# Patient Record
Sex: Female | Born: 1975 | Race: White | Hispanic: No | Marital: Married | Smoking: Never smoker
Health system: Southern US, Community
[De-identification: ages and names within clinical notes are randomized; demographics above are authoritative.]

## PROBLEM LIST (undated history)

## (undated) DIAGNOSIS — R55 Syncope and collapse: Secondary | ICD-10-CM

## (undated) DIAGNOSIS — M549 Dorsalgia, unspecified: Secondary | ICD-10-CM

## (undated) DIAGNOSIS — N189 Chronic kidney disease, unspecified: Secondary | ICD-10-CM

## (undated) DIAGNOSIS — Z9889 Other specified postprocedural states: Secondary | ICD-10-CM

## (undated) DIAGNOSIS — N135 Crossing vessel and stricture of ureter without hydronephrosis: Secondary | ICD-10-CM

## (undated) DIAGNOSIS — Z8042 Family history of malignant neoplasm of prostate: Secondary | ICD-10-CM

## (undated) DIAGNOSIS — Z803 Family history of malignant neoplasm of breast: Secondary | ICD-10-CM

## (undated) DIAGNOSIS — F410 Panic disorder [episodic paroxysmal anxiety] without agoraphobia: Secondary | ICD-10-CM

## (undated) DIAGNOSIS — Z923 Personal history of irradiation: Secondary | ICD-10-CM

## (undated) DIAGNOSIS — Z801 Family history of malignant neoplasm of trachea, bronchus and lung: Secondary | ICD-10-CM

## (undated) DIAGNOSIS — T8859XA Other complications of anesthesia, initial encounter: Secondary | ICD-10-CM

## (undated) DIAGNOSIS — R002 Palpitations: Secondary | ICD-10-CM

## (undated) DIAGNOSIS — R000 Tachycardia, unspecified: Secondary | ICD-10-CM

## (undated) DIAGNOSIS — Z800 Family history of malignant neoplasm of digestive organs: Secondary | ICD-10-CM

## (undated) DIAGNOSIS — O1495 Unspecified pre-eclampsia, complicating the puerperium: Secondary | ICD-10-CM

## (undated) DIAGNOSIS — Z8051 Family history of malignant neoplasm of kidney: Secondary | ICD-10-CM

## (undated) HISTORY — DX: Personal history of irradiation: Z92.3

## (undated) HISTORY — DX: Crossing vessel and stricture of ureter without hydronephrosis: N13.5

## (undated) HISTORY — DX: Family history of malignant neoplasm of trachea, bronchus and lung: Z80.1

## (undated) HISTORY — PX: WISDOM TOOTH EXTRACTION: SHX21

## (undated) HISTORY — DX: Family history of malignant neoplasm of prostate: Z80.42

## (undated) HISTORY — DX: Dorsalgia, unspecified: M54.9

## (undated) HISTORY — DX: Family history of malignant neoplasm of breast: Z80.3

## (undated) HISTORY — DX: Tachycardia, unspecified: R00.0

## (undated) HISTORY — DX: Family history of malignant neoplasm of digestive organs: Z80.0

## (undated) HISTORY — DX: Panic disorder (episodic paroxysmal anxiety): F41.0

## (undated) HISTORY — DX: Palpitations: R00.2

## (undated) HISTORY — DX: Family history of malignant neoplasm of kidney: Z80.51

## (undated) HISTORY — DX: Syncope and collapse: R55

## (undated) HISTORY — DX: Unspecified pre-eclampsia, complicating the puerperium: O14.95

---

## 1998-11-11 ENCOUNTER — Ambulatory Visit (HOSPITAL_COMMUNITY): Admission: RE | Admit: 1998-11-11 | Discharge: 1998-11-11 | Payer: Self-pay | Admitting: Internal Medicine

## 1998-11-11 ENCOUNTER — Encounter: Payer: Self-pay | Admitting: Internal Medicine

## 1999-03-16 ENCOUNTER — Other Ambulatory Visit: Admission: RE | Admit: 1999-03-16 | Discharge: 1999-03-16 | Payer: Self-pay | Admitting: *Deleted

## 1999-08-14 ENCOUNTER — Ambulatory Visit (HOSPITAL_COMMUNITY): Admission: RE | Admit: 1999-08-14 | Discharge: 1999-08-14 | Payer: Self-pay | Admitting: Neurology

## 1999-08-14 ENCOUNTER — Encounter: Payer: Self-pay | Admitting: Neurology

## 2000-05-13 ENCOUNTER — Other Ambulatory Visit: Admission: RE | Admit: 2000-05-13 | Discharge: 2000-05-13 | Payer: Self-pay | Admitting: Obstetrics & Gynecology

## 2000-12-13 ENCOUNTER — Ambulatory Visit (HOSPITAL_COMMUNITY): Admission: RE | Admit: 2000-12-13 | Discharge: 2000-12-13 | Payer: Self-pay | Admitting: Internal Medicine

## 2000-12-13 ENCOUNTER — Encounter: Payer: Self-pay | Admitting: Internal Medicine

## 2001-08-01 ENCOUNTER — Other Ambulatory Visit: Admission: RE | Admit: 2001-08-01 | Discharge: 2001-08-01 | Payer: Self-pay | Admitting: Obstetrics and Gynecology

## 2002-10-26 ENCOUNTER — Other Ambulatory Visit: Admission: RE | Admit: 2002-10-26 | Discharge: 2002-10-26 | Payer: Self-pay | Admitting: Obstetrics and Gynecology

## 2003-10-05 ENCOUNTER — Encounter: Admission: RE | Admit: 2003-10-05 | Discharge: 2003-10-05 | Payer: Self-pay | Admitting: Internal Medicine

## 2003-10-27 ENCOUNTER — Other Ambulatory Visit: Admission: RE | Admit: 2003-10-27 | Discharge: 2003-10-27 | Payer: Self-pay | Admitting: Obstetrics and Gynecology

## 2004-10-30 ENCOUNTER — Other Ambulatory Visit: Admission: RE | Admit: 2004-10-30 | Discharge: 2004-10-30 | Payer: Self-pay | Admitting: Obstetrics and Gynecology

## 2004-12-22 ENCOUNTER — Ambulatory Visit: Payer: Self-pay | Admitting: Internal Medicine

## 2005-01-01 HISTORY — PX: US ECHOCARDIOGRAPHY: HXRAD669

## 2005-02-13 ENCOUNTER — Encounter: Admission: RE | Admit: 2005-02-13 | Discharge: 2005-02-13 | Payer: Self-pay | Admitting: Obstetrics and Gynecology

## 2005-12-21 ENCOUNTER — Other Ambulatory Visit: Admission: RE | Admit: 2005-12-21 | Discharge: 2005-12-21 | Payer: Self-pay | Admitting: Obstetrics and Gynecology

## 2007-03-21 ENCOUNTER — Encounter: Admission: RE | Admit: 2007-03-21 | Discharge: 2007-03-21 | Payer: Self-pay | Admitting: Internal Medicine

## 2008-02-22 ENCOUNTER — Inpatient Hospital Stay (HOSPITAL_COMMUNITY): Admission: AD | Admit: 2008-02-22 | Discharge: 2008-02-22 | Payer: Self-pay | Admitting: Obstetrics and Gynecology

## 2008-03-25 ENCOUNTER — Inpatient Hospital Stay (HOSPITAL_COMMUNITY): Admission: AD | Admit: 2008-03-25 | Discharge: 2008-03-27 | Payer: Self-pay | Admitting: Obstetrics and Gynecology

## 2008-04-15 ENCOUNTER — Inpatient Hospital Stay (HOSPITAL_COMMUNITY): Admission: AD | Admit: 2008-04-15 | Discharge: 2008-04-18 | Payer: Self-pay | Admitting: Obstetrics and Gynecology

## 2008-04-19 ENCOUNTER — Inpatient Hospital Stay (HOSPITAL_COMMUNITY): Admission: AD | Admit: 2008-04-19 | Discharge: 2008-04-22 | Payer: Self-pay | Admitting: Obstetrics and Gynecology

## 2008-05-05 ENCOUNTER — Ambulatory Visit (HOSPITAL_BASED_OUTPATIENT_CLINIC_OR_DEPARTMENT_OTHER): Admission: RE | Admit: 2008-05-05 | Discharge: 2008-05-05 | Payer: Self-pay | Admitting: Urology

## 2008-05-07 ENCOUNTER — Ambulatory Visit: Admission: RE | Admit: 2008-05-07 | Discharge: 2008-05-07 | Payer: Self-pay | Admitting: Obstetrics and Gynecology

## 2008-05-12 ENCOUNTER — Ambulatory Visit (HOSPITAL_COMMUNITY): Admission: RE | Admit: 2008-05-12 | Discharge: 2008-05-12 | Payer: Self-pay | Admitting: Urology

## 2008-05-26 HISTORY — PX: KIDNEY SURGERY: SHX687

## 2008-06-24 ENCOUNTER — Inpatient Hospital Stay (HOSPITAL_COMMUNITY): Admission: RE | Admit: 2008-06-24 | Discharge: 2008-06-26 | Payer: Self-pay | Admitting: Urology

## 2008-06-24 ENCOUNTER — Encounter (INDEPENDENT_AMBULATORY_CARE_PROVIDER_SITE_OTHER): Payer: Self-pay | Admitting: Urology

## 2008-09-27 ENCOUNTER — Ambulatory Visit (HOSPITAL_COMMUNITY): Admission: RE | Admit: 2008-09-27 | Discharge: 2008-09-27 | Payer: Self-pay | Admitting: Urology

## 2009-06-20 ENCOUNTER — Ambulatory Visit (HOSPITAL_COMMUNITY): Admission: RE | Admit: 2009-06-20 | Discharge: 2009-06-20 | Payer: Self-pay | Admitting: Urology

## 2009-08-27 ENCOUNTER — Ambulatory Visit: Payer: Self-pay | Admitting: Family Medicine

## 2009-08-27 DIAGNOSIS — J0100 Acute maxillary sinusitis, unspecified: Secondary | ICD-10-CM | POA: Insufficient documentation

## 2010-06-28 ENCOUNTER — Ambulatory Visit (HOSPITAL_COMMUNITY): Admission: RE | Admit: 2010-06-28 | Discharge: 2010-06-28 | Payer: Self-pay | Admitting: Urology

## 2010-12-18 ENCOUNTER — Encounter: Payer: Self-pay | Admitting: Urology

## 2011-04-10 NOTE — Discharge Summary (Signed)
Destiny Mitchell, Destiny Mitchell              ACCOUNT NO.:  [id number]   MEDICAL RECORD NO.:  [id number]          PATIENT TYPE:  INP   LOCATION:  9317                          FACILITY:  [facility]   PHYSICIAN:  Zenaida Niece, M.D.DATE OF BIRTH:  [birth year]   DATE OF ADMISSION:  04/15/2008  DATE OF DISCHARGE:  04/18/2008                               DISCHARGE SUMMARY   ADMISSION DIAGNOSES:  1. Intrauterine pregnancy at 34+ weeks'.  2. Gestational hypertension.   DISCHARGE DIAGNOSES:  1. Intrauterine pregnancy at 34+ weeks'.  2. Gestational hypertension.   PROCEDURES:  On Apr 16, 2008, she had a spontaneous vaginal delivery.   HISTORY AND PHYSICAL:  This is a 35 year old white female gravida 1,  para 0 with an EGA of 34+ weeks, who was seen in the office on the day  of admission and had slightly elevated blood pressures without  significant symptoms and no proteinuria.  She was evaluated in triage  and blood pressures remained mildly elevated 150s/110s.  Labs were  normal.  Dr. Ellyn Hack evaluated the patient and recommended induction.  Prenatal care has been complicated by preterm contractions for which she  has been treated with p.r.n. terbutaline and received betamethasone at  the end of April and early May.  Prenatal labs blood type is A+ with a  negative antibody screen, gonorrhea and chlamydia negative, RPR  nonreactive, rubella immune, hepatitis B surface antigen negative, HIV  negative, 1-hour Glucola 99, quad screen is normal, cystic fibrosis  negative, group B strep is negative.   PAST MEDICAL HISTORY:  Significant for panic attacks and syncope.   PAST SURGICAL HISTORY:  Wisdom tooth removal.  The remainder of the  history is noncontributory.   PHYSICAL EXAMINATION:  Blood pressure is 150/110.  The remainder of the  vitals were stable.  Fetal heart tracing is 160s and reactive.  Abdomen,  gravid, nontender.  Cervix is 4-5, 80, 0 to -1, and Dr. Ellyn Hack was able  to rupture membranes  which revealed clear fluid.   HOSPITAL COURSE:  The patient was admitted by Dr. Ellyn Hack and had  membranes ruptured for induction.  She progressed into labor, progressed  to complete, pushed well, and early on the morning of Apr 16, 2008, had  a vaginal delivery of a viable female infant with Apgars of  8 and 9 that  weighed 5 pounds 9 ounces.  The NICU team was in attendance due to  prematurity.  Placenta delivered spontaneously and was intact.  She had  a first-degree perineal laceration repaired with 3-0 Vicryl and  estimated blood loss was less than 500 mL.  Postpartum, she had no  significant complications.  Blood pressures remained mildly elevated but  did gradually improve.  Predelivery hemoglobin 13.1, postdelivery 12.4.  Predelivery platelets 124,000, postdelivery 197,000.  On postpartum #2,  she was felt to be stable enough for discharge home.  Her baby was in  the NICU and doing well.   DISCHARGE INSTRUCTIONS:  Regular diet, pelvic rest, follow-up is in 6  days to check her blood pressure.  Medications are over-the-counter  ibuprofen as needed and  she is given our discharge pamphlet.      Zenaida Niece, M.D.  Electronically Signed     TDM/MEDQ  D:  04/18/2008  T:  04/18/2008  Job:  [id number]

## 2011-04-10 NOTE — Discharge Summary (Signed)
NAMELAIBA, FUERTE NO.:  [id number]   MEDICAL RECORD NO.:  [id number]          PATIENT TYPE:  INP   LOCATION:  9317                          FACILITY:  [facility]   PHYSICIAN:  Malachi Pro. Ambrose Mantle, M.D. DATE OF BIRTH:  1976-01-26   DATE OF ADMISSION:  04/15/2008  DATE OF DISCHARGE:  04/18/2008                               DISCHARGE SUMMARY   This is a 35 year old white married female, para 0-1-0-1, admitted with  preeclampsia.  This lady was admitted Apr 15, 2008, with increased blood  pressure, mild headache, and had labor induced.  She delivered a 5-pound  9-ounce female infant.  Labor did not include magnesium sulfate.  Labs  were normal.  SGOT and PT were 20 and 19 respectively.  Postpartum, she  did well and was discharged on postpartum day #2 with blood pressures  130-150/80-100.  On the afternoon of admission, she awoke from a nap and  had some frontal headache and some pain in her neck.  Her blood pressure  was 152/109, so she came to maternity admission for evaluation.  In the  MAU, her blood pressures were 152/102, 172/111, 160/95, 157/107, and  155/97.  Cath urine was negative for protein, platelet was 270,000, uric  acid was 5.7, SGOT and PT was 61 and 67 respectively.   PAST MEDICAL HISTORY:  No known allergies.  No operations, illnesses,  panic attacks, or syncope.   FAMILY HISTORY:  Father with an MI and high blood pressure.  Mother with  kidney and lung cancer, anxiety and depression.  Alcohol, tobacco, and  drugs none.   PHYSICAL EXAMINATION:  Her vital signs are temperature 98.1, blood  pressures as recorded, pulse 92, respirations 16.  HEART:  Normal sinus rhythm, no murmurs.  LUNGS: Clear to auscultation.  ABDOMEN:  Soft.  There was a mass below the left costal margin.  Fundus  was about 8 cm above the pubis.  LEGS:  Negative.  Deep deep tendon reflexes at the knees were 3+ with  possibly 1-2 beats of clonus.  PELVIC:  Deferred.   Impressions  on admission were postpartum preeclampsia, left upper  quadrant mass.  The patient was placed on intravenous magnesium sulfate.  An abdominal ultrasound was ordered and we would treat with labetalol if  the blood pressure did not come down.  The nurse noted extremely brisk  reflexes at approximately 1 a.m. on Apr 20, 2008.  An additional 2 g  bolus of magnesium sulfate was given and the magnesium sulfate was  continued 2 g an hour.  On the morning of Apr 20, 2008, the magnesium  level was 7.6, SGOT and PT were 58 and 71 respectively.  Platelets were  261, uric acid 6.3.  Labetalol 100 mg twice a day was started.  Abdominal ultrasound showed a liver and spleen normal.  The left kidney  was hydronephrotic.  Largest cyst was 8 cm compatible with the  ureteropelvic junction obstruction.  I asked Dr. Isabel Caprice to see the  patient.  He asked me to get a CT scan of the abdomen and pelvis.  The  patient tolerated the magnesium sulfate relatively poorly seeing double  and some nausea and vomiting.  Her blood pressures did markedly improve  with the magnesium sulfate and labetalol.  Her magnesium sulfate was  decreased to 1 g an hour.  Dr. Isabel Caprice saw the patient, said she had a  left hydronephrosis with a ureteropelvic junction obstruction.  He  recommended that after discharge she undergo a retrograde pyelogram with  insertion of a double-J stent followed by renal scan to check the  function.  He stated that he would arrange for it to be done next week.  On Apr 21, 2008, the patient's SGOT was 35, SGPT was 55.  All blood  pressures were normal and the mag level was 7.0.  Magnesium sulfate was  discontinued and she was transferred to the floor.  Thereafter, her  blood pressures remained slightly elevated.  The highest diastolic was  in the mid 90s.  Some of them were completely normal.  The patient  requests a final PIH panel prior to discharge.  The CT scan of the  abdomen with contrast showed massive  hydronephrosis on the left of  uncertain etiology.  It had developed since March 21, 2007, and she had  a CT scan.  No obstructing calculus or abdominal masses were identified.  The obstruction did not extend into the left ureter.  Initial hemoglobin  12.6, hematocrit 36.1, white count 10,500, platelet count 270,000.  Follow-up CBCs were in the same range.  Creatinines were 0.89, 0.94, and  0.9.  Estimated glomerular filtration rate was greater than 60 on all  determinations.  SGOT was 61, 58, and 35; SGPT 67, 71, and 55.  Magnesium levels were 6.9, 7.6, 7.2, and 7.0.  Urinalysis was negative  for protein, pH was 6.5, and specific gravity of 1.010.   FINAL DIAGNOSES:  1. Postpartum preeclampsia.  2. Left renal hydronephrosis, probably secondary to ureteropelvic      junction obstruction.   DISCHARGE MEDICATIONS:  Labetalol 100 mg twice a day.  The patient is  advised to take her blood pressure twice daily in the afternoon and in  the evening, report any significant elevation for more than 150/100, at  which time if it does go higher, we would repeat her labs and increase  her labetalol.  She is to follow up with Dr. Isabel Caprice regarding the  ureteropelvic junction obstruction and come back to our office in two  weeks for followup examination.      Malachi Pro. Ambrose Mantle, M.D.  Electronically Signed     TFH/MEDQ  D:  04/22/2008  T:  04/22/2008  Job:  [id number]   cc:   Valetta Fuller, M.D.  Fax: [phone]

## 2011-04-10 NOTE — Op Note (Signed)
Destiny Mitchell, CERINO NO.:  [id number]   MEDICAL RECORD NO.:  [id number]           PATIENT TYPE:   LOCATION:                                 FACILITY:   PHYSICIAN:  Valetta Fuller, M.D.  DATE OF BIRTH:  04-30-1976   DATE OF PROCEDURE:  DATE OF DISCHARGE:                               OPERATIVE REPORT   PREOPERATIVE DIAGNOSES:  1. Left hydronephrosis.  2. Left UPJ (ureteropelvic junction) obstruction.   POSTOPERATIVE DIAGNOSES:  1. Left hydronephrosis.  2. Left UPJ (ureteropelvic junction) obstruction.   PROCEDURE PERFORMED:  Cystoscopy, left retrograde pyelogram, left  ureteroscopy and left double J stent placement.   SURGEON:  Valetta Fuller, M.D.   ANESTHESIA:  General.   INDICATIONS FOR PROCEDURE:  Ms. Destiny Mitchell is a 35 year old female.  She was  admitted recently to [facility] with some preeclampsia.  She had  had a headache after she had been induced for delivery.  As part of her  assessment she underwent physical exam which revealed a fullness/mass in  the left upper quadrant of her abdomen.  An ultrasound showed a markedly  swollen left kidney with severe hydronephrosis.  Renal function was  normal.  The patient had no abdominal or flank pain.  The patient  subsequently had a CT of the abdomen and pelvis.  This showed a poorly  functioning left renal unit with a markedly dilated pelvis and calyceal  system and what appeared to be a normal ureter suggesting ureteropelvic  junction obstruction.  Our plan was to allow her situation to stabilize  and then as an outpatient bring her in for further assessment of this  and she presents now for that.  She appeared to understand the  advantages and disadvantages of that.  Her blood pressure has been under  good control on labetalol.   TECHNIQUE AND FINDINGS:  The patient was brought to the operating room  where she had successful induction of general anesthesia.  She was  placed in lithotomy position  and prepped and draped in the usual manner.  Cystoscopically the bladder was unremarkable.  She had a retrograde  pyelogram performed of the left renal unit.  This showed a normal  caliber ureter up to the level of ureteropelvic junction where there was  marked high-grade obstruction.  One could see a jet effect of contrast  entering a markedly dilated renal pelvis with what appeared to be a high  insertion UPJ obstruction.  A guidewire was able to be placed past this  into the dilated renal pelvis.  We then performed ureteroscopy to rule  out any intrinsic process.  We got all the way up to the ureteropelvic  junction where there appeared to be marked narrowing and significant  stenosis.  We were unable to advance the scope beyond this  point.  I saw no evidence of stone, tumor or other pathology.  Over the  guidewire we then placed a 7 French 24 cm stent which was confirmed to  be in good position.  There were no obvious complications or problems.  The patient appeared to  tolerate the procedure well and was brought to  the recovery room in stable condition.           ______________________________  Valetta Fuller, M.D.  Electronically Signed     DSG/MEDQ  D:  05/05/2008  T:  05/05/2008  Job:  [id number]   cc:   Malachi Pro. Ambrose Mantle, M.D.  Fax: [phone]

## 2011-04-10 NOTE — Op Note (Signed)
NAMEBERNEDETTE, Destiny Mitchell NO.:  [id number]   MEDICAL RECORD NO.:  [id number]          PATIENT TYPE:  INP   LOCATION:  1405                         FACILITY:  [facility]   PHYSICIAN:  Heloise Purpura, MD      DATE OF BIRTH:  [birth year]   DATE OF PROCEDURE:  06/24/2008  DATE OF DISCHARGE:                               OPERATIVE REPORT   PREOPERATIVE DIAGNOSIS:  Left ureteropelvic junction obstruction.   POSTOPERATIVE DIAGNOSIS:  Left ureteropelvic junction obstruction.   PROCEDURE:  1. Cystoscopy.  2. Left retrograde pyelography.  3. Left ureteral stent placement (8 x 26).  4. Left robotic-assisted laparoscopic dismembered pyeloplasty.   SURGEON:  Dr. Heloise Purpura.   ASSISTANT:  Dr. Delman Kitten.   ANESTHESIA:  General.   COMPLICATIONS:  None.   ESTIMATED BLOOD LOSS:  50 mL.   SPECIMEN:  Left ureteropelvic junction.   DISPOSITION OF SPECIMENS:  To pathology.   DRAINS:  1. A #15 Blake perinephric drain.  2. A 16-French Foley catheter.   INDICATIONS:  Ms. Davidow the 35 year old female who was incidentally  found to have left-sided hydronephrosis on an imaging study.  She was  subsequently evaluated by Dr. Isabel Caprice and found to have a left  ureteropelvic junction obstruction with significantly decreased left  relative renal function.  She did appear to have enough function that  would indicate renal salvage, and after discussion regarding management  options for treatment, she elected to proceed with the above procedures.  Potential risks, complications, and alternative options associated with  the above procedures were discussed with the patient in detail, and  informed consent was obtained.   DESCRIPTION OF PROCEDURE:  The patient was taken to the operating room,  and a general anesthetic was administered.  She was given preoperative  antibiotics, placed in the dorsal lithotomy position, prepped and draped  in the usual sterile fashion.  Next, a preoperative  time-out was  performed.  Cystourethroscopy was performed which demonstrated no  abnormalities within the urethra or bladder.  The ureteral orifices were  in their normal anatomic position.  The left ureteral orifice was then  identified and intubated with a 6-French ureteral catheter.  Contrast  was injected which demonstrated a normal caliber left ureter without  evidence for filling defects.  No contrast was seen to enter the renal  pelvis, however.  A 0.038 sensor guidewire was then advanced up the  ureter under fluoroscopic guidance and was easily advanced into the  renal pelvis.  The ureteral catheter was then advanced over the wire up  to the proximal ureter, and contrast was again injected.  This  demonstrated significant obstruction with very little contrast entering  the renal pelvis.  The wire was then again reinserted up into the renal  pelvis, and the ureteral catheter was advanced over the wire into the  renal pelvis, and contrast was injected to fill out the renal pelvis.  This demonstrated a significantly dilated left renal pelvis with an  abrupt and clear obstruction at the level of the UPJ consistent with her  diagnosis of a ureteropelvic junction obstruction.  It was felt that  based on the images a crossing vessel could be an extrinsic cause of  obstruction.  At this point, the 0.038 sensor guidewire was re-advanced  into the renal pelvis, and the ureteral catheter was removed.  An 8 x 26  double-J ureteral stent was then advanced over the wire using Seldinger  technique and appropriately positioned under fluoroscopic and  cystoscopic guidance.  The wire was removed with a good curl noted in  the renal pelvis as well as in the bladder.  A 16-French Foley catheter  was then placed.  The patient was then repositioned in the left modified  flank position with care to pad all potential pressure points.  Another  preoperative time-out was performed, and the patient's abdomen  was  prepped and draped in the usual sterile fashion.  A site was selected  just superior to the umbilicus in the midline for placement of the  camera port.  This was placed using a standard open Hassan technique  which allowed entry into the peritoneal cavity under direct vision  without difficulty.  A 12-mm port was then placed, and a  pneumoperitoneum was established.  The abdomen was inspected with a 0  degrees lens, and there was no evidence for any intra-abdominal injuries  or other abnormalities.  The 8-mm robotic ports were then placed in the  left upper quadrant, left lower quadrant, and far lateral left lower  quadrant.  An additional 12-mm port was placed in the lower midline for  laparoscopic assistance.  The surgical cart was then docked.  With the  aid of the cautery scissors, the white line of Toldt was incised along  the length of the descending colon allowing the colon to be mobilized  medially and exposing the space between the mesocolon and anterior layer  of Gerota's fascia.  The patient was noted to have very little  mesenteric as well as retroperitoneal fat.  During this dissection, a  small inadvertent mesenteric window was created, and this was repaired  with a running 4-0 Vicryl suture.  Once the retroperitoneum was exposed,  the gonadal vein and ureter were identified.  The ureter was lifted off  the psoas muscle, and there was noted to be a dense desmoplastic  reaction surrounding the ureter and subsequently the renal pelvis.  The  ureter was carefully dissected free with care to preserve the  periureteral blood supply and dissection proceeded superiorly.  A clear  and obvious crossing renal artery and vein were identified extending  toward the lower pole of the kidney.  The kidney itself was noted to be  markedly abnormal with a significantly dilated and floppy renal pelvis.  With a combination of sharp, blunt, and cautery dissection, the renal  pelvis was  carefully separated from the surrounding tissues until it was  isolated up to the level of the main renal hilum.  It was completely  freed posteriorly as well as anteriorly and medially, allowing clear  identification of the patient's anatomical variant.  The crossing renal  vessels were separated from the collecting system and ureter, and at  this point, the ureter was divided just below the level of the UPJ.  The  ureter was then spatulated on its lateral side, and the ureteral stent  was removed from the renal pelvis.  After the ureter was completely  disarticulated from the renal pelvis, the renal pelvis was examined.  A  segment of the ureteropelvic junction that did appear to be  significantly narrowed was removed and sent for pathologic analysis.  The renal pelvis was then inspected, and approximately the medial half  of the renal pelvis was excised with incisions made anteriorly and  posteriorly.  During this part of the procedure, there was noted to be a  widely dilated renal pelvis.  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  It was noted that with excision of the renal pelvis  that primary closure would result in obstruction of his upper caliceal  system.  Therefore, after the segment of the renal pelvis was removed,  an incision was made to connect this upper pole caliceal system into the  main renal pelvis with the areas that were incised, then reapproximated  with running 4-0 Vicryl sutures on the internal surface of the renal  pelvis.  This resulted in a widely patent communication between the two  systems.  The main renal pelvis was then closed with a running 4-0  Vicryl suture down into the medial aspect.  In the dependent portion of  the renal pelvis, the ureter was then secured in the correct anatomical   position with a single 4-0 Vicryl reapproximation sutures medially and  laterally.  Four-0 Vicryl running sutures were then used to close the  anterior and posterior portions of the ureteropelvic anastomosis.  Prior  to completion of the anastomosis, the ureteral stent was repositioned  back in the renal pelvis.  Indigo carmine was administered and was  identified within the urine without evidence for leakage from the  anastomotic site or renal pelvic closure suture line.  A #15 Blake drain  was brought in through the fourth arm robotic port site and positioned  in the perinephric space.  It was secured to the skin with a nylon  suture.  The colon was placed back into its normal anatomic position  overlying the left kidney.  The 12-mm port sites were closed with 0  Vicryl fascial sutures.  The remaining ports were removed under direct  vision.  The pneumoperitoneum was expelled.  Quarter percent Marcaine  was then used to anesthetize the port sites, and the skin was  reapproximated with 4-0 Vicryl subcuticular closures.  Dermabond was  applied to the skin.  The patient tolerated the procedure well and  without complications.  She was able to be extubated and transferred to  the recovery unit in satisfactory condition.      Heloise Purpura, MD  Electronically Signed     LB/MEDQ  D:  06/24/2008  T:  06/24/2008  Job:  [id number]

## 2011-04-13 NOTE — Consult Note (Signed)
Destiny Mitchell, Destiny Mitchell NO.:  [id number]   MEDICAL RECORD NO.:  [id number]          PATIENT TYPE:  INP   LOCATION:  9319                          FACILITY:  [facility]   PHYSICIAN:  Valetta Fuller, M.D.  DATE OF BIRTH:  05/06/1976   DATE OF CONSULTATION:  04/21/2007  DATE OF DISCHARGE:  04/22/2008                                 CONSULTATION   REASON FOR CONSULTATION:  Severe incidentally noted left hydronephrosis.   HISTORY OF PRESENT ILLNESS:  Ms. Linan is a 35 year old female.  She  had been admitted acutely to the hospital on Apr 15, 2008, at  approximately 34 weeks of pregnancy.  She had had elevated blood  pressure without significant proteinuria.  The patient therefore was  induced.  She was felt to have gestational hypertension.  She was  eventually discharged from [facility], but then readmitted because  of ongoing hypertension issues and has been admitted to the Intensive  Care Unit.  She is receiving magnesium and has been carefully monitored,  but otherwise is clinically doing well.  During her repeat admission,  abdominal palpation had revealed concerns over an abdominal mass in her  left upper quadrant.  The patient has an ultrasound, which showed  substantial left hydronephrosis.  The patient subsequently had a CT of  her abdomen with and without contrast.  This showed massive left-sided  hydronephrosis.  The right kidney was unremarkable.  The ureter was not  dilated and therefore was felt that the patient probably had some degree  of a ureteropelvic junction obstruction.  Interestingly, she had had a  CT scan in April of 2008, which had shown no hydronephrosis and  therefore this was a new finding.  By the same token, there was a quite  a bit of parenchymal atrophy suggesting that this certainly was not a  acute process, but it had been at least relatively long-standing.  She  has had no flank or abdominal discomfort.  No voiding complaints.  No  gross hematuria.  Urinalysis has been clear and overall systemic renal  function has been normal with a creatinine of 0.9.  She really has no  other urologic complaints at this time.   PAST MEDICAL HISTORY:  Really is otherwise unremarkable.   Her current medications are reviewed of the medical record.  Most  significant at this time for her magnesium drip.  She has had no other  systemic illnesses, but has had some episodes of syncope in the past.  Her surgical history is otherwise unremarkable.   PHYSICAL EXAMINATION:  GENERAL:  She is well-developed and well-  nourished female.  She is currently afebrile.  VITAL SIGNS:  Her blood pressures have been in the 140/90 range.  NECK:  No obvious masses or JVD.  LUNGS:  Respiratory effort is normal.  HEART:  Regular rate and rhythm.  ABDOMEN:  Protuberant but soft.  She has no CVA tenderness.  There is  fullness with a questionable palpable mass within the left upper  quadrant.  Really, no tenderness.  EXTREMITIES:  Without edema.   DATA:  As listed  above.   ASSESSMENT:  She has massive left hydronephrosis.  The etiology of this  remains unclear at this point.  There is no evidence of obvious stone.  Presumptively, given the massive dilation of the renal pelvis with no  dilation of the ureter were dealing with an underlying ureteropelvic  junction obstruction, which in that sense would be potentially  congenital.  Again, it is somewhat puzzling that she had a completely  normal CT of the kidney approximately a year ago.  Given the parenchymal  atrophy, there is no question that this has been a process that has had  been going on at least 6 months or more.  She definitely is going to  require additional assessment.  This would include cystoscopy with  retrograde pyelogram to confirm that the obstruction is indeed at the  level of the ureteropelvic junction.  We will plan on placement of a  double-J stent to decompress that kidney and  also preserve what function  may be remaining.  Once decompression has occurred, she will need a  Lasix renogram to determine the function of that kidney.  If the kidney  shows some minimal salvageable function, then either observation or  nephrectomy could be considered depending on how she does clinically and  whether we feel there is any contribution of this kidney to her  hypertension, which I think is unlikely.  If there is salvageable  function, and we can confirm an anatomic ureteropelvic junction  obstruction, then consideration for treatment options will be given to  her.  This could include potentially endoscopic approaches versus an  open or laparoscopic dismembered pyeloplasty.  She may need additional  imaging studies to rule out crossing vessel.  There is no urgent need to  do this, but was certainly do not want to delay for a long period of  time.  I suspect it is going to be most important that she get her  hypertension under control and have a chance to recover from the recent  delivery.  I think it would be appropriate to plan an outpatient surgery  in the next 1-2 weeks and certainly sooner if her clinical situation  changes.           ______________________________  Valetta Fuller, M.D.  Electronically Signed     DSG/MEDQ  D:  04/22/2008  T:  04/23/2008  Job:  [id number]   cc:   Malachi Pro. Ambrose Mantle, M.D.  Fax: [phone]

## 2011-04-13 NOTE — Discharge Summary (Signed)
Destiny Mitchell, Destiny Mitchell NO.:  [id number]   MEDICAL RECORD NO.:  [id number]          PATIENT TYPE:  INP   LOCATION:  9319                          [facility]ACILITY:  [facility]   PHYSICIAN:  Malachi Pro. Ambrose Mantle, M.D. DATE OF BIRTH:  [birth year]   DATE OF ADMISSION:  04/19/2008  DATE OF DISCHARGE:  04/22/2008                               DISCHARGE SUMMARY   HISTORY OF PRESENT ILLNESS:  This is a 35 year old white married female  para 0-1-0-1 admitted with preeclampsia.  This lady was admitted Apr 15, 2008, with increased blood pressure, mild headache, and had labor-  induced.  She delivered a 5 pounds 9 ounces female infant.  Labor did not  include magnesium sulfate.  Labs were normal.  SGOT and PT were 20 and  19 respectfully.  Postpartum, she did well and was discharged on  postpartum day #2 with blood pressures in the 130-150/80-100 range.  On  the afternoon of admission, she awoke from a nap and had some frontal  headache and some pain in her neck, her blood pressure 152/109, so she  came to the Maternity Admission Unit for evaluation.  Her blood  pressures there were 152/102, 172/111, 160/95, 157/107, and 155/97.  Cath urine was negative for protein, platelets 270,000, uric acid 5.7,  SGOT and PT was 61 and 67 respectively.   PAST MEDICAL HISTORY:   ALLERGIES:  No known allergies.   OPERATIONS:  No operations.   ILLNESSES:  Panic attacks and syncope.   FAMILY HISTORY:  Father with an MI and high blood pressure.  Mother with  kidney and lung cancer, anxiety, and depression.   SOCIAL HISTORY:  Alcohol, tobacco, and drugs none.   PHYSICAL EXAMINATION:  VITAL SIGNS:  On admission temperature 98.1,  blood pressure 152/100, pulse 92, and respiration 16.  HEART:  Heart normal size and sounds.  No murmurs.  LUNGS: Clear to auscultation.  ABDOMEN:  Soft.  There was a mass below the left costal margin.  The  fundal height was 8 cm above the pubis.  Extremities:  Legs were  negative.  DTRs were 3+ possible 1-2 beats of  clonus.  PELVIC:  Deferred.   ADMITTING IMPRESSION:  Postpartum preeclampsia, left upper quadrant  mass.  The patient was placed on IV magnesium sulfate.  Abdominal  ultrasound was ordered.  The patient's reflexes became even more brisk,  so additional 2 grams of magnesium sulfate bolus was given and magnesium  was continued at 2 grams an hour.  On the morning after admission, her  magnesium level was 7.6, SGOT and PT were 58 and 71 respectively,  platelets of 261,000, and uric acid 6.3.  labetalol 100 mg twice a day  was started.  Abdominal ultrasound showed the liver and spleen to be  normal.  Left kidney was hydronephrotic, largest cyst was 8 cm  compatible with the ureteropelvic junction obstruction.  I asked Dr.  Isabel Caprice to see the patient.  The patient was seen by Dr. Isabel Caprice who felt  she should need a CT scan.  On the second day her SGOT was 35, PT  was  55, blood pressures were normal, and magnesium level was 7.  On Apr 22, 2008, the patient was ready for discharge.   LABORATORY DATA:  Initial hemoglobin of 12.6, hematocrit 36.1, white  count 10,500, and platelet count 270,000.  Followups were essentially  unchanged.  Comprehensive metabolic profiles all showed estimated  glomerular filtration rate of greater than 60, SGOT and PT was 61 and  67, 58 and 71, 35 and 55, and 24 and 42.  Urinalysis showed no protein.  CT scan of the abdomen showed massive hydronephrosis on the left of  uncertain etiology.  It had developed since March 21, 2007, when a CT  scan showed no obstructing calculus or abdominal mass.  The obstruction  did not extend into the left ureter.  Abdominal ultrasound showed normal  gallbladder, no biliary duct dilatation, and marked left hydronephrosis  compatible with the ureteropelvic junction obstruction.   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   OPERATIONS:  None.   FINAL CONDITION:  Improved.   DISCHARGE INSTRUCTIONS:  Include regular diet.  Continue postpartum  instructions.  Labetalol 100 mg by mouth twice a day.  The patient was  advised to follow up with Dr. Isabel Caprice and return to our office in 2 weeks  for followup examination.      Malachi Pro. Ambrose Mantle, M.D.  Electronically Signed     TFH/MEDQ  D:  06/01/2008  T:  06/01/2008  Job:  [id number]   cc:   Valetta Fuller, M.D.  Fax: [phone]

## 2011-04-13 NOTE — Discharge Summary (Signed)
NAMEDOYLE, TEGETHOFF NO.:  [id number]   MEDICAL RECORD NO.:  [id number]          PATIENT TYPE:  INP   LOCATION:  9173                          [facility]ACILITY:  [facility]   PHYSICIAN:  Huel Cote, M.D. DATE OF BIRTH:  04-27-1976   DATE OF ADMISSION:  03/25/2008  DATE OF DISCHARGE:  03/27/2008                               DISCHARGE SUMMARY   DISCHARGE DIAGNOSES:  1. Preterm pregnancy at 31-32 weeks' gestation.  2. Preterm labor with cervical change to 3 cm.  3. Status post magnesium tocolysis.   DISCHARGE MEDICATIONS:  Terbutaline 1-2 tablets p.o. every 4 hours  p.r.n. 2.5 mg.   HOSPITAL COURSE:  The patient is a 35 year old G1, P0 who was admitted  with contractions and cervical change at 31+ weeks' gestation.  Her  prenatal care at this point had been significant for some mild cervical  change to 1 cm with no significant contractions.  She did at that point  began Procardia and was changed to terbutaline when she felt that she  was not improving.  On admission, the patient was noted to have  contractions and cervix was felt by the nurses to be 3 cm and 80%  effaced.  She had a positive fetal fibronectin but there was some blood  present.  Given her cervical change, she was admitted and placed on  magnesium and IV penicillin for tocolysis.  A group B strep culture was  done.  She continued on the magnesium, although it did take a while for  her contractions to slow down.  Her cervix remained stable at 3 and 70  and -2 station.  We gave her 2 shots of betamethasone for fetal lung  maturity and continued on her penicillin until her group B strep culture  came back negative.  After the betamethasone was on board, the patient  was weaned off her magnesium and did well with p.o. terbutaline p.r.n.  and was felt stable for discharge home on that regimen and bedrest.  She  was discharged home with followup in the office planned in 1 week.      Huel Cote, M.D.  Electronically Signed     KR/MEDQ  D:  05/20/2008  T:  05/20/2008  Job:  [id number]

## 2011-04-13 NOTE — Discharge Summary (Signed)
NAMEKEONTA, Destiny Mitchell NO.:  [id number]   MEDICAL RECORD NO.:  [id number]          PATIENT TYPE:  INP   LOCATION:  1405                         FACILITY:  [facility]   PHYSICIAN:  Heloise Purpura, MD      DATE OF BIRTH:  Apr 22, 1976   DATE OF ADMISSION:  06/24/2008  DATE OF DISCHARGE:  06/26/2008                               DISCHARGE SUMMARY   ADMISSION DIAGNOSIS:  Left ureteropelvic junction obstruction.   DISCHARGE DIAGNOSIS:  Left ureteropelvic junction obstruction.   HISTORY AND PHYSICAL:  For full details, please see admission history  and physical.  Briefly, Ms. Destiny Mitchell is a 35 year old female who was  incidentally found to have severe left-sided hydronephrosis on an  imaging study performed for further reasons.  She was asymptomatic at  that time.  She underwent further evaluation including retrograde  pyelography and nuclear medicine renography which demonstrated decreased  relative renal function and findings consistent anatomically with a  ureteropelvic junction obstruction.  She was counseled regarding  management options for treatment and based on the fact that she did  appear to have enough relative renal function to warrant renal salvage,  she did wish to proceed with repair.  After discussing options for  repair, she elected to proceed with a robotic-assisted laparoscopic  dismembered left pyeloplasty.   HOSPITAL COURSE:  On June 24, 2008, she was taken to the operating room  and underwent cystoscopy, retrograde pyelography, and left ureteral  stent placement.  She was repositioned and underwent a left robotic-  assisted laparoscopic dismembered pyeloplasty.  She tolerated this  procedure well and without complications.  Due to the massive size of  her renal pelvis which was related to her chronic hydronephrosis, she  did require extensive reduction pyeloplasty.  She also was found to have  an intraoperative crossing vessel which necessitated  transposition of  her ureteropelvic junction anterior to these vessels.  She tolerated the  procedure quite well, however.  Postoperatively, she was able to be  transferred to a regular hospital room following recovery from  anesthesia.  She did have some significant postoperative nausea which  was controlled with antiemetics.  On postoperative day 1, she remained  hemodynamically stable.  Her serum creatinine was 36.8; however, she was  noted to be somewhat hypertensive and tachycardiac.  It was felt that  this was related to pain as well as anxiety which she admitted to and  has been treated for before in the past.  She maintained excellent urine  output with minimal output from her pelvic drain.  Her Foley catheter  was therefore removed and she was monitored throughout the day on  postoperative day 1.  She was able to begin ambulating and her diet was  able to be gradually advanced and until she was taking a regular diet  later that day.  Her anxiety improved as did her blood pressure and  tachycardia.  By the morning of postoperative day 2, she remained stable  and was tolerating her diet.  Her pain was well-controlled on oral pain  medication and her drain creatinine was found to  be consistent with  serum.  Therefore her perinephric drain was removed and she was able to  be discharged home in excellent condition.   DISPOSITION:  Home.   DISCHARGE MEDICATIONS:  She was instructed to take Vicodin as needed for  pain and to use Colace as a stool softener.  She was otherwise  instructed that she may resume any regular home medications excepting  any aspirin, nonsteroidal anti-inflammatory drugs, or herbal  supplements.   DISCHARGE INSTRUCTIONS:  She was instructed to be ambulatory but  specifically told to refrain from any heavy lifting, strenuous activity,  or driving.  She was instructed to continue to gradually advance her  diet as tolerated.   FOLLOWUP:  She will followup in  approximately 10 days for further  postoperative evaluation.      Heloise Purpura, MD  Electronically Signed     LB/MEDQ  D:  06/28/2008  T:  06/28/2008  Job:  [id number]

## 2011-05-23 ENCOUNTER — Other Ambulatory Visit (HOSPITAL_COMMUNITY): Payer: Self-pay | Admitting: Urology

## 2011-05-23 DIAGNOSIS — N135 Crossing vessel and stricture of ureter without hydronephrosis: Secondary | ICD-10-CM

## 2011-07-02 ENCOUNTER — Encounter (HOSPITAL_COMMUNITY)
Admission: RE | Admit: 2011-07-02 | Discharge: 2011-07-02 | Disposition: A | Discharge status: Home / Self Care | Payer: 59 | Source: Ambulatory Visit | Attending: Urology | Admitting: Urology

## 2011-07-02 ENCOUNTER — Other Ambulatory Visit (HOSPITAL_COMMUNITY): Payer: Self-pay

## 2011-07-02 DIAGNOSIS — N135 Crossing vessel and stricture of ureter without hydronephrosis: Secondary | ICD-10-CM | POA: Insufficient documentation

## 2011-07-02 MED ORDER — FUROSEMIDE 10 MG/ML IJ SOLN: 31.0000 mg | Freq: Once | INTRAMUSCULAR | Status: DC

## 2011-07-02 MED ORDER — TECHNETIUM TC 99M MERTIATIDE
15.0000 | Freq: Once | INTRAVENOUS | Status: AC | PRN
  Administered 2011-07-02: 15 via INTRAVENOUS

## 2011-08-20 LAB — CBC
HCT: 36.9
Hemoglobin: 12.9
Platelets: 225
RDW: 14.2
WBC: 10.5

## 2011-08-20 LAB — COMPREHENSIVE METABOLIC PANEL
ALT: 20
Albumin: 2.7 — ABNORMAL LOW
Alkaline Phosphatase: 85
BUN: 7
CO2: 22
Chloride: 108
Creatinine, Ser: 0.63
GFR calc non Af Amer: >60
Sodium: 137
Total Protein: 6.4

## 2011-08-20 LAB — LACTATE DEHYDROGENASE: LDH: 139

## 2011-08-21 LAB — CBC
Hemoglobin: 12.8
MCHC: 34.8
RBC: 4.23
RDW: 13.6
WBC: 11.7 — ABNORMAL HIGH

## 2011-08-21 LAB — COMPREHENSIVE METABOLIC PANEL
AST: 25
Albumin: 2.6 — ABNORMAL LOW
Alkaline Phosphatase: 94
BUN: 7
Chloride: 104
GFR calc Af Amer: >60
Potassium: 4.2
Sodium: 135
Total Bilirubin: 0.6
Total Protein: 6.4

## 2011-08-21 LAB — URINALYSIS, ROUTINE W REFLEX MICROSCOPIC
Bilirubin Urine: NEGATIVE
Glucose, UA: NEGATIVE
Ketones, ur: NEGATIVE
Nitrite: NEGATIVE
Specific Gravity, Urine: <1.005 — ABNORMAL LOW
pH: 5.5

## 2011-08-21 LAB — URIC ACID: Uric Acid, Serum: 4.3

## 2011-08-21 LAB — FETAL FIBRONECTIN: Fetal Fibronectin: POSITIVE

## 2011-08-22 LAB — COMPREHENSIVE METABOLIC PANEL
ALT: 19
ALT: 71 — ABNORMAL HIGH
AST: 20
AST: 24
Albumin: 2.5 — ABNORMAL LOW
Albumin: 2.6 — ABNORMAL LOW
Alkaline Phosphatase: 98
BUN: 12
BUN: 6
CO2: 24
CO2: 26
Calcium: 7.5 — ABNORMAL LOW
Calcium: 8.9
Calcium: 9
Chloride: 102
Creatinine, Ser: 0.88
Creatinine, Ser: 0.9
GFR calc Af Amer: >60
GFR calc Af Amer: >60
GFR calc non Af Amer: >60
GFR calc non Af Amer: >60
GFR calc non Af Amer: >60
Glucose, Bld: 109 — ABNORMAL HIGH
Glucose, Bld: 109 — ABNORMAL HIGH
Glucose, Bld: 81
Potassium: 3.6
Potassium: 3.7
Sodium: 133 — ABNORMAL LOW
Sodium: 136
Total Bilirubin: 0.5
Total Protein: 6.2
Total Protein: 6.2
Total Protein: 6.3
Total Protein: 6.7

## 2011-08-22 LAB — MAGNESIUM
Magnesium: 6.9
Magnesium: 7

## 2011-08-22 LAB — URINALYSIS, ROUTINE W REFLEX MICROSCOPIC
Bilirubin Urine: NEGATIVE
Ketones, ur: 15 — AB
Nitrite: NEGATIVE
Nitrite: NEGATIVE
Specific Gravity, Urine: <1.005 — ABNORMAL LOW
Urobilinogen, UA: 0.2
pH: 5
pH: 6.5

## 2011-08-22 LAB — URINE MICROSCOPIC-ADD ON

## 2011-08-22 LAB — CBC
HCT: 35.5 — ABNORMAL LOW
HCT: 36.1
HCT: 36.1
HCT: 38.2
Hemoglobin: 12.5
Hemoglobin: 12.6
Hemoglobin: 12.7
Hemoglobin: 13.2
MCHC: 34.4
MCHC: 34.5
MCHC: 34.5
MCHC: 34.8
MCHC: 35
MCV: 87
MCV: 87.1
MCV: 87.1
MCV: 87.2
Platelets: 197
Platelets: 224
Platelets: 300
Platelets: 315
RBC: 4.2
RBC: 4.35
RBC: 4.39
RDW: 13.1
RDW: 13.2
RDW: 13.4
WBC: 11.5 — ABNORMAL HIGH
WBC: 8.7

## 2011-08-22 LAB — LACTATE DEHYDROGENASE
LDH: 125
LDH: 173
LDH: 186
LDH: 189

## 2011-08-22 LAB — URIC ACID: Uric Acid, Serum: 5.7

## 2011-08-23 LAB — POCT HEMOGLOBIN-HEMACUE
Hemoglobin: 13.6
Operator id: 268271

## 2011-08-24 LAB — HEMOGLOBIN AND HEMATOCRIT, BLOOD
HCT: 36.8
HCT: 39.1
Hemoglobin: 12.6
Hemoglobin: 13.1

## 2011-08-24 LAB — CBC
MCHC: 33.7
MCV: 85.8
Platelets: 238

## 2011-08-24 LAB — BASIC METABOLIC PANEL
BUN: 12
CO2: 26
CO2: 29
Chloride: 104
Chloride: 106
Chloride: 106
Creatinine, Ser: 1.01
GFR calc Af Amer: >60
GFR calc non Af Amer: >60
Potassium: 3.6
Potassium: 4.9
Sodium: 140
Sodium: 141

## 2011-08-24 LAB — CREATININE, FLUID (PLEURAL, PERITONEAL, JP DRAINAGE)

## 2011-08-24 LAB — TYPE AND SCREEN: ABO/RH(D): A POS

## 2011-08-24 LAB — PREGNANCY, URINE: Preg Test, Ur: NEGATIVE

## 2011-09-06 ENCOUNTER — Encounter: Payer: Self-pay | Admitting: Nurse Practitioner

## 2011-09-10 ENCOUNTER — Encounter: Payer: Self-pay | Admitting: Nurse Practitioner

## 2011-09-10 ENCOUNTER — Ambulatory Visit (INDEPENDENT_AMBULATORY_CARE_PROVIDER_SITE_OTHER): Payer: 59 | Admitting: Nurse Practitioner

## 2011-09-10 VITALS — BP 112/78 | HR 88 | Ht 66.5 in | Wt 143.0 lb

## 2011-09-10 DIAGNOSIS — R002 Palpitations: Secondary | ICD-10-CM

## 2011-09-10 NOTE — Progress Notes (Signed)
    [name] Date of Birth: 06/24/1976   History of Present Illness: Destiny Mitchell is seen today for a work in visit. She is seen for Dr. Swazil[place]. She has had a month's history of recurrent palpitations. She has had about 4 to 5 spells total. First one started 4 weeks ago while in the car. She felt her heart racing and got very clammy. She was not able to drive home. Her husband drove her home. When she got there, she checked her blood pressure and it was elevated. Heart rate was in the 90's. She felt very drained and washed out afterwards. No syncope. Has had a recurrent spell each week since. Each lasts a few minutes. She is not able to note a pattern. No aggravating or relieving factors. She does not use caffeine or use alcohol.   No current outpatient prescriptions on file prior to visit.    No Known Allergies  Past Medical History  Diagnosis Date  . Palpitations   . Anxiety attack   . Sinus tachycardia   . Back pain   . Syncope and collapse   . Pre-eclampsia, postpartum   . Ureter obstruction     s/p surgery in 2009    Past Surgical History  Procedure Date  . Wisdom tooth extraction   . US echocardiography 01/01/2005    EF 55-60%. NORMAL  . 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 Never Smoker   Smokeless tobacco  . Not on file    History  Alcohol Use No    Family History  Problem Relation Age of Onset  . Mitral valve prolapse Mother   . Heart attack Father   . Arrhythmia Sister     Review of Systems: The review of systems is positive for recurrent palpitations.  All other systems were reviewed and are negative.  Physical Exam: BP 112/78  Pulse 88  Ht 5' 6.5" (1.689 m)  Wt 143 lb (64.864 kg)  BMI 22.73 kg/m2 Patient is very pleasant and in no acute distress. Skin is warm and dry. Color is normal.  HEENT is unremarkable. Normocephalic/atraumatic. PERRL. Sclera are nonicteric. Neck is supple. No masses. No  JVD. Lungs are clear. Cardiac exam shows a regular rate and rhythm. Abdomen is soft. Extremities are without edema. Gait and ROM are intact. No gross neurologic deficits noted.   LABORATORY DATA: EKG shows sinus rhythm.    Assessment / Plan:

## 2011-09-10 NOTE — Patient Instructions (Signed)
We are going to put a monitor on to look at your heart rhythm for the next month.   I will see you back in a month.  Call for any problems.

## 2011-09-10 NOTE — Assessment & Plan Note (Signed)
Sounds like she is having SVT. Will place an event monitor for the next month. We will see her back in a month. Her last echo was in 2006. I have not repeated. She has had recent labs with Dr. Felipa Eth. Patient is agreeable to this plan and will call if any problems develop in the interim.

## 2011-10-08 ENCOUNTER — Ambulatory Visit (INDEPENDENT_AMBULATORY_CARE_PROVIDER_SITE_OTHER): Payer: 59 | Admitting: Nurse Practitioner

## 2011-10-08 ENCOUNTER — Encounter: Payer: Self-pay | Admitting: Nurse Practitioner

## 2011-10-08 VITALS — BP 111/76 | HR 79 | Ht 66.0 in | Wt 142.0 lb

## 2011-10-08 DIAGNOSIS — R002 Palpitations: Secondary | ICD-10-CM

## 2011-10-08 MED ORDER — PROPRANOLOL HCL 10 MG PO TABS: 10.0000 mg | ORAL_TABLET | Freq: Three times a day (TID) | ORAL | Status: DC | PRN

## 2011-10-08 NOTE — Patient Instructions (Signed)
You may use Inderal 10 mg as needed for any palpitations.  We will see you back as needed.   Call the [facility] office at [phone] if you have any questions, problems or concerns.

## 2011-10-08 NOTE — Assessment & Plan Note (Signed)
She is doing ok. I have given her some Inderal 10 mg to use just as needed. She will let us know if she has any other problems. Patient is agreeable to this plan and will call if any problems develop in the interim.

## 2011-10-08 NOTE — Progress Notes (Signed)
    Destiny Mitchell Date of Birth: [birth year] Medical Record #[id number]  History of Present Illness: Destiny Mitchell is seen back today for a one month check. She is seen for Dr. Swaziland. She is doing well. Having very rare palpitations. Her event monitor was unremarkable. She had one short run of some tachycardia at a rate of 115. No SVT. No arrhythmia noted. She does not use alcohol or caffeine. No other issues.   Current Outpatient Prescriptions on File Prior to Visit  Medication Sig Dispense Refill  . desogestrel-ethinyl estradiol (KARIVA,AZURETTE,MIRCETTE) 0.15-0.02/0.01 MG (21/5) tablet Take 1 tablet by mouth daily.          No Known Allergies  Past Medical History  Diagnosis Date  . Palpitations   . Anxiety attack   . Sinus tachycardia   . Back pain   . Syncope and collapse   . Pre-eclampsia, postpartum   . Ureter obstruction     s/p surgery in 2009    Past Surgical History  Procedure Date  . Wisdom tooth extraction   . US echocardiography 01/01/2005    EF 55-60%. NORMAL  . 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 Never Smoker   Smokeless tobacco  . Not on file    History  Alcohol Use No    Family History  Problem Relation Age of Onset  . Mitral valve prolapse Mother   . Heart attack Father   . Arrhythmia Sister     Review of Systems: The review of systems is positive for very rare "flutters".  All other systems were reviewed and are negative.  Physical Exam: BP 111/76  Pulse 79  Ht 5\' 6"  (1.676 m)  Wt 142 lb (64.411 kg)  BMI 22.92 kg/m2 Patient is very pleasant and in no acute distress. Skin is warm and dry. Color is normal.  HEENT is unremarkable. Normocephalic/atraumatic. PERRL. Sclera are nonicteric. Neck is supple. No masses. No JVD. Lungs are clear. Cardiac exam shows a regular rate and rhythm. Abdomen is soft. Extremities are without edema. Gait and ROM are intact. No gross neurologic deficits  noted.  LABORATORY DATA:    Assessment / Plan:

## 2011-11-22 ENCOUNTER — Other Ambulatory Visit: Payer: Self-pay | Admitting: Dermatology

## 2011-11-30 ENCOUNTER — Telehealth: Payer: Self-pay

## 2011-11-30 MED ORDER — NEBIVOLOL HCL 5 MG PO TABS: 5.0000 mg | ORAL_TABLET | Freq: Every day | ORAL | Status: DC

## 2011-11-30 NOTE — Telephone Encounter (Signed)
Patient called,states blood pressure has been elevated. States noticed this past Monday 11/26/11,face flushed,dull headache,138/92,141/90,136/98.Spoke with Norma Fredrickson NP,advised to stop inderal,start bystolic 5 mg daily.Record readings and let us know.

## 2011-12-05 ENCOUNTER — Encounter: Payer: Self-pay | Admitting: Cardiology

## 2011-12-20 ENCOUNTER — Other Ambulatory Visit: Payer: Self-pay | Admitting: Dermatology

## 2012-04-03 ENCOUNTER — Other Ambulatory Visit: Payer: Self-pay | Admitting: Dermatology

## 2012-06-12 DIAGNOSIS — F419 Anxiety disorder, unspecified: Secondary | ICD-10-CM | POA: Insufficient documentation

## 2012-08-25 ENCOUNTER — Telehealth: Payer: Self-pay | Admitting: *Deleted

## 2012-08-25 NOTE — Telephone Encounter (Signed)
Samples at front desk 

## 2012-10-06 ENCOUNTER — Other Ambulatory Visit: Payer: Self-pay | Admitting: Dermatology

## 2012-11-21 ENCOUNTER — Other Ambulatory Visit: Payer: Self-pay | Admitting: Obstetrics and Gynecology

## 2012-11-21 DIAGNOSIS — N6019 Diffuse cystic mastopathy of unspecified breast: Secondary | ICD-10-CM

## 2012-11-25 ENCOUNTER — Ambulatory Visit
Admission: RE | Admit: 2012-11-25 | Discharge: 2012-11-25 | Disposition: A | Discharge status: Home / Self Care | Payer: 59 | Source: Ambulatory Visit | Attending: Obstetrics and Gynecology | Admitting: Obstetrics and Gynecology

## 2012-11-25 DIAGNOSIS — N6019 Diffuse cystic mastopathy of unspecified breast: Secondary | ICD-10-CM

## 2012-11-27 ENCOUNTER — Other Ambulatory Visit: Payer: 59

## 2013-01-02 ENCOUNTER — Telehealth: Payer: Self-pay | Admitting: Nurse Practitioner

## 2013-01-02 NOTE — Telephone Encounter (Signed)
Discussion with Dr. Swaziland regarding Bystolic and pregnancy.   It is pregnancy category C- some possible effects on fetus but could be used if benefits outweighed risk. The safest thing to do would be to come off Bystolic and monitor during pregnancy. Some data would not treat BP unless over 150/100. May have to put up with palpitations in the meantime. Theron Arista ----- Message ----- From: Rosalio Macadamia, NP Sent: 12/31/2012 4:27 PM To: Peter M Swaziland, MD Theron Arista, I think we talked about this a couple of weeks ago. Addis is going to be trying to get pregnant. She is on Bystolic for HTN and palpitations with great results. What are your recommendations for her? Thanks Katleen Carraway    Will forward this information onto [place] for her review.

## 2013-01-23 ENCOUNTER — Other Ambulatory Visit: Payer: Self-pay | Admitting: Nurse Practitioner

## 2013-01-23 ENCOUNTER — Other Ambulatory Visit: Payer: Self-pay

## 2013-01-23 MED ORDER — NEBIVOLOL HCL 5 MG PO TABS: 5.0000 mg | ORAL_TABLET | Freq: Every day | ORAL | Status: DC

## 2013-02-20 ENCOUNTER — Telehealth: Payer: Self-pay | Admitting: *Deleted

## 2013-02-20 NOTE — Telephone Encounter (Signed)
Samples of Bystolic 5 mg lot #[id number] exp 4/16 put at front for patient pick up

## 2013-03-18 ENCOUNTER — Other Ambulatory Visit: Payer: Self-pay | Admitting: Obstetrics and Gynecology

## 2013-03-18 DIAGNOSIS — N6001 Solitary cyst of right breast: Secondary | ICD-10-CM

## 2013-04-10 ENCOUNTER — Other Ambulatory Visit: Payer: Self-pay | Admitting: Dermatology

## 2013-05-18 ENCOUNTER — Ambulatory Visit
Admission: RE | Admit: 2013-05-18 | Discharge: 2013-05-18 | Disposition: A | Discharge status: Home / Self Care | Payer: 59 | Source: Ambulatory Visit | Attending: Obstetrics and Gynecology | Admitting: Obstetrics and Gynecology

## 2013-05-18 DIAGNOSIS — N6001 Solitary cyst of right breast: Secondary | ICD-10-CM

## 2013-06-04 ENCOUNTER — Other Ambulatory Visit (HOSPITAL_COMMUNITY): Payer: Self-pay | Admitting: Urology

## 2013-06-04 DIAGNOSIS — N135 Crossing vessel and stricture of ureter without hydronephrosis: Secondary | ICD-10-CM

## 2013-07-15 ENCOUNTER — Other Ambulatory Visit: Payer: Self-pay | Admitting: Nurse Practitioner

## 2013-07-15 MED ORDER — NEBIVOLOL HCL 5 MG PO TABS: 5.0000 mg | ORAL_TABLET | Freq: Every day | ORAL | Status: DC

## 2013-07-22 ENCOUNTER — Encounter (HOSPITAL_COMMUNITY): Payer: 59

## 2013-07-28 ENCOUNTER — Telehealth: Payer: Self-pay | Admitting: Nurse Practitioner

## 2013-07-28 NOTE — Telephone Encounter (Signed)
Destiny Mitchell called me this morning. Reports that she had stopped checking her BP for the most part - had still been taking her Bystolic.  This past weekend - BP was down in the 80 to 90's systolic. She held one dose. May have gotten overheated/dehydrated. Menses very heavy last week. Not pregnant. Will monitor her BP and tentatively cut back to 2.5 mg or may stop altogether if BP is less than 120. She is otherwise doing well.

## 2013-09-23 ENCOUNTER — Encounter (HOSPITAL_COMMUNITY)
Admission: RE | Admit: 2013-09-23 | Discharge: 2013-09-23 | Disposition: A | Discharge status: Home / Self Care | Payer: 59 | Source: Ambulatory Visit | Attending: Urology | Admitting: Urology

## 2013-09-23 DIAGNOSIS — N135 Crossing vessel and stricture of ureter without hydronephrosis: Secondary | ICD-10-CM | POA: Insufficient documentation

## 2013-09-23 LAB — PREGNANCY, URINE: Preg Test, Ur: NEGATIVE

## 2013-09-23 MED ORDER — TECHNETIUM TC 99M MERTIATIDE
14.0000 | Freq: Once | INTRAVENOUS | Status: AC | PRN
  Administered 2013-09-23: 14 via INTRAVENOUS

## 2013-09-23 MED ORDER — FUROSEMIDE 10 MG/ML IJ SOLN
31.0000 mg | Freq: Once | INTRAMUSCULAR | Status: AC
  Administered 2013-09-23: 31 mg via INTRAVENOUS
  Filled 2013-09-23: qty 4

## 2013-10-12 ENCOUNTER — Other Ambulatory Visit: Payer: Self-pay | Admitting: Dermatology

## 2013-10-26 ENCOUNTER — Other Ambulatory Visit: Payer: Self-pay | Admitting: Obstetrics and Gynecology

## 2013-10-26 DIAGNOSIS — N6310 Unspecified lump in the right breast, unspecified quadrant: Secondary | ICD-10-CM

## 2013-11-26 NOTE — L&D Delivery Note (Signed)
Delivery Note At 1:21 PM a viable and healthy female was delivered via Vaginal, Spontaneous Delivery (Presentation: Left Occiput Anterior).  APGAR: 9, 9; weight P .   Placenta status: Intact, Spontaneous.  Cord: 3 vessels with the following complications: None.   Anesthesia: Epidural  Episiotomy: None Lacerations: 2nd degree Suture Repair: 3.0 vicryl rapide Est. Blood Loss (mL): 400cc  Mom to postpartum.  Baby to Couplet care / Skin to Skin.  Bovard-Stuckert, Aldan Camey 06/22/2014, 1:47 PM  Br/A+/contra - vasec/RI/got Tdap

## 2013-11-30 ENCOUNTER — Ambulatory Visit
Admission: RE | Admit: 2013-11-30 | Discharge: 2013-11-30 | Disposition: A | Discharge status: Home / Self Care | Payer: 59 | Source: Ambulatory Visit | Attending: Obstetrics and Gynecology | Admitting: Obstetrics and Gynecology

## 2013-11-30 DIAGNOSIS — N6310 Unspecified lump in the right breast, unspecified quadrant: Secondary | ICD-10-CM

## 2014-01-12 ENCOUNTER — Ambulatory Visit: Payer: Self-pay | Admitting: Podiatry

## 2014-01-26 ENCOUNTER — Telehealth: Payer: Self-pay | Admitting: *Deleted

## 2014-01-26 ENCOUNTER — Ambulatory Visit (INDEPENDENT_AMBULATORY_CARE_PROVIDER_SITE_OTHER): Payer: 59 | Admitting: Podiatry

## 2014-01-26 ENCOUNTER — Encounter: Payer: Self-pay | Admitting: Podiatry

## 2014-01-26 VITALS — BP 103/61 | HR 72 | Resp 16 | Ht 66.5 in | Wt 142.0 lb

## 2014-01-26 DIAGNOSIS — B353 Tinea pedis: Secondary | ICD-10-CM

## 2014-01-26 DIAGNOSIS — L608 Other nail disorders: Secondary | ICD-10-CM

## 2014-01-26 NOTE — Telephone Encounter (Signed)
Left 5th toenail fragment sent to [facility] for definitive diagnosis of fungal element. 

## 2014-01-26 NOTE — Progress Notes (Signed)
   Subjective:    Patient ID: Destiny Mitchell, female    DOB: Sep 20, 1976, 38 y.o.   MRN: [id number]  HPI Comments: i think i have infection in my toenails , mainly the little toe , left 5th toenail discoloration  i am [redacted] weeks pregnant.      Review of Systems  Genitourinary:       Pregnant   All other systems reviewed and are negative.       Objective:   Physical Exam I have reviewed her past medical history medications allergies surgeries and social history. Pulses are palpable bilateral. Neurologic sensorium is intact. Muscle strength is normal bilateral. Orthopedic evaluation does demonstrate some congenital hammertoe deformities bilateral. Cutaneous evaluation demonstrates supple well hydrated cutis she does have interdigital tinea pedis she also has what appears to be dystrophic for fungal toenails. Particularly the fifth digit of the left foot        Assessment & Plan:  Assessment dystrophic nail pattern possible onychomycosis. Tinea pedis and interdigital tinea pedis bilateral.  Plan: Discussed etiology pathology conservative versus surgical therapies. We took samples of the nails today particularly the fifth digit of the left foot. I also provided her with samples of LUZU to applied twice daily to the interdigital space. I will followup with her once our pathology comes back she will not be able to be treated until she has delivered her child and is no longer breast-feeding.

## 2014-02-16 ENCOUNTER — Encounter: Payer: Self-pay | Admitting: Podiatry

## 2014-02-24 ENCOUNTER — Telehealth: Payer: Self-pay | Admitting: *Deleted

## 2014-02-24 NOTE — Telephone Encounter (Signed)
Per Dr. Milinda Pointer, I left the patient a message to call and schedule an appointment for her fungal culture results.

## 2014-02-24 NOTE — Telephone Encounter (Signed)
I had a message to call Destiny Mitchell back.  I want to clarify what he said at my appointment.  I returned her call.  She stated she's pregnant, baby is due in August.  He said he wouldn't be a to prescribe me anything until then.  So I'm wondering why he wants to see me.  I informed her it may have been an oversight on his part.  We'll see her in August.  I'll let him know.  I told her to continue using the topical that he gave her.

## 2014-03-10 ENCOUNTER — Encounter: Payer: Self-pay | Admitting: Podiatry

## 2014-06-22 ENCOUNTER — Encounter (HOSPITAL_COMMUNITY): Payer: Self-pay

## 2014-06-22 ENCOUNTER — Inpatient Hospital Stay (HOSPITAL_COMMUNITY): Payer: 59 | Admitting: Anesthesiology

## 2014-06-22 ENCOUNTER — Inpatient Hospital Stay (HOSPITAL_COMMUNITY)
Admission: AD | Admit: 2014-06-22 | Discharge: 2014-06-24 | DRG: 775 | Disposition: A | Discharge status: Home / Self Care | Payer: 59 | Source: Ambulatory Visit | Attending: Obstetrics and Gynecology | Admitting: Obstetrics and Gynecology

## 2014-06-22 ENCOUNTER — Encounter (HOSPITAL_COMMUNITY): Payer: 59 | Admitting: Anesthesiology

## 2014-06-22 DIAGNOSIS — O26839 Pregnancy related renal disease, unspecified trimester: Secondary | ICD-10-CM | POA: Diagnosis present

## 2014-06-22 DIAGNOSIS — Z801 Family history of malignant neoplasm of trachea, bronchus and lung: Secondary | ICD-10-CM | POA: Diagnosis not present

## 2014-06-22 DIAGNOSIS — N189 Chronic kidney disease, unspecified: Secondary | ICD-10-CM | POA: Diagnosis present

## 2014-06-22 DIAGNOSIS — Z8249 Family history of ischemic heart disease and other diseases of the circulatory system: Secondary | ICD-10-CM | POA: Diagnosis not present

## 2014-06-22 DIAGNOSIS — Z833 Family history of diabetes mellitus: Secondary | ICD-10-CM

## 2014-06-22 DIAGNOSIS — Z8051 Family history of malignant neoplasm of kidney: Secondary | ICD-10-CM | POA: Diagnosis not present

## 2014-06-22 DIAGNOSIS — O99344 Other mental disorders complicating childbirth: Secondary | ICD-10-CM | POA: Diagnosis present

## 2014-06-22 DIAGNOSIS — F410 Panic disorder [episodic paroxysmal anxiety] without agoraphobia: Secondary | ICD-10-CM | POA: Diagnosis present

## 2014-06-22 DIAGNOSIS — O4700 False labor before 37 completed weeks of gestation, unspecified trimester: Secondary | ICD-10-CM | POA: Diagnosis present

## 2014-06-22 DIAGNOSIS — O09529 Supervision of elderly multigravida, unspecified trimester: Secondary | ICD-10-CM | POA: Diagnosis present

## 2014-06-22 HISTORY — DX: Chronic kidney disease, unspecified: N18.9

## 2014-06-22 LAB — URINALYSIS, ROUTINE W REFLEX MICROSCOPIC
Bilirubin Urine: NEGATIVE
GLUCOSE, UA: NEGATIVE mg/dL
HGB URINE DIPSTICK: NEGATIVE
Ketones, ur: NEGATIVE mg/dL
Nitrite: NEGATIVE
PROTEIN: NEGATIVE mg/dL
Urobilinogen, UA: 0.2 mg/dL (ref 0.0–1.0)
pH: 7 (ref 5.0–8.0)

## 2014-06-22 LAB — CBC
HCT: 37.2 % (ref 36.0–46.0)
HEMOGLOBIN: 12.9 g/dL (ref 12.0–15.0)
MCH: 30.4 pg (ref 26.0–34.0)
MCHC: 34.7 g/dL (ref 30.0–36.0)
MCV: 87.5 fL (ref 78.0–100.0)
Platelets: 180 10*3/uL (ref 150–400)
RBC: 4.25 MIL/uL (ref 3.87–5.11)
RDW: 13.1 % (ref 11.5–15.5)
WBC: 11.7 10*3/uL — ABNORMAL HIGH (ref 4.0–10.5)

## 2014-06-22 LAB — URINE MICROSCOPIC-ADD ON

## 2014-06-22 LAB — OB RESULTS CONSOLE HEPATITIS B SURFACE ANTIGEN: Hepatitis B Surface Ag: NEGATIVE

## 2014-06-22 LAB — OB RESULTS CONSOLE RUBELLA ANTIBODY, IGM: Rubella: IMMUNE

## 2014-06-22 LAB — OB RESULTS CONSOLE HIV ANTIBODY (ROUTINE TESTING): HIV: NONREACTIVE

## 2014-06-22 LAB — AMNISURE RUPTURE OF MEMBRANE (ROM) NOT AT ARMC: AMNISURE: NEGATIVE

## 2014-06-22 LAB — GROUP B STREP BY PCR: GROUP B STREP BY PCR: NEGATIVE

## 2014-06-22 LAB — OB RESULTS CONSOLE RPR: RPR: NONREACTIVE

## 2014-06-22 LAB — RPR

## 2014-06-22 LAB — OB RESULTS CONSOLE GC/CHLAMYDIA
CHLAMYDIA, DNA PROBE: NEGATIVE
Gonorrhea: NEGATIVE

## 2014-06-22 LAB — OB RESULTS CONSOLE GBS: STREP GROUP B AG: NEGATIVE

## 2014-06-22 MED ORDER — DIBUCAINE 1 % RE OINT
1.0000 | TOPICAL_OINTMENT | RECTAL | Status: DC | PRN
Start: 2014-06-22 — End: 2014-06-24

## 2014-06-22 MED ORDER — ZOLPIDEM TARTRATE 5 MG PO TABS: 5.0000 mg | ORAL_TABLET | Freq: Every evening | ORAL | Status: DC | PRN

## 2014-06-22 MED ORDER — LANOLIN HYDROUS EX OINT: TOPICAL_OINTMENT | CUTANEOUS | Status: DC | PRN

## 2014-06-22 MED ORDER — PHENYLEPHRINE 40 MCG/ML (10ML) SYRINGE FOR IV PUSH (FOR BLOOD PRESSURE SUPPORT)
80.0000 ug | PREFILLED_SYRINGE | INTRAVENOUS | Status: DC | PRN
  Filled 2014-06-22: qty 10
  Filled 2014-06-22: qty 2

## 2014-06-22 MED ORDER — LACTATED RINGERS IV SOLN: 500.0000 mL | INTRAVENOUS | Status: DC | PRN

## 2014-06-22 MED ORDER — ONDANSETRON HCL 4 MG PO TABS: 4.0000 mg | ORAL_TABLET | ORAL | Status: DC | PRN

## 2014-06-22 MED ORDER — BUTORPHANOL TARTRATE 1 MG/ML IJ SOLN: 1.0000 mg | INTRAMUSCULAR | Status: DC | PRN

## 2014-06-22 MED ORDER — OXYTOCIN 40 UNITS IN LACTATED RINGERS INFUSION - SIMPLE MED
62.5000 mL/h | INTRAVENOUS | Status: DC
Start: 2014-06-22 — End: 2014-06-22
  Administered 2014-06-22: 62.5 mL/h via INTRAVENOUS
  Filled 2014-06-22: qty 1000

## 2014-06-22 MED ORDER — LIDOCAINE HCL (PF) 1 % IJ SOLN
INTRAMUSCULAR | Status: DC | PRN
  Administered 2014-06-22 (×2): 5 mL

## 2014-06-22 MED ORDER — FLEET ENEMA 7-19 GM/118ML RE ENEM: 1.0000 | ENEMA | RECTAL | Status: DC | PRN

## 2014-06-22 MED ORDER — LACTATED RINGERS IV SOLN: INTRAVENOUS | Status: DC

## 2014-06-22 MED ORDER — LACTATED RINGERS IV SOLN
500.0000 mL | Freq: Once | INTRAVENOUS | Status: DC
Start: 2014-06-22 — End: 2014-06-24

## 2014-06-22 MED ORDER — WITCH HAZEL-GLYCERIN EX PADS: 1.0000 "application " | MEDICATED_PAD | CUTANEOUS | Status: DC | PRN

## 2014-06-22 MED ORDER — ONDANSETRON HCL 4 MG/2ML IJ SOLN: 4.0000 mg | Freq: Four times a day (QID) | INTRAMUSCULAR | Status: DC | PRN

## 2014-06-22 MED ORDER — ONDANSETRON HCL 4 MG/2ML IJ SOLN: 4.0000 mg | INTRAMUSCULAR | Status: DC | PRN

## 2014-06-22 MED ORDER — LIDOCAINE HCL (PF) 1 % IJ SOLN
30.0000 mL | INTRAMUSCULAR | Status: DC | PRN
  Filled 2014-06-22: qty 30

## 2014-06-22 MED ORDER — DIPHENHYDRAMINE HCL 50 MG/ML IJ SOLN: 12.5000 mg | INTRAMUSCULAR | Status: DC | PRN

## 2014-06-22 MED ORDER — IBUPROFEN 600 MG PO TABS
600.0000 mg | ORAL_TABLET | Freq: Four times a day (QID) | ORAL | Status: DC
  Administered 2014-06-22 – 2014-06-24 (×7): 600 mg via ORAL
  Filled 2014-06-22 (×7): qty 1

## 2014-06-22 MED ORDER — OXYTOCIN BOLUS FROM INFUSION: 500.0000 mL | INTRAVENOUS | Status: DC

## 2014-06-22 MED ORDER — OXYCODONE-ACETAMINOPHEN 5-325 MG PO TABS: 1.0000 | ORAL_TABLET | ORAL | Status: DC | PRN

## 2014-06-22 MED ORDER — PHENYLEPHRINE 40 MCG/ML (10ML) SYRINGE FOR IV PUSH (FOR BLOOD PRESSURE SUPPORT)
80.0000 ug | PREFILLED_SYRINGE | INTRAVENOUS | Status: DC | PRN
Start: 2014-06-22 — End: 2014-06-24
  Filled 2014-06-22: qty 2

## 2014-06-22 MED ORDER — ACETAMINOPHEN 325 MG PO TABS: 650.0000 mg | ORAL_TABLET | ORAL | Status: DC | PRN

## 2014-06-22 MED ORDER — CITRIC ACID-SODIUM CITRATE 334-500 MG/5ML PO SOLN: 30.0000 mL | ORAL | Status: DC | PRN

## 2014-06-22 MED ORDER — BENZOCAINE-MENTHOL 20-0.5 % EX AERO
1.0000 "application " | INHALATION_SPRAY | CUTANEOUS | Status: DC | PRN
  Administered 2014-06-22: 1 via TOPICAL
  Filled 2014-06-22: qty 56

## 2014-06-22 MED ORDER — EPHEDRINE 5 MG/ML INJ
10.0000 mg | INTRAVENOUS | Status: DC | PRN
  Filled 2014-06-22: qty 2

## 2014-06-22 MED ORDER — DIPHENHYDRAMINE HCL 25 MG PO CAPS: 25.0000 mg | ORAL_CAPSULE | Freq: Four times a day (QID) | ORAL | Status: DC | PRN

## 2014-06-22 MED ORDER — CALCIUM CARBONATE ANTACID 500 MG PO CHEW: 2.0000 | CHEWABLE_TABLET | Freq: Every day | ORAL | Status: DC | PRN

## 2014-06-22 MED ORDER — LACTATED RINGERS IV SOLN
INTRAVENOUS | Status: DC
  Administered 2014-06-22: 07:00:00 via INTRAVENOUS

## 2014-06-22 MED ORDER — FENTANYL 2.5 MCG/ML BUPIVACAINE 1/10 % EPIDURAL INFUSION (WH - ANES)
14.0000 mL/h | INTRAMUSCULAR | Status: DC | PRN
  Administered 2014-06-22: 14 mL/h via EPIDURAL
  Filled 2014-06-22: qty 125

## 2014-06-22 MED ORDER — PRENATAL MULTIVITAMIN CH
1.0000 | ORAL_TABLET | Freq: Every day | ORAL | Status: DC
  Administered 2014-06-23: 1 via ORAL
  Filled 2014-06-22: qty 1

## 2014-06-22 MED ORDER — SENNOSIDES-DOCUSATE SODIUM 8.6-50 MG PO TABS
2.0000 | ORAL_TABLET | ORAL | Status: DC
  Administered 2014-06-22: 2 via ORAL
  Filled 2014-06-22 (×2): qty 2

## 2014-06-22 MED ORDER — IBUPROFEN 600 MG PO TABS: 600.0000 mg | ORAL_TABLET | Freq: Four times a day (QID) | ORAL | Status: DC | PRN

## 2014-06-22 MED ORDER — SIMETHICONE 80 MG PO CHEW: 80.0000 mg | CHEWABLE_TABLET | ORAL | Status: DC | PRN

## 2014-06-22 NOTE — H&P (Signed)
Destiny Mitchell is a 38 y.o. female G2P0101 at 71+ presenting with ctx, cervical change in MAU.  Relatively uncomplicated prenatal care, h/o severe PreE.  Nl panorama, female infant.  [place] for S>D.  GBBS unknown, rapid in MAU negative.   D/w pt POC  Maternal Medical History:  Reason for admission: Contractions.   Contractions: Onset was 13-24 hours ago.   Frequency: regular.   Perceived severity is moderate.    Fetal activity: Perceived fetal activity is normal.    Prenatal Complications - Diabetes: none.    OB History   Grav Para Term Preterm Abortions TAB SAB Ect Mult Living   2 1  1      1     G1 34+ IOL severe PreE 5#9, female G2 present  No abn pap No STDs  Past Medical History  Diagnosis Date  . Palpitations   . Anxiety attack   . Sinus tachycardia   . Back pain   . Syncope and collapse   . Pre-eclampsia, postpartum   . Ureter obstruction     s/p surgery in 2009  . Chronic kidney disease    Past Surgical History  Procedure Laterality Date  . Wisdom tooth extraction    . US echocardiography  01/01/2005    EF 55-60%. NORMAL  . 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 depression, anxiety, HTN, kidney CA, Lung CA, DM, MI, CAD Social History:  reports that she has never smoked. She does not have any smokeless tobacco history on file. She reports that she does not drink alcohol or use illicit drugs.married, Glass blower/designer  Meds PNV All NKDA   Prenatal Transfer Tool  Maternal Diabetes: No Genetic Screening: Normal Maternal Ultrasounds/Referrals: Normal Fetal Ultrasounds or other Referrals:  None Maternal Substance Abuse:  No Significant Maternal Medications:  None Significant Maternal Lab Results:  Lab values include: Group B Strep negative Other Comments:  Panorama - Low Risk, glucola 103  Review of Systems   Constitutional: Negative.   HENT: Negative.   Eyes: Negative.   Respiratory: Negative.   Cardiovascular: Negative.   Gastrointestinal: Negative.   Genitourinary: Negative.   Musculoskeletal: Negative.   Skin: Negative.   Neurological: Negative.   Psychiatric/Behavioral: Negative.     Dilation: 5.5 Effacement (%): 80 Station: -1 Exam by:: Evonnie Dawes, rn Blood pressure 129/72, pulse 73, temperature 98.2 F (36.8 C), temperature source Oral, resp. rate 18, height 5\' 6"  (1.676 m), weight 82.725 kg (182 lb 6 oz), last menstrual period 08/31/2013. Maternal Exam:  Uterine Assessment: Contraction strength is moderate.  Contraction frequency is regular.   Abdomen: Fundal height is ppropriate for gestation .   Estimated fetal weight is 6-7#.   Fetal presentation: vertex  Introitus: Normal vulva. Normal vagina.  Pelvis: adequate for delivery.   Cervix: Cervix evaluated by digital exam.     Physical Exam  Constitutional: She is oriented to person, place, and time. She appears well-developed and well-nourished.  HENT:  Head: Normocephalic and atraumatic.  Cardiovascular: Normal rate and regular rhythm.   Respiratory: Effort normal and breath sounds normal. No respiratory distress. She has no wheezes.  GI: Soft. Bowel sounds are normal. She exhibits no distension. There is no tenderness.  Musculoskeletal: Normal range of motion.  Neurological: She is alert and oriented to person, place, and time.  Skin: Skin is warm and  dry.  Psychiatric: She has a normal mood and affect. Her behavior is normal.    Prenatal labs: ABO, Rh:  A+ Antibody:  neg Rubella: Immune (07/28 0000) RPR: Nonreactive (07/28 0000)  HBsAg: Negative (07/28 0000)  HIV: Non-reactive (07/28 0000)  GBS: Negative (07/28 0000)   Hgb 13.0/ Ur Cx neg/ Chl neg/ GC neg/Glucola 103/Plt 200K/ CF neg/ Panorama Low Risk, female/nl NT/ nl AFP//CF neg  [place] EDC cw LMP - EDC 07/22/24 Nl NT Nl anat, post, L fundal plac,  female  Tdap 05/10/14    Assessment/Plan: 38yo G2P0101 at 35+ in early labor If progresses will AROM Expect SVD Epidural prn   Bovard-Stuckert, Tinaya Ceballos 06/22/2014, 9:03 AM

## 2014-06-22 NOTE — Lactation Note (Signed)
This note was copied from the chart of Destiny Carmilla Granville. Lactation Consultation Note  Initial visit at 7 hours of age.  Baby is [redacted]w[redacted]d and 6#15 oz.  Dr. Burt Knack ordered 22 cal formula for a low blood sugar and mom will continue to use as a supplement when baby is not doing well at the breast.  Baby is STS on moms chest now asleep.  Baby was spitty and has not latched yet.  Mom has visitors in the room so breast assessment was not done at this time.  Mom reports she knows how to do hand expression.  Offered to assist with pumping, mom declines at this time.  DEBP supplies in room for when mom is ready.  [facility] LC resources given and discussed.  Encouraged to feed with early cues on demand and to wake baby for feedings every 2 1/2-3 hours if needed.  Early newborn and late preterm behavior discussed.  Mom to call for assist as needed. Report given to [facility] RN who plans to set up DEBP when mom is ready.     Patient Name: Destiny Mitchell [id number] Date: 06/22/2014     Maternal Data Has patient been taught Hand Expression?: Yes (per mom not assessed) Does the patient have breastfeeding experience prior to this delivery?: Yes  Feeding Feeding Type: Formula  LATCH Score/Interventions                      Lactation Tools Discussed/Used     Consult Status Consult Status: Follow-up Date: 06/23/14 Follow-up type: In-patient    Destiny Mitchell, Justine Null 06/22/2014, 9:19 PM

## 2014-06-22 NOTE — Anesthesia Preprocedure Evaluation (Addendum)
Anesthesia Evaluation  Patient identified by MRN, date of birth, ID band Patient awake    Reviewed: Allergy & Precautions, H&P , Patient's Chart, lab work & pertinent test results  Airway Mallampati: II TM Distance: >3 FB Neck ROM: full    Dental   Pulmonary  breath sounds clear to auscultation        Cardiovascular hypertension, Rhythm:regular Rate:Normal     Neuro/Psych    GI/Hepatic   Endo/Other    Renal/GU      Musculoskeletal   Abdominal   Peds  Hematology   Anesthesia Other Findings   Reproductive/Obstetrics (+) Pregnancy                          Anesthesia Physical Anesthesia Plan  ASA: II  Anesthesia Plan: Epidural   Post-op Pain Management:    Induction:   Airway Management Planned:   Additional Equipment:   Intra-op Plan:   Post-operative Plan:   Informed Consent: I have reviewed the patients History and Physical, chart, labs and discussed the procedure including the risks, benefits and alternatives for the proposed anesthesia with the patient or authorized representative who has indicated his/her understanding and acceptance.     Plan Discussed with:   Anesthesia Plan Comments:         Anesthesia Quick Evaluation

## 2014-06-22 NOTE — MAU Provider Note (Signed)
History     CSN: [id number]  Arrival date and time: 06/22/14 0348   First Provider Initiated Contact with Patient 06/22/14 [phone]      Chief Complaint  Patient presents with  . Contractions   HPI  Destiny Mitchell is a 38 y.o. G2P0101 at [redacted]w[redacted]d who presents today with UCs.She states that she woke up around 200 with contractions. She denies nay bleeding. She does report some leaking of fluid. She states that the baby is moving normally. She denies any complications with this pregnancy, and was induced at 34 weeks with her last pregnancy 2/2 hypertension.   Past Medical History  Diagnosis Date  . Palpitations   . Anxiety attack   . Sinus tachycardia   . Back pain   . Syncope and collapse   . Pre-eclampsia, postpartum   . Ureter obstruction     s/p surgery in 2009  . Chronic kidney disease     Past Surgical History  Procedure Laterality Date  . Wisdom tooth extraction    . US echocardiography  01/01/2005    EF 55-60%. NORMAL  . 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 Mitral valve prolapse Mother   . Heart attack Father   . Arrhythmia Sister     History  Substance Use Topics  . Smoking status: Never Smoker   . Smokeless tobacco: Not on file  . Alcohol Use: No    Allergies: No Known Allergies  Prescriptions prior to admission  Medication Sig Dispense Refill  . Prenatal Multivit-Min-Fe-FA (PRENATAL VITAMINS PO) Take by mouth.        ROS Physical Exam   Blood pressure 124/82, pulse 74, temperature 98.2 F (36.8 C), temperature source Oral, resp. rate 18, height 5\' 6"  (1.676 m), weight 82.725 kg (182 lb 6 oz), last menstrual period 08/31/2013.  Physical Exam  Nursing note and vitals reviewed. Constitutional: She is oriented to person, place, and time. She appears well-developed and well-nourished. No distress.  Cardiovascular: Normal rate.   Respiratory: Effort normal.  GI: Soft. There  is no tenderness.  Genitourinary:   Small amount of watery discharge on external genitalia and thighs, fern negative.  Cervix: 3-4/70/-1   Neurological: She is alert and oriented to person, place, and time.  Skin: Skin is warm and dry.  Psychiatric: She has a normal mood and affect.   FHT 145, moderate with accels, no decels Toco: ctx q 2-3 mins MAU Course  Procedures Results for orders placed during the hospital encounter of 06/22/14 (from the past 24 hour(s))  URINALYSIS, ROUTINE W REFLEX MICROSCOPIC     Status: Abnormal   Collection Time    06/22/14  3:52 AM      Result Value Ref Range   Color, Urine YELLOW  YELLOW   APPearance CLEAR  CLEAR   Specific Gravity, Urine <1.005 (*) 1.005 - 1.030   pH 7.0  5.0 - 8.0   Glucose, UA NEGATIVE  NEGATIVE mg/dL   Hgb urine dipstick NEGATIVE  NEGATIVE   Bilirubin Urine NEGATIVE  NEGATIVE   Ketones, ur NEGATIVE  NEGATIVE mg/dL   Protein, ur NEGATIVE  NEGATIVE mg/dL   Urobilinogen, UA 0.2  0.0 - 1.0 mg/dL   Nitrite NEGATIVE  NEGATIVE   Leukocytes, UA MODERATE (*) NEGATIVE  URINE MICROSCOPIC-ADD ON     Status: Abnormal   Collection Time    06/22/14  3:52 AM  Result Value Ref Range   Squamous Epithelial / LPF FEW (*) RARE   WBC, UA 3-6  <3 WBC/hpf   Bacteria, UA FEW (*) RARE  AMNISURE RUPTURE OF MEMBRANE (ROM)     Status: None   Collection Time    06/22/14  4:30 AM      Result Value Ref Range   Amnisure ROM NEGATIVE    GROUP B STREP BY PCR     Status: None   Collection Time    06/22/14  5:34 AM      Result Value Ref Range   Group B strep by PCR NEGATIVE  NEGATIVE   0530: D/W Dr. Marvel Plan will collect rapid GBS and recheck cervix around 0700.  0710: Patient is now 5cm, will admit   Assessment and Plan  Preterm labor Admit to labor and delivery Routine orders  Mathis Bud 06/22/2014, 7:14 AM

## 2014-06-22 NOTE — MAU Note (Signed)
Woke up at 2 am, cramping and low back pain. Didn't go away with rest and water.  Contractions 5 min apart. Denies bleeding.  Urinating more often and more than usual coming out for the last week.  Baby moving well.

## 2014-06-22 NOTE — Progress Notes (Addendum)
Patient ID: Destiny Mitchell, female   DOB: [birth year], 38 y.o.   MRN: [id number]  Comfortable with epidural  AFVSS gen NAD FHTs 130's, category 1 toco Q 74min  SVE 7.5/90/0-+1 AROM for clear fluid, w/o diff/comp  Expect SVD soon NICU aware

## 2014-06-22 NOTE — Anesthesia Procedure Notes (Signed)
Epidural Patient location during procedure: OB Start time: 06/22/2014 10:26 AM  Staffing Anesthesiologist: Rudean Curt Performed by: anesthesiologist   Preanesthetic Checklist Completed: patient identified, site marked, surgical consent, pre-op evaluation, timeout performed, IV checked, risks and benefits discussed and monitors and equipment checked  Epidural Patient position: sitting Prep: site prepped and draped and DuraPrep Patient monitoring: continuous pulse ox and blood pressure Approach: midline Location: L3-L4 Injection technique: LOR air  Needle:  Needle type: Tuohy  Needle gauge: 17 G Needle length: 9 cm and 9 Needle insertion depth: 5 cm cm Catheter type: closed end flexible Catheter size: 19 Gauge Catheter at skin depth: 10 cm Test dose: negative  Assessment Events: blood not aspirated, injection not painful, no injection resistance, negative IV test and no paresthesia  Additional Notes Patient identified.  Risk benefits discussed including failed block, incomplete pain control, headache, nerve damage, paralysis, blood pressure changes, nausea, vomiting, reactions to medication both toxic or allergic, and postpartum back pain.  Patient expressed understanding and wished to proceed.  All questions were answered.  Sterile technique used throughout procedure and epidural site dressed with sterile barrier dressing. No paresthesia or other complications noted.The patient did not experience any signs of intravascular injection such as tinnitus or metallic taste in mouth nor signs of intrathecal spread such as rapid motor block. Please see nursing notes for vital signs.

## 2014-06-23 LAB — CBC
HEMATOCRIT: 35.6 % — AB (ref 36.0–46.0)
Hemoglobin: 12.1 g/dL (ref 12.0–15.0)
MCH: 29.8 pg (ref 26.0–34.0)
MCHC: 34 g/dL (ref 30.0–36.0)
MCV: 87.7 fL (ref 78.0–100.0)
Platelets: 176 10*3/uL (ref 150–400)
RBC: 4.06 MIL/uL (ref 3.87–5.11)
RDW: 13.1 % (ref 11.5–15.5)
WBC: 12.2 10*3/uL — AB (ref 4.0–10.5)

## 2014-06-23 MED ORDER — ACETAMINOPHEN 325 MG PO TABS
650.0000 mg | ORAL_TABLET | Freq: Four times a day (QID) | ORAL | Status: DC | PRN
  Administered 2014-06-23: 650 mg via ORAL
  Filled 2014-06-23: qty 2

## 2014-06-23 NOTE — Progress Notes (Signed)
Post Partum Day 1 Subjective: no complaints, up ad lib, voiding, tolerating PO and nl lochia, pain controlled  Objective: Blood pressure 127/82, pulse 68, temperature 97.7 F (36.5 C), temperature source Oral, resp. rate 18, height 5\' 6"  (1.676 m), weight 82.725 kg (182 lb 6 oz), SpO2 98.00%, unknown if currently breastfeeding.  Physical Exam:  General: alert and no distress Lochia: appropriate Uterine Fundus: firm   Recent Labs  06/22/14 0750 06/23/14 0633  HGB 12.9 12.1  HCT 37.2 35.6*    Assessment/Plan: Plan for discharge tomorrow, Breastfeeding and Lactation consult.  Routine care.   LOS: 1 day   Bovard-Stuckert, Francie Keeling 06/23/2014, 8:19 AM

## 2014-06-23 NOTE — Progress Notes (Signed)
Clinical Social Work Department PSYCHOSOCIAL ASSESSMENT - MATERNAL/CHILD 06/23/2014  Patient:  Destiny Mitchell, Destiny Mitchell  Account Number:  [id number]  Admit Date:  06/22/2014  Clinical Social Worker:  Lucita Ferrara, CLINICAL SOCIAL WORKER   Date/Time:  06/23/2014 10:00 AM  Date Referred:  06/22/2014   Referral source  Central Nursery     Referred reason  Depression/Anxiety   Other referral source:    I:  FAMILY / [place] legal guardian:  PARENT  Guardian - Name Guardian - Age Guardian - Address  Sigourney Port[address] [place], [place] [address]  Dawna Part  Same as above   Other household support members/support persons Name Relationship DOB  Hunter SON 1 years old   Other support:   MOB and FOB confirmed supportive family and friends.    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  FOB works full-time as well between two different jobs.   Financial resources:  Multimedia programmer If [place]:    School / Grade:   Maternity Care Coordinator / Child Services Coordination / Early Interventions:  Cultural issues impacting care:   None reported    III  STRENGTHS Strengths  Adequate Resources  Compliance with medical plan  Home prepared for Child (including basic supplies)  Supportive family/friends    IV  RISK FACTORS AND CURRENT PROBLEMS Current Problem:  YES   Risk Factor & Current Problem Patient Issue Family Issue Risk Factor / Current Problem Comment  Mental Illness N N MOB reported history of panic attacks 8 years ago, was prescribed Paxil by her MD at the time.  She denied recent symptoms, stated last panic attack was more than 6 years ago.  MOB endorsed history of depression following birth of first son, but first son was admitted to the NICU and she was re-admitted due to kidney related issues.     V  SOCIAL WORK ASSESSMENT CSW met with MOB in her first floor room, room 111, in order to the complete assessment.  Consult ordered due to history of panic attacks.  FOB present in room throughout assessment with MOB's consent.  MOB and FOB receptive to the assessment as evidenced by maintaining consistent eye contact and being forthcoming with information.  MOB displayed a full range in affect, smiled frequently when she was talking about or looking at her newborn baby.  FOB actively participated as well, and receptive to exploring his thoughts related to role adjustment/change.   MOB and FOB expressed gratitude and happiness as this pregnancy and newborn had no complication.  MOB and FOB reflected upon past experienced when their 36 year old was born as he was admitted to the NICU for 10 days.  Family also reflected upon MOB re-admission related to kidney issues which resulted in surgery and limited exposure to the baby.  MOB and FOB denied any anxiety or memories being re-triggered, but discussed how they both were prescribed psychotropic medications as it was a stressful experience for them all.    MOB acknowledged history of panic attack, but stated that she has not had a panic attack since prior to her 36 year-old being born.  She shared some sources of anxiety, but responses normative for parents and parents of a newborn.  MOB and FOB receptive to education on PPD and anxiety, and verbalized understanding of increased risk due to maternal  history.  MOB and FOB willing to reach out to MD, and denied any concerns about reaching out for help if needed.  CSW discussed importance of daily self-care with MOB and FOB, both receptive to feedback and acknowledged importance of caring for themselves in order to better care for their children.    MOB and FOB aware of ongoing support CSW able to offer during hospitalization.  No barriers to discharge.     VI SOCIAL WORK PLAN Social Work Banker  No Further Intervention Required / No Barriers to Discharge   Type of pt/family education:   PPD and anxiety   If child protective services report - county:   If child protective services report - date:   Information/referral to community resources comment:  Contact MD if symptoms of PPD are experienced.

## 2014-06-23 NOTE — Anesthesia Postprocedure Evaluation (Signed)
Anesthesia Post Note  Patient: Destiny Mitchell  Procedure(s) Performed: * No procedures listed *  Anesthesia type: Epidural  Patient location: Mother/Baby  Post pain: Pain level controlled  Post assessment: Post-op Vital signs reviewed  Last Vitals:  Filed Vitals:   06/23/14 0550  BP: 127/82  Pulse: 68  Temp: 36.5 C  Resp: 18    Post vital signs: Reviewed  Level of consciousness: awake  Complications: No apparent anesthesia complications

## 2014-06-24 MED ORDER — OXYCODONE-ACETAMINOPHEN 5-325 MG PO TABS: 1.0000 | ORAL_TABLET | Freq: Four times a day (QID) | ORAL | Status: DC | PRN

## 2014-06-24 MED ORDER — PRENATAL MULTIVITAMIN CH: 1.0000 | ORAL_TABLET | Freq: Every day | ORAL | Status: DC

## 2014-06-24 MED ORDER — IBUPROFEN 800 MG PO TABS: 800.0000 mg | ORAL_TABLET | Freq: Three times a day (TID) | ORAL | Status: DC | PRN

## 2014-06-24 NOTE — Progress Notes (Signed)
Post Partum Day 2 Subjective: no complaints, up ad lib, voiding, tolerating PO and nl lochia, pain controlled  Objective: Blood pressure 160/60, pulse 66, temperature 98 F (36.7 C), temperature source Oral, resp. rate 18, height 5\' 6"  (1.676 m), weight 82.725 kg (182 lb 6 oz), SpO2 99.00%, unknown if currently breastfeeding.  Physical Exam:  General: alert and no distress Lochia: appropriate Uterine Fundus: firm   Recent Labs  06/22/14 0750 06/23/14 0633  HGB 12.9 12.1  HCT 37.2 35.6*    Assessment/Plan: Discharge home, Breastfeeding and Lactation consult.  D/c with Motrin, percocet and PNV - f/u 6 weeks   LOS: 2 days   Bovard-Stuckert, Layken Doenges 06/24/2014, 8:14 AM

## 2014-06-24 NOTE — Discharge Summary (Signed)
Obstetric Discharge Summary Reason for Admission: onset of labor Prenatal Procedures: none Intrapartum Procedures: spontaneous vaginal delivery Postpartum Procedures: none Complications-Operative and Postpartum: 2nd  degree perineal laceration Hemoglobin  Date Value Ref Range Status  06/23/2014 12.1  12.0 - 15.0 g/dL Final     HCT  Date Value Ref Range Status  06/23/2014 35.6* 36.0 - 46.0 % Final    Physical Exam:  General: alert and no distress Lochia: appropriate Uterine Fundus: firm  Discharge Diagnoses: Term Pregnancy-delivered  Discharge Information: Date: 06/24/2014 Activity: pelvic rest Diet: routine Medications: PNV, Ibuprofen and Percocet Condition: stable Instructions: refer to practice specific booklet Discharge to: home Follow-up Information   Follow up with Bovard-Stuckert, Brandelyn Henne, MD. Schedule an appointment as soon as possible for a visit in 6 weeks. (for postpartum check)    Specialty:  Obstetrics and Gynecology   Contact information:   [address] [address] [phone]       Newborn Data: Live born female  Birth Weight: 6 lb 15 oz (3147 g) APGAR: 9, 9  Home with mother.  Bovard-Stuckert, Nakiesha Rumsey 06/24/2014, 8:57 AM

## 2014-06-24 NOTE — Lactation Note (Signed)
This note was copied from the chart of Destiny Mitchell. Lactation Consultation Note  Patient Name: Destiny Noga Fogg [id number] Date: 06/24/2014 Reason for consult: Follow-up assessment;Late preterm infant Mom has decided to formula and bottle feed. Considering pump and bottle. Has DEBP for home use if needed but has not done any pumping while here in hospital. Engorgement care reviewed if needed. Advised Mom to call for questions or concerns.   Maternal Data    Feeding    LATCH Score/Interventions                      Lactation Tools Discussed/Used     Consult Status Consult Status: Complete Date: 06/24/14 Follow-up type: In-patient    Katrine Coho 06/24/2014, 10:33 AM

## 2014-07-28 ENCOUNTER — Ambulatory Visit (HOSPITAL_COMMUNITY)
Admission: RE | Admit: 2014-07-28 | Discharge: 2014-07-28 | Disposition: A | Discharge status: Home / Self Care | Payer: 59 | Source: Ambulatory Visit | Attending: Obstetrics and Gynecology | Admitting: Obstetrics and Gynecology

## 2014-07-28 NOTE — Lactation Note (Signed)
Lactation Consult  Mother's reason for visit:  Visit Type:  Feeding assessment and assessment for sore nipples ,  Appointment Notes: R/O yeast , sore nipples about a week , Baby one month old, feeding 3-5 times at the breast , otherwise pumping. And bottle feeding . Started with #27 flange , increased to #30 , and has been painful with #30. Plan to come to the store today 8/31 to increase size #36.  LC recommended calling for all purpose nipple cream. DR. Melba Coon . Confirmed apt. Consult:  Initial Lactation Consultant:  Myer Haff  ________________________________________________________________________ Sharene Skeans Name: Nadene Rubins  Date of Birth: 06/22/2012  Pediatrician: Dr. Wilfred Lacy   Gender: female  Gestational Age: [redacted]w[redacted]d (At Birth)  Birth Weight: 6 lb 15 oz (3147 g)  Weight at Discharge: Weight: 6 lb 10.2 oz (3010 g) Date of Discharge: 06/24/2014  [facility] Weights   06/22/14 1321 06/23/14 0001 06/23/14 2357  Weight: 6 lb 15 oz (3147 g) 6 lb 14.2 oz (3125 g) 6 lb 10.2 oz (3010 g)  Last weight taken from location outside of [facility]: 8/26 8-1 oz  Location:Pediatrician's office  Weight today: 8-11.6 oz 3956g     ________________________________________________________________________  Mother's Name: [name] Type of delivery:  Vaginal  Breastfeeding Experience:  1st baby was in NICU , pumped for  8 weeks , 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 This all occurred with 1st baby. Maternal Medical Conditions:  Non risk  Maternal Medications:  PNV   ________________________________________________________________________  Breastfeeding History (Post Discharge) - per mom baby has been breast feeding well and taking a bottle well. Soreness started a week ago.   Frequency of breastfeeding:  2-3 tines day ,  Duration of feeding:  10 mins one breast   Pumping - with DEBP Medela every 3-4 hours both breast with EBM yield -  75-95 ml off each breast consistently   Infant Intake and Output Assessment  Voids:  8-10  in 24 hrs.  Color:  Clear yellow Stools:  6-8 yellow seedy  in 24 hrs.  Color:  Yellow  ________________________________________________________________________  Maternal Breast Assessment- per mom no nipple pain  between pumping or feeding.  Mom denies plugged ducts, engorgement or mastitis. Per mom the discomfort at the base of the nipples and nipple started about a week ago.  LC assessed breast tissue and nipples, breast are full , ( per mom last pumped at 0700 ), It is now 50 30. Normal fullness noted 3 1/2 hours, bruising noticed at the base of each nipple at the junction  Of the nipple and areola also small area on each nipple. Nipples light pink in color . No raised bumpy rash noted. Mom comfort able with areola compression and latching  and depth at the breast. Per mom called Dr. Roe Rutherford office and was called in a prescription for "All- Purpose nipple cream "  And has been using it some. LC suspected Yeast , ( 5 weeks ago mom was on antibiotics for a sinus infection) . Mom denies hx of yeast infections. Or suspects if the breast  is to full when Rolla Plate goes to latch is lying off the areola to the nipple/ areola junction. The bruised elevated area on the nipples maybe from to smaller of a flange and also the bruising  At the base of the nipple areola junction. See below for Lactation Plan of care .   Breast:  Full Nipple:  Erect Pain level:  0 Pain interventions:  All purpose nipple cream  _______________________________________________________________________ Feeding Assessment/Evaluation  Initial feeding assessment: Baby awake and hungry, last fed at 630 am per mom and color pink. Latched with  depth , consistent pattern with multiply swallows , increased with breast compressions.   Infant's oral assessment:  Variance, that doesn't cause challenge with latch , but noticed a short labial  frenulum  Rolla Plate is able to stretch is tongue over gum line. And transfer milk well from the breast.  Positioning:  Cross cradle Left breast  LATCH documentation:  Latch:  2 = Grasps breast easily, tongue down, lips flanged, rhythmical sucking.  Audible swallowing:  2 = Spontaneous and intermittent  Type of nipple:  2 = Everted at rest and after stimulation  Comfort (Breast/Nipple):  1 = Filling, red/small blisters or bruises, mild/mod discomfort ( full )   Hold (Positioning):  2 = No assistance needed to correctly position infant at breast  LATCH score:  9   Attached assessment:  Deep  Lips flanged:  Yes.    Lips untucked:  Yes.    Suck assessment:  Nutritive  Tools:  Pump Instructed on use and cleaning of tool:  No. ( mom aware )   Pre-feed weight:  3956 g  (8  lb. 11.6  oz.) Post-feed weight:  4014  g (8 lb. 13.6  oz.) Amount transferred:  58  ml Amount supplemented:  None , not needed   Additional Feeding Assessment -  Logan latched well , changing position to football with depth ,  multiply swallows , and consistent pattern, increased with breast compressions. After feeding mom mentioned she had some intermittent discomfort , but for the most part has been more  Comfortable compared to home feedings and seemed lik he was latched deeper.   Infant's oral assessment:  Variance, short labial frenulum not causing challenges with latch and depth at the breast. Rolla Plate is able to stretch tongue over gum line   Positioning:  Football Right breast  LATCH documentation:  Latch:  2 = Grasps breast easily, tongue down, lips flanged, rhythmical sucking.  Audible swallowing:  2 = Spontaneous and intermittent  Type of nipple:  2 = Everted at rest and after stimulation  Comfort (Breast/Nipple):  2 = Soft / non-tender  Hold (Positioning):  2 = No assistance needed to correctly position infant at breast  LATCH score:  10   Attached assessment:  Deep  Lips flanged:  Yes.    Lips  untucked:  Yes.    Suck assessment:  Nutritive  Tools:  Pump  ( for post pump after feeding assessment )  Instructed on use and cleaning of tool:  No. Wet diaper - re- weight -  Pre-feed weight:  3982  g  (8  lb. 12.5  oz.) Post-feed weight:  4024 g (8 lb. 13.9  oz.) Amount transferred:  42  ml Amount supplemented: 20  Ml ( EBM ) , Logan still hungry and mom needed to post pump to has LC evaluate flanges    Total amount pumped post feed:  R 57ml    L 58  Ml   Total amount transferred:  100  ml Total supplement given:20 ml- EBM   Total for feeding - 120 ml ( 4 ozs)   Lactation Consultant Summary - Baby latches well with depth. Mom pumped with #30 flange, at 1st looked like  the proper size , and when the suction increased the base of the  nipple appeared tight. Increased to #36 the  base of the areola didn't seem tight. Mom denied discomfort with the #30. LC recommended using small amount olive oil  Or coconut oil to base of areola before pumping and see if it helps decease the friction with pumping. Continue the "All purpose nipple cream until the soreness goes away with plan below. Baby is asymptomatic of yeast , and mom doesn't have the typical signs except soreness.  Lactation Plan of Care - Praised mom for her efforts breast feeding and pumping                                         - Mom - plenty fluids , especially water , nutritious snacks and meals                                          - Feedings - every 21/2 - 3 hours , growth spurt at 6 weeks , cluster feeding is normal                                          - prior to latch - if breast are to full , release by hand expressing                                          - Areola needs to be compressible like a thinner sandwich prior to latch                    Feedings  and pumping to protect established milk supply should equal at least 8  Times a day                    Pumping- 15 -20 mins both breast with breast  compressions after feedings                    If Logan sleeps 4 hours at night should set alarm to check breast and pump if needed.                    Breastfeeding support Monday's at 7pm or Tuesdays at 11am at [facility]                    Per mom Dr. Wilfred Lacy apt this Friday

## 2014-07-29 ENCOUNTER — Ambulatory Visit (HOSPITAL_COMMUNITY): Payer: 59

## 2014-09-27 ENCOUNTER — Encounter (HOSPITAL_COMMUNITY): Payer: Self-pay

## 2015-01-25 ENCOUNTER — Telehealth: Payer: 59 | Admitting: Nurse Practitioner

## 2015-01-25 DIAGNOSIS — J0101 Acute recurrent maxillary sinusitis: Secondary | ICD-10-CM

## 2015-01-25 MED ORDER — AZITHROMYCIN 250 MG PO TABS: ORAL_TABLET | ORAL | Status: DC

## 2015-01-25 NOTE — Progress Notes (Signed)

## 2015-11-16 ENCOUNTER — Telehealth: Payer: 59 | Admitting: Nurse Practitioner

## 2015-11-16 DIAGNOSIS — J0100 Acute maxillary sinusitis, unspecified: Secondary | ICD-10-CM

## 2015-11-16 MED ORDER — AZITHROMYCIN 250 MG PO TABS: ORAL_TABLET | ORAL | Status: DC

## 2015-11-16 NOTE — Progress Notes (Signed)

## 2015-12-19 ENCOUNTER — Other Ambulatory Visit: Payer: Self-pay | Admitting: Obstetrics and Gynecology

## 2015-12-19 DIAGNOSIS — N6310 Unspecified lump in the right breast, unspecified quadrant: Secondary | ICD-10-CM

## 2015-12-26 ENCOUNTER — Ambulatory Visit
Admission: RE | Admit: 2015-12-26 | Discharge: 2015-12-26 | Disposition: A | Discharge status: Home / Self Care | Payer: 59 | Source: Ambulatory Visit | Attending: Obstetrics and Gynecology | Admitting: Obstetrics and Gynecology

## 2015-12-26 DIAGNOSIS — R922 Inconclusive mammogram: Secondary | ICD-10-CM | POA: Diagnosis not present

## 2015-12-26 DIAGNOSIS — N63 Unspecified lump in breast: Secondary | ICD-10-CM | POA: Diagnosis not present

## 2015-12-26 DIAGNOSIS — N6310 Unspecified lump in the right breast, unspecified quadrant: Secondary | ICD-10-CM

## 2016-01-02 DIAGNOSIS — H5213 Myopia, bilateral: Secondary | ICD-10-CM | POA: Diagnosis not present

## 2016-01-06 DIAGNOSIS — L812 Freckles: Secondary | ICD-10-CM | POA: Diagnosis not present

## 2016-01-06 DIAGNOSIS — D2239 Melanocytic nevi of other parts of face: Secondary | ICD-10-CM | POA: Diagnosis not present

## 2016-01-06 DIAGNOSIS — L738 Other specified follicular disorders: Secondary | ICD-10-CM | POA: Diagnosis not present

## 2016-01-06 DIAGNOSIS — Z85828 Personal history of other malignant neoplasm of skin: Secondary | ICD-10-CM | POA: Diagnosis not present

## 2016-01-06 DIAGNOSIS — L905 Scar conditions and fibrosis of skin: Secondary | ICD-10-CM | POA: Diagnosis not present

## 2016-01-06 DIAGNOSIS — L720 Epidermal cyst: Secondary | ICD-10-CM | POA: Diagnosis not present

## 2016-01-06 DIAGNOSIS — D225 Melanocytic nevi of trunk: Secondary | ICD-10-CM | POA: Diagnosis not present

## 2016-01-06 DIAGNOSIS — L821 Other seborrheic keratosis: Secondary | ICD-10-CM | POA: Diagnosis not present

## 2016-01-06 DIAGNOSIS — D2261 Melanocytic nevi of right upper limb, including shoulder: Secondary | ICD-10-CM | POA: Diagnosis not present

## 2016-01-06 DIAGNOSIS — D485 Neoplasm of uncertain behavior of skin: Secondary | ICD-10-CM | POA: Diagnosis not present

## 2016-01-06 DIAGNOSIS — D2271 Melanocytic nevi of right lower limb, including hip: Secondary | ICD-10-CM | POA: Diagnosis not present

## 2016-01-06 DIAGNOSIS — D2262 Melanocytic nevi of left upper limb, including shoulder: Secondary | ICD-10-CM | POA: Diagnosis not present

## 2016-01-07 ENCOUNTER — Telehealth: Payer: 59 | Admitting: Physician Assistant

## 2016-01-07 DIAGNOSIS — B9689 Other specified bacterial agents as the cause of diseases classified elsewhere: Secondary | ICD-10-CM

## 2016-01-07 DIAGNOSIS — J019 Acute sinusitis, unspecified: Secondary | ICD-10-CM

## 2016-01-07 MED ORDER — FLUTICASONE PROPIONATE 50 MCG/ACT NA SUSP
2.0000 | Freq: Every day | NASAL | Status: DC
Start: 2016-01-07 — End: 2017-01-10

## 2016-01-07 MED ORDER — AMOXICILLIN-POT CLAVULANATE 875-125 MG PO TABS: 1.0000 | ORAL_TABLET | Freq: Two times a day (BID) | ORAL | Status: DC

## 2016-01-07 NOTE — Progress Notes (Signed)
We are sorry that you are not feeling well.  Here is how we plan to help!  Based on what you have shared with me it looks like you have sinusitis.  Sinusitis is inflammation and infection in the sinus cavities of the head.  Based on your presentation I believe you most likely have Acute Bacterial Sinusitis.  This is an infection caused by bacteria and is treated with antibiotics. I have prescribed Augmentin 875mg /125mg  one tablet twice daily with food, for 7 days. You may use an oral decongestant such as Mucinex D or if you have glaucoma or high blood pressure use plain Mucinex. Saline nasal spray help and can safely be used as often as needed for congestion.  If you develop worsening sinus pain, fever or notice severe headache and vision changes, or if symptoms are not better after completion of antibiotic, please schedule an appointment with a health care provider.    I have also sent in a prescription for fluticasone nasal spray -- 2 sprays to each nostril once daily.  Sinus infections are not as easily transmitted as other respiratory infection, however we still recommend that you avoid close contact with loved ones, especially the very young and elderly.  Remember to wash your hands thoroughly throughout the day as this is the number one way to prevent the spread of infection!  Home Care:  Only take medications as instructed by your medical team.  Complete the entire course of an antibiotic.  Do not take these medications with alcohol.  A steam or ultrasonic humidifier can help congestion.  You can place a towel over your head and breathe in the steam from hot water coming from a faucet.  Avoid close contacts especially the very young and the elderly.  Cover your mouth when you cough or sneeze.  Always remember to wash your hands.  Get Help Right Away If:  You develop worsening fever or sinus pain.  You develop a severe head ache or visual changes.  Your symptoms persist after you  have completed your treatment plan.  Make sure you  Understand these instructions.  Will watch your condition.  Will get help right away if you are not doing well or get worse.  Your e-visit answers were reviewed by a board certified advanced clinical practitioner to complete your personal care plan.  Depending on the condition, your plan could have included both over the counter or prescription medications.  If there is a problem please reply  once you have received a response from your provider.  Your safety is important to [place].  If you have drug allergies check your prescription carefully.    You can use MyChart to ask questions about today's visit, request a non-urgent call back, or ask for a work or school excuse for 24 hours related to this e-Visit. If it has been greater than 24 hours you will need to follow up with your provider, or enter a new e-Visit to address those concerns.  You will get an e-mail in the next two days asking about your experience.  I hope that your e-visit has been valuable and will speed your recovery. Thank you for using e-visits.

## 2016-01-16 ENCOUNTER — Telehealth: Payer: 59 | Admitting: Family

## 2016-01-16 DIAGNOSIS — N760 Acute vaginitis: Secondary | ICD-10-CM | POA: Diagnosis not present

## 2016-01-16 MED ORDER — FLUCONAZOLE 150 MG PO TABS: 150.0000 mg | ORAL_TABLET | Freq: Once | ORAL | Status: DC

## 2016-01-16 MED FILL — FLUCONAZOLE 150 MG TABLET: 150 | 1 days supply | Qty: 1 | Fill #0

## 2016-01-16 NOTE — Progress Notes (Signed)

## 2016-02-09 DIAGNOSIS — Z1389 Encounter for screening for other disorder: Secondary | ICD-10-CM | POA: Diagnosis not present

## 2016-02-09 DIAGNOSIS — Z130 Encounter for screening for diseases of the blood and blood-forming organs and certain disorders involving the immune mechanism: Secondary | ICD-10-CM | POA: Diagnosis not present

## 2016-02-09 DIAGNOSIS — Z01419 Encounter for gynecological examination (general) (routine) without abnormal findings: Secondary | ICD-10-CM | POA: Diagnosis not present

## 2016-02-09 DIAGNOSIS — Z6824 Body mass index (BMI) 24.0-24.9, adult: Secondary | ICD-10-CM | POA: Diagnosis not present

## 2016-02-09 DIAGNOSIS — Z3009 Encounter for other general counseling and advice on contraception: Secondary | ICD-10-CM | POA: Diagnosis not present

## 2016-05-07 MED FILL — metroNIDAZOLE 0.75 % GEL: 0.75 | 30 days supply | Qty: 45 | Fill #1

## 2016-06-07 DIAGNOSIS — D485 Neoplasm of uncertain behavior of skin: Secondary | ICD-10-CM | POA: Diagnosis not present

## 2016-06-07 DIAGNOSIS — Z85828 Personal history of other malignant neoplasm of skin: Secondary | ICD-10-CM | POA: Diagnosis not present

## 2016-06-07 DIAGNOSIS — D2272 Melanocytic nevi of left lower limb, including hip: Secondary | ICD-10-CM | POA: Diagnosis not present

## 2016-07-13 DIAGNOSIS — D224 Melanocytic nevi of scalp and neck: Secondary | ICD-10-CM | POA: Diagnosis not present

## 2016-07-13 DIAGNOSIS — L738 Other specified follicular disorders: Secondary | ICD-10-CM | POA: Diagnosis not present

## 2016-07-13 DIAGNOSIS — D225 Melanocytic nevi of trunk: Secondary | ICD-10-CM | POA: Diagnosis not present

## 2016-07-13 DIAGNOSIS — D1801 Hemangioma of skin and subcutaneous tissue: Secondary | ICD-10-CM | POA: Diagnosis not present

## 2016-07-13 DIAGNOSIS — Z85828 Personal history of other malignant neoplasm of skin: Secondary | ICD-10-CM | POA: Diagnosis not present

## 2016-07-31 DIAGNOSIS — Z8051 Family history of malignant neoplasm of kidney: Secondary | ICD-10-CM | POA: Diagnosis not present

## 2016-07-31 DIAGNOSIS — Z85828 Personal history of other malignant neoplasm of skin: Secondary | ICD-10-CM | POA: Diagnosis not present

## 2016-07-31 DIAGNOSIS — Z808 Family history of malignant neoplasm of other organs or systems: Secondary | ICD-10-CM | POA: Diagnosis not present

## 2016-07-31 DIAGNOSIS — D2270 Melanocytic nevi of unspecified lower limb, including hip: Secondary | ICD-10-CM | POA: Diagnosis not present

## 2016-12-03 ENCOUNTER — Telehealth: Payer: 59 | Admitting: Family

## 2016-12-03 DIAGNOSIS — J111 Influenza due to unidentified influenza virus with other respiratory manifestations: Secondary | ICD-10-CM | POA: Diagnosis not present

## 2016-12-03 MED ORDER — OSELTAMIVIR PHOSPHATE 75 MG PO CAPS: 75.0000 mg | ORAL_CAPSULE | Freq: Two times a day (BID) | ORAL | 0 refills | Status: DC

## 2016-12-03 MED FILL — OSELTAMIVIR PHOS 75 MG CAP: 75 | 5 days supply | Qty: 10 | Fill #0

## 2016-12-03 NOTE — Progress Notes (Signed)
E visit for Flu like symptoms   We are sorry that you are not feeling well.  Here is how we plan to help! Based on what you have shared with me it looks like you may have a respiratory virus that may be influenza.  Influenza or "the flu" is   an infection caused by a respiratory virus. The flu virus is highly contagious and persons who did not receive their yearly flu vaccination may "catch" the flu from close contact.  We have anti-viral medications to treat the viruses that cause this infection. They are not a "cure" and only shorten the course of the infection. These prescriptions are most effective when they are given within the first 2 days of "flu" symptoms. Antiviral medication are indicated if you have a high risk of complications from the flu. You should  also consider an antiviral medication if you are in close contact with someone who is at risk. These medications can help patients avoid complications from the flu  but have side effects that you should know. Possible side effects from Tamiflu or oseltamivir include nausea, vomiting, diarrhea, dizziness, headaches, eye redness, sleep problems or other respiratory symptoms. You should not take Tamiflu if you have an allergy to oseltamivir or any to the ingredients in Tamiflu.  Based upon your symptoms and potential risk factors I have prescribed Oseltamivir (Tamiflu).  It has been sent to your designated pharmacy.  You will take one 75 mg capsule orally twice a day for the next 5 days.   Regarding your children, they cannot be treated through the evisit program but can be done through the telephone with [facility] other services:  http://connectnow.[employer].co.za  ANYONE WHO HAS FLU SYMPTOMS SHOULD: . Stay home. The flu is highly contagious and going out or to work exposes others! . Be sure to drink plenty of fluids. Water is fine as well as fruit juices, sodas and electrolyte  beverages. You may want to stay away from caffeine or alcohol. If you are nauseated, try taking small sips of liquids. How do you know if you are getting enough fluid? Your urine should be a pale yellow or almost colorless. . Get rest. . Taking a steamy shower or using a humidifier may help nasal congestion and ease sore throat pain. Using a saline nasal spray works much the same way. . Cough drops, hard candies and sore throat lozenges may ease your cough. . Line up a caregiver. Have someone check on you regularly.   GET HELP RIGHT AWAY IF: . You cannot keep down liquids or your medications. . You become short of breath . Your fell like you are going to pass out or loose consciousness. . Your symptoms persist after you have completed your treatment plan MAKE SURE YOU   Understand these instructions.  Will watch your condition.  Will get help right away if you are not doing well or get worse.  Your e-visit answers were reviewed by a board certified advanced clinical practitioner to complete your personal care plan.  Depending on the condition, your plan could have included both over the counter or prescription medications.  If there is a problem please reply  once you have received a response from your provider.  Your safety is important to [place].  If you have drug allergies check your prescription carefully.    You can use MyChart to ask questions about today's visit, request a non-urgent call back, or ask for a work or school excuse for 24 hours  related to this e-Visit. If it has been greater than 24 hours you will need to follow up with your provider, or enter a new e-Visit to address those concerns.  You will get an e-mail in the next two days asking about your experience.  I hope that your e-visit has been valuable and will speed your recovery. Thank you for using e-visits.

## 2016-12-14 ENCOUNTER — Telehealth: Payer: 59 | Admitting: Family

## 2016-12-14 DIAGNOSIS — J329 Chronic sinusitis, unspecified: Secondary | ICD-10-CM

## 2016-12-14 DIAGNOSIS — B9689 Other specified bacterial agents as the cause of diseases classified elsewhere: Secondary | ICD-10-CM | POA: Diagnosis not present

## 2016-12-14 MED ORDER — AMOXICILLIN-POT CLAVULANATE 875-125 MG PO TABS: 1.0000 | ORAL_TABLET | Freq: Two times a day (BID) | ORAL | 0 refills | Status: AC

## 2016-12-14 MED FILL — AMOX-CLAV 875-125 MG TABLET: 875-125 | 7 days supply | Qty: 14 | Fill #0

## 2016-12-14 NOTE — Progress Notes (Signed)

## 2016-12-18 ENCOUNTER — Telehealth: Payer: 59 | Admitting: Family

## 2016-12-18 DIAGNOSIS — B9689 Other specified bacterial agents as the cause of diseases classified elsewhere: Secondary | ICD-10-CM | POA: Diagnosis not present

## 2016-12-18 DIAGNOSIS — J019 Acute sinusitis, unspecified: Secondary | ICD-10-CM

## 2016-12-18 MED ORDER — AZITHROMYCIN 250 MG PO TABS: ORAL_TABLET | ORAL | 0 refills | Status: DC

## 2016-12-18 MED FILL — AZITHROMYCIN 250 MG TABLET: 250 | 5 days supply | Qty: 6 | Fill #0

## 2016-12-18 NOTE — Progress Notes (Signed)
We are sorry that you are not feeling well.  Here is how we plan to help!  Stop Augmentin   Based on what you have shared with me it looks like you have sinusitis.  Sinusitis is inflammation and infection in the sinus cavities of the head.  Based on your presentation I believe you most likely have Acute Bacterial Sinusitis.  This is an infection caused by bacteria and is treated with antibiotics. I have prescribed Zpak. You may use an oral decongestant such as Mucinex D or if you have glaucoma or high blood pressure use plain Mucinex. Saline nasal spray help and can safely be used as often as needed for congestion.  If you develop worsening sinus pain, fever or notice severe headache and vision changes, or if symptoms are not better after completion of antibiotic, please schedule an appointment with a health care provider.    Sinus infections are not as easily transmitted as other respiratory infection, however we still recommend that you avoid close contact with loved ones, especially the very young and elderly.  Remember to wash your hands thoroughly throughout the day as this is the number one way to prevent the spread of infection!  Home Care:  Only take medications as instructed by your medical team.  Complete the entire course of an antibiotic.  Do not take these medications with alcohol.  A steam or ultrasonic humidifier can help congestion.  You can place a towel over your head and breathe in the steam from hot water coming from a faucet.  Avoid close contacts especially the very young and the elderly.  Cover your mouth when you cough or sneeze.  Always remember to wash your hands.  Get Help Right Away If:  You develop worsening fever or sinus pain.  You develop a severe head ache or visual changes.  Your symptoms persist after you have completed your treatment plan.  Make sure you  Understand these instructions.  Will watch your condition.  Will get help right away if  you are not doing well or get worse.  Your e-visit answers were reviewed by a board certified advanced clinical practitioner to complete your personal care plan.  Depending on the condition, your plan could have included both over the counter or prescription medications.  If there is a problem please reply  once you have received a response from your provider.  Your safety is important to [place].  If you have drug allergies check your prescription carefully.    You can use MyChart to ask questions about today's visit, request a non-urgent call back, or ask for a work or school excuse for 24 hours related to this e-Visit. If it has been greater than 24 hours you will need to follow up with your provider, or enter a new e-Visit to address those concerns.  You will get an e-mail in the next two days asking about your experience.  I hope that your e-visit has been valuable and will speed your recovery. Thank you for using e-visits.

## 2017-01-08 ENCOUNTER — Ambulatory Visit: Payer: Self-pay | Admitting: Podiatry

## 2017-01-10 ENCOUNTER — Ambulatory Visit (INDEPENDENT_AMBULATORY_CARE_PROVIDER_SITE_OTHER): Payer: BLUE CROSS/BLUE SHIELD | Admitting: Podiatry

## 2017-01-10 ENCOUNTER — Encounter: Payer: Self-pay | Admitting: Podiatry

## 2017-01-10 DIAGNOSIS — L603 Nail dystrophy: Secondary | ICD-10-CM | POA: Diagnosis not present

## 2017-01-10 NOTE — Progress Notes (Signed)
She presents today after having not seen her for quite some time as she gave birth to a little boy who is now 39 years old. She was pregnant the last time we saw her. She states that she is concerned about the changing colors and her toenails the thickness and the palmar nails are becoming detached.  Objective: I have reviewed her past medical history medications allergy surgery social history and review of systems. Pulses are strongly palpable neurologic sensorium is intact cutaneous evaluation demonstrates supple well-hydrated cutis no signs of athlete's foot or yeast infections. However the nails do demonstrates not only a subungual white onychomycosis but discoloration are yellow onychomycosis as well as nail dystrophy. We did have a culture done last time she was in but there is no PCR present.  Assessment: Nail dystrophy probable onychomycosis.  Plan: Debridement of the nails and skin today which are sent for pathologic evaluation more than likely will follow-up with her in 1 month.

## 2017-02-07 ENCOUNTER — Ambulatory Visit (INDEPENDENT_AMBULATORY_CARE_PROVIDER_SITE_OTHER): Payer: BLUE CROSS/BLUE SHIELD | Admitting: Podiatry

## 2017-02-07 ENCOUNTER — Encounter: Payer: Self-pay | Admitting: Podiatry

## 2017-02-07 DIAGNOSIS — L603 Nail dystrophy: Secondary | ICD-10-CM

## 2017-02-07 DIAGNOSIS — Z79899 Other long term (current) drug therapy: Secondary | ICD-10-CM

## 2017-02-07 MED ORDER — TERBINAFINE HCL 250 MG PO TABS: 250.0000 mg | ORAL_TABLET | Freq: Every day | ORAL | 0 refills | Status: DC

## 2017-02-07 MED FILL — TERBINAFINE HCL 250 MG TAB: 250 | 30 days supply | Qty: 30 | Fill #0

## 2017-02-07 NOTE — Progress Notes (Signed)
She presents today for follow-up of her onychomycosis.  Objective: Vital signs are stable she is alert and oriented 3 no change in physical exam. Pathology results do demonstrate severe onychomycosis.  Assessment: Onychomycosis.  Plan: Started her on Lamisil 250 mg tablets #30 one by mouth daily 30 days. We also requested a liver profile with requisition. Follow up with her in 1 month. We will notify her should her liver profile be abnormal.

## 2017-02-07 NOTE — Patient Instructions (Signed)

## 2017-02-11 MED FILL — DESOGESTR-ETH ESTRAD ETH ES: 0.15-0.02/0 | 28 days supply | Qty: 28 | Fill #0

## 2017-02-14 LAB — HEPATIC FUNCTION PANEL
ALK PHOS: 65 U/L (ref 33–115)
ALT: 13 U/L (ref 6–29)
AST: 18 U/L (ref 10–30)
Albumin: 4.4 g/dL (ref 3.6–5.1)
BILIRUBIN INDIRECT: 0.4 mg/dL (ref 0.2–1.2)
BILIRUBIN TOTAL: 0.5 mg/dL (ref 0.2–1.2)
Bilirubin, Direct: 0.1 mg/dL (ref <=0.2)
TOTAL PROTEIN: 7 g/dL (ref 6.1–8.1)

## 2017-02-18 ENCOUNTER — Telehealth: Payer: Self-pay | Admitting: *Deleted

## 2017-02-18 NOTE — Telephone Encounter (Addendum)
-----   Message from Garrel Ridgel, [place] sent at 02/18/2017  7:04 AM EDT ----- Liver profile looks perfect and may continue medication. Left message informing pt of Dr.Hyatt's review of blood work and orders.

## 2017-03-07 MED FILL — DESOGESTR-ETH ESTRAD ETH ES: 0.15-0.02/0 | 28 days supply | Qty: 28 | Fill #1

## 2017-03-12 ENCOUNTER — Telehealth: Payer: Self-pay | Admitting: Nurse Practitioner

## 2017-03-12 ENCOUNTER — Other Ambulatory Visit: Payer: Self-pay | Admitting: Nurse Practitioner

## 2017-03-12 DIAGNOSIS — I10 Essential (primary) hypertension: Secondary | ICD-10-CM

## 2017-03-12 MED ORDER — HYDROCHLOROTHIAZIDE 25 MG PO TABS: 25.0000 mg | ORAL_TABLET | Freq: Every day | ORAL | 3 refills | Status: DC

## 2017-03-12 MED FILL — HYDROCHLOROTHIAZIDE 25 MG T: 25 | 34 days supply | Qty: 34 | Fill #0

## 2017-03-12 NOTE — Telephone Encounter (Signed)
BP's consistently running high - has sent me several messages via EPIC and text. Has tried to cut back on salt but no improvement in BP readings.   Currently BP is 159/98  Will get BMET tomorrow. Start HCTZ 25 mg a day. RX sent to [facility].   Will need repeat lab in one week and continued monitoring of BP. May need to stop BCP.   Burtis Junes, RN, [place] [address] [place], [place]  [address] [phone]

## 2017-03-13 ENCOUNTER — Other Ambulatory Visit: Payer: BLUE CROSS/BLUE SHIELD

## 2017-03-13 ENCOUNTER — Other Ambulatory Visit: Payer: Self-pay | Admitting: *Deleted

## 2017-03-13 DIAGNOSIS — I10 Essential (primary) hypertension: Secondary | ICD-10-CM

## 2017-03-13 DIAGNOSIS — Z79899 Other long term (current) drug therapy: Secondary | ICD-10-CM

## 2017-03-13 LAB — BASIC METABOLIC PANEL
BUN/Creatinine Ratio: 17 (ref 9–23)
BUN: 14 mg/dL (ref 6–20)
CO2: 22 mmol/L (ref 18–29)
Calcium: 9.3 mg/dL (ref 8.7–10.2)
Chloride: 101 mmol/L (ref 96–106)
Creatinine, Ser: 0.82 mg/dL (ref 0.57–1.00)
GFR calc Af Amer: 105 mL/min/{1.73_m2} (ref >59)
GFR calc non Af Amer: 91 mL/min/{1.73_m2} (ref >59)
Glucose: 102 mg/dL — ABNORMAL HIGH (ref 65–99)
Potassium: 4.5 mmol/L (ref 3.5–5.2)
Sodium: 139 mmol/L (ref 134–144)

## 2017-03-14 ENCOUNTER — Encounter: Payer: Self-pay | Admitting: Podiatry

## 2017-03-14 ENCOUNTER — Ambulatory Visit (INDEPENDENT_AMBULATORY_CARE_PROVIDER_SITE_OTHER): Payer: BLUE CROSS/BLUE SHIELD | Admitting: Podiatry

## 2017-03-14 DIAGNOSIS — L603 Nail dystrophy: Secondary | ICD-10-CM | POA: Diagnosis not present

## 2017-03-14 DIAGNOSIS — Z79899 Other long term (current) drug therapy: Secondary | ICD-10-CM

## 2017-03-14 MED ORDER — TERBINAFINE HCL 250 MG PO TABS: 250.0000 mg | ORAL_TABLET | Freq: Every day | ORAL | 0 refills | Status: DC

## 2017-03-14 MED FILL — MUPIROCIN 2% OINTMENT: 2 | 15 days supply | Qty: 22 | Fill #0

## 2017-03-14 MED FILL — TERBINAFINE HCL 250 MG TAB: 250 | 30 days supply | Qty: 30 | Fill #0

## 2017-03-14 NOTE — Progress Notes (Signed)
She presents today for follow-up of her toenail fungus treatment. She states that she has finished her first 30 days of Lamisil with no side effects. She denies fever chills nausea vomiting muscle aches pains rashes or itching.  Objective: Vital signs are stable she is alert and oriented 3. No change in her physical exam for nail plates.  Assessment: Onychomycosis long-term therapy.  Plan: Request a liver profile and dispensed a 90 day prescription will follow up with her in 4 months.

## 2017-03-19 ENCOUNTER — Telehealth: Payer: BLUE CROSS/BLUE SHIELD | Admitting: Family

## 2017-03-19 DIAGNOSIS — B9689 Other specified bacterial agents as the cause of diseases classified elsewhere: Secondary | ICD-10-CM

## 2017-03-19 DIAGNOSIS — J329 Chronic sinusitis, unspecified: Secondary | ICD-10-CM

## 2017-03-19 MED ORDER — AMOXICILLIN-POT CLAVULANATE 875-125 MG PO TABS: 1.0000 | ORAL_TABLET | Freq: Two times a day (BID) | ORAL | 0 refills | Status: AC

## 2017-03-19 MED FILL — AMOX-CLAV 875-125 MG TABLET: 875-125 | 7 days supply | Qty: 14 | Fill #0

## 2017-03-19 NOTE — Progress Notes (Signed)

## 2017-03-27 ENCOUNTER — Other Ambulatory Visit: Payer: BLUE CROSS/BLUE SHIELD

## 2017-03-29 ENCOUNTER — Telehealth: Payer: BLUE CROSS/BLUE SHIELD | Admitting: Family

## 2017-03-29 DIAGNOSIS — B373 Candidiasis of vulva and vagina: Secondary | ICD-10-CM

## 2017-03-29 DIAGNOSIS — B3731 Acute candidiasis of vulva and vagina: Secondary | ICD-10-CM

## 2017-03-29 MED ORDER — FLUCONAZOLE 150 MG PO TABS: 150.0000 mg | ORAL_TABLET | Freq: Once | ORAL | 0 refills | Status: AC

## 2017-03-29 MED FILL — FLUCONAZOLE 150 MG TABLET: 150 | 1 days supply | Qty: 1 | Fill #0

## 2017-03-29 NOTE — Progress Notes (Signed)

## 2017-04-04 LAB — HEPATIC FUNCTION PANEL
ALT: 9 U/L (ref 6–29)
AST: 17 U/L (ref 10–30)
Albumin: 4.1 g/dL (ref 3.6–5.1)
Alkaline Phosphatase: 72 U/L (ref 33–115)
BILIRUBIN DIRECT: 0.1 mg/dL (ref <=0.2)
BILIRUBIN INDIRECT: 0.4 mg/dL (ref 0.2–1.2)
BILIRUBIN TOTAL: 0.5 mg/dL (ref 0.2–1.2)
Total Protein: 7.1 g/dL (ref 6.1–8.1)

## 2017-04-08 MED FILL — HYDROCHLOROTHIAZIDE 25 MG T: 25 | 34 days supply | Qty: 34 | Fill #1

## 2017-04-10 ENCOUNTER — Telehealth: Payer: Self-pay | Admitting: *Deleted

## 2017-04-10 NOTE — Telephone Encounter (Addendum)
-----   Message from Garrel Ridgel, [place] sent at 04/08/2017  5:04 PM EDT ----- Blood work looks good and may continue medication. 04/10/2017-Left message informing pt of Dr. Stephenie Acres orders.

## 2017-04-29 MED FILL — TERBINAFINE HCL 250 MG TAB: 250 | 30 days supply | Qty: 30 | Fill #1

## 2017-05-02 MED FILL — predniSONE 20 MG TABS: 20 | 4 days supply | Qty: 6 | Fill #0

## 2017-05-14 MED FILL — HYDROCHLOROTHIAZIDE 25 MG T: 25 | 34 days supply | Qty: 34 | Fill #2

## 2017-05-21 DIAGNOSIS — C44310 Basal cell carcinoma of skin of unspecified parts of face: Secondary | ICD-10-CM | POA: Diagnosis not present

## 2017-05-23 MED FILL — TERBINAFINE HCL 250 MG TAB: 250 | 30 days supply | Qty: 30 | Fill #2

## 2017-06-08 ENCOUNTER — Telehealth: Payer: Self-pay | Admitting: Nurse Practitioner

## 2017-06-08 NOTE — Telephone Encounter (Signed)
Message received regarding BP's - running low - will cut HCTZ in half and she will continue to monitor.   Burtis Junes, RN, [place] [address] [place], [place]  [phone]

## 2017-06-26 MED FILL — HYDROCHLOROTHIAZIDE 25 MG T: 25 | 34 days supply | Qty: 34 | Fill #3

## 2017-07-16 ENCOUNTER — Ambulatory Visit: Payer: BLUE CROSS/BLUE SHIELD | Admitting: Podiatry

## 2017-08-01 MED FILL — HYDROCHLOROTHIAZIDE 25 MG T: 25 | 34 days supply | Qty: 34 | Fill #4

## 2017-08-06 ENCOUNTER — Ambulatory Visit (INDEPENDENT_AMBULATORY_CARE_PROVIDER_SITE_OTHER): Payer: BLUE CROSS/BLUE SHIELD | Admitting: Podiatry

## 2017-08-06 ENCOUNTER — Encounter: Payer: Self-pay | Admitting: Podiatry

## 2017-08-06 DIAGNOSIS — Z79899 Other long term (current) drug therapy: Secondary | ICD-10-CM | POA: Diagnosis not present

## 2017-08-06 DIAGNOSIS — L603 Nail dystrophy: Secondary | ICD-10-CM | POA: Diagnosis not present

## 2017-08-06 MED ORDER — TERBINAFINE HCL 250 MG PO TABS: ORAL_TABLET | ORAL | 0 refills | Status: DC

## 2017-08-06 NOTE — Progress Notes (Signed)
She presents today for follow-up of her Lamisil therapy. She has completed her 120 days of therapy with no complications. She states that I think they're looking much better. She states that she injured her hallux nail right foot and it has grown off completely.  Objective: Vital signs are stable alert and oriented 3. Pulses are palpable area toenails are covered with pain today however the distalmost aspect of the nails do not demonstrate any major thickening due to onychomycosis.  Assessment: Onychomycosis long-term therapy with Lamisil.  Plan: Encouraged her continue Lamisil therapy for another 2 months one tablet every other day and then I will follow up with her on the third month. I instructed her to remove the nail polish prior to her next visit.

## 2017-09-09 MED FILL — HYDROCHLOROTHIAZIDE 25 MG T: 25 | 34 days supply | Qty: 34 | Fill #5

## 2017-09-11 MED FILL — TERBINAFINE HCL 250 MG TAB: 250 | 30 days supply | Qty: 15 | Fill #0

## 2017-10-11 MED FILL — HYDROCHLOROTHIAZIDE 25 MG T: 25 | 34 days supply | Qty: 34 | Fill #6

## 2017-11-05 ENCOUNTER — Ambulatory Visit: Payer: BLUE CROSS/BLUE SHIELD | Admitting: Podiatry

## 2017-11-21 MED FILL — HYDROCHLOROTHIAZIDE 25 MG T: 25 | 34 days supply | Qty: 34 | Fill #7

## 2017-12-06 DIAGNOSIS — M20012 Mallet finger of left finger(s): Secondary | ICD-10-CM | POA: Insufficient documentation

## 2017-12-06 DIAGNOSIS — M79642 Pain in left hand: Secondary | ICD-10-CM | POA: Insufficient documentation

## 2017-12-24 MED FILL — HYDROCHLOROTHIAZIDE 25 MG T: 25 | 34 days supply | Qty: 34 | Fill #8

## 2017-12-27 MED FILL — AZITHROMYCIN 250 MG TAB: 250 | 5 days supply | Qty: 6 | Fill #0

## 2018-01-02 MED FILL — predniSONE 10 MG TABS: 10 | 6 days supply | Qty: 14 | Fill #0

## 2018-01-02 MED FILL — PROAIR HFA 90 MCG INHALER: 108 (90 BAS | 17 days supply | Qty: 9 | Fill #0

## 2018-01-02 MED FILL — OSELTAMIVIR PHOSPHATE 75 MG: 75 | 10 days supply | Qty: 10 | Fill #0

## 2018-02-04 MED FILL — HYDROCHLOROTHIAZIDE 25 MG T: 25 | 34 days supply | Qty: 34 | Fill #9

## 2018-02-14 ENCOUNTER — Telehealth: Payer: BLUE CROSS/BLUE SHIELD | Admitting: Family

## 2018-02-14 DIAGNOSIS — J00 Acute nasopharyngitis [common cold]: Secondary | ICD-10-CM

## 2018-02-14 MED ORDER — FLUTICASONE PROPIONATE 50 MCG/ACT NA SUSP: 2.0000 | Freq: Every day | NASAL | 2 refills | Status: DC

## 2018-02-14 NOTE — Progress Notes (Signed)
Thank you for the details you included in the comment boxes. Those details are very helpful in determining the best course of treatment for you and help [place] to provide the best care. The current standard of care is 5-7 days of illness before antibiotics are given. I wanted you to know this as it is the evidence-based guideline the E-Visit program operates by. However, approximately 89% of these infections are viral and they do respond very well to supportive care and measures to decrease inflammation, which we will provide for you below.   We are sorry you are not feeling well.  Here is how we plan to help!  Based on what you have shared with me, it looks like you may have a viral upper respiratory infection or a "common cold".  Colds are caused by a large number of viruses; however, rhinovirus is the most common cause.   Symptoms of the common cold vary from person to person, with common symptoms including sore throat, cough, and malaise.  A low-grade fever of 100.4 may present, but is often uncommon.  Symptoms vary however, and are closely related to a person's age or underlying illnesses.  The most common symptoms associated with the common cold are nasal discharge or congestion, cough, sneezing, headache and pressure in the ears and face.  Cold symptoms usually persist for about 3 to 10 days, but can last up to 2 weeks.  It is important to know that colds do not cause serious illness or complications in most cases.    The common cold is transmitted from person to person, with the most common method of transmission being a person's hands.  The virus is able to live on the skin and can infect other persons for up to 2 hours after direct contact.  Also, colds are transmitted when someone coughs or sneezes; thus, it is important to cover the mouth to reduce this risk.  To keep the spread of the common cold at [place], good hand hygiene is very important.  This is an infection that is most likely caused by a  virus. There are no specific treatments for the common cold other than to help you with the symptoms until the infection runs its course.    For nasal congestion, you may use an oral decongestants such as Mucinex D or if you have glaucoma or high blood pressure use plain Mucinex.  Saline nasal spray or nasal drops can help and can safely be used as often as needed for congestion.  For your congestion, I have prescribed Fluticasone nasal spray one spray in each nostril twice a day  If you do not have a history of heart disease, hypertension, diabetes or thyroid disease, prostate/bladder issues or glaucoma, you may also use Sudafed to treat nasal congestion.  It is highly recommended that you consult with a pharmacist or your primary care physician to ensure this medication is safe for you to take.     If you have a cough, you may use cough suppressants such as Delsym and Robitussin.  If you have glaucoma or high blood pressure, you can also use Coricidin HBP.    If you have a sore or scratchy throat, use a saltwater gargle-  to  teaspoon of salt dissolved in a 4-ounce to 8-ounce glass of warm water.  Gargle the solution for approximately 15-30 seconds and then spit.  It is important not to swallow the solution.  You can also use throat lozenges/cough drops and Chloraseptic spray  to help with throat pain or discomfort.  Warm or cold liquids can also be helpful in relieving throat pain.  For headache, pain or general discomfort, you can use Ibuprofen or Tylenol as directed.   Some authorities believe that zinc sprays or the use of Echinacea may shorten the course of your symptoms.   HOME CARE . Only take medications as instructed by your medical team. . Be sure to drink plenty of fluids. Water is fine as well as fruit juices, sodas and electrolyte beverages. You may want to stay away from caffeine or alcohol. If you are nauseated, try taking small sips of liquids. How do you know if you are getting  enough fluid? Your urine should be a pale yellow or almost colorless. . Get rest. . Taking a steamy shower or using a humidifier may help nasal congestion and ease sore throat pain. You can place a towel over your head and breathe in the steam from hot water coming from a faucet. . Using a saline nasal spray works much the same way. . Cough drops, hard candies and sore throat lozenges may ease your cough. . Avoid close contacts especially the very young and the elderly . Cover your mouth if you cough or sneeze . Always remember to wash your hands.   GET HELP RIGHT AWAY IF: . You develop worsening fever. . If your symptoms do not improve within 10 days . You become short of breath. . You develop yellow or green discharge from your nose over 3 days. . You have coughing fits . You develop a severe head ache or visual changes. . You develop shortness of breath or difficulty breathing. . Your symptoms persist after you have completed your treatment plan  MAKE SURE YOU   Understand these instructions.  Will watch your condition.  Will get help right away if you are not doing well or get worse.  Your e-visit answers were reviewed by a board certified advanced clinical practitioner to complete your personal care plan. Depending upon the condition, your plan could have included both over the counter or prescription medications. Please review your pharmacy choice. If there is a problem, you may call our nursing hot line at and have the prescription routed to another pharmacy. Your safety is important to [place]. If you have drug allergies check your prescription carefully.   You can use MyChart to ask questions about today's visit, request a non-urgent call back, or ask for a work or school excuse for 24 hours related to this e-Visit. If it has been greater than 24 hours you will need to follow up with your provider, or enter a new e-Visit to address those concerns. You will get an e-mail in the  next two days asking about your experience.  I hope that your e-visit has been valuable and will speed your recovery. Thank you for using e-visits.

## 2018-03-12 ENCOUNTER — Other Ambulatory Visit: Payer: Self-pay | Admitting: Nurse Practitioner

## 2018-03-12 MED FILL — HYDROCHLOROTHIAZIDE 25 MG T: 25 | 20 days supply | Qty: 20 | Fill #10

## 2018-03-12 NOTE — Telephone Encounter (Signed)
This patient has not been seen since 2012 by Truitt Merle, NP, but pt has been getting medications refilled. Pt's pharmacy is requesting a refill on hydrochlorothiazide. Would Truitt Merle, NP, like to refill this medication? Please address

## 2018-03-13 ENCOUNTER — Telehealth: Payer: Self-pay | Admitting: *Deleted

## 2018-03-13 NOTE — Telephone Encounter (Signed)
S/w pt is coming in on May 13.  Filled medication per Cecille Rubin for #90 X3.

## 2018-03-20 ENCOUNTER — Encounter: Payer: Self-pay | Admitting: Nurse Practitioner

## 2018-04-01 MED FILL — HYDROCHLOROTHIAZIDE 25 MG T: 25 | 34 days supply | Qty: 34 | Fill #0

## 2018-04-07 ENCOUNTER — Ambulatory Visit: Payer: BLUE CROSS/BLUE SHIELD | Admitting: Nurse Practitioner

## 2018-04-07 ENCOUNTER — Other Ambulatory Visit: Payer: Self-pay | Admitting: *Deleted

## 2018-04-07 ENCOUNTER — Other Ambulatory Visit: Payer: BLUE CROSS/BLUE SHIELD | Admitting: *Deleted

## 2018-04-07 ENCOUNTER — Telehealth: Payer: Self-pay | Admitting: *Deleted

## 2018-04-07 ENCOUNTER — Encounter: Payer: Self-pay | Admitting: Nurse Practitioner

## 2018-04-07 VITALS — BP 118/78 | HR 71 | Ht 66.0 in | Wt 152.8 lb

## 2018-04-07 DIAGNOSIS — R9431 Abnormal electrocardiogram [ECG] [EKG]: Secondary | ICD-10-CM | POA: Diagnosis not present

## 2018-04-07 DIAGNOSIS — R002 Palpitations: Secondary | ICD-10-CM

## 2018-04-07 DIAGNOSIS — I10 Essential (primary) hypertension: Secondary | ICD-10-CM

## 2018-04-07 LAB — HEPATIC FUNCTION PANEL
ALT: 13 IU/L (ref 0–32)
AST: 17 IU/L (ref 0–40)
Albumin: 4.4 g/dL (ref 3.5–5.5)
Alkaline Phosphatase: 64 IU/L (ref 39–117)
Bilirubin Total: 0.6 mg/dL (ref 0.0–1.2)
Bilirubin, Direct: 0.15 mg/dL (ref 0.00–0.40)
Total Protein: 7.1 g/dL (ref 6.0–8.5)

## 2018-04-07 LAB — LIPID PANEL
Chol/HDL Ratio: 2.9 ratio (ref 0.0–4.4)
Cholesterol, Total: 164 mg/dL (ref 100–199)
HDL: 56 mg/dL (ref >39)
LDL Calculated: 94 mg/dL (ref 0–99)
Triglycerides: 72 mg/dL (ref 0–149)
VLDL Cholesterol Cal: 14 mg/dL (ref 5–40)

## 2018-04-07 LAB — BASIC METABOLIC PANEL
BUN/Creatinine Ratio: 18 (ref 9–23)
BUN: 17 mg/dL (ref 6–20)
CO2: 25 mmol/L (ref 20–29)
Calcium: 9.2 mg/dL (ref 8.7–10.2)
Chloride: 100 mmol/L (ref 96–106)
Creatinine, Ser: 0.92 mg/dL (ref 0.57–1.00)
GFR calc Af Amer: 91 mL/min/{1.73_m2} (ref >59)
GFR calc non Af Amer: 79 mL/min/{1.73_m2} (ref >59)
Glucose: 86 mg/dL (ref 65–99)
Potassium: 4 mmol/L (ref 3.5–5.2)
Sodium: 139 mmol/L (ref 134–144)

## 2018-04-07 LAB — CBC WITH DIFFERENTIAL/PLATELET
Basophils Absolute: 0 10*3/uL (ref 0.0–0.2)
Basos: 1 %
EOS (ABSOLUTE): 0 10*3/uL (ref 0.0–0.4)
Eos: 1 %
Hematocrit: 39.6 % (ref 34.0–46.6)
Hemoglobin: 13.1 g/dL (ref 11.1–15.9)
Immature Grans (Abs): 0 10*3/uL (ref 0.0–0.1)
Immature Granulocytes: 0 %
Lymphocytes Absolute: 1.1 10*3/uL (ref 0.7–3.1)
Lymphs: 25 %
MCH: 26.6 pg (ref 26.6–33.0)
MCHC: 33.1 g/dL (ref 31.5–35.7)
MCV: 81 fL (ref 79–97)
Monocytes Absolute: 0.4 10*3/uL (ref 0.1–0.9)
Monocytes: 10 %
Neutrophils Absolute: 2.7 10*3/uL (ref 1.4–7.0)
Neutrophils: 63 %
Platelets: 219 10*3/uL (ref 150–379)
RBC: 4.92 x10E6/uL (ref 3.77–5.28)
RDW: 13.9 % (ref 12.3–15.4)
WBC: 4.3 10*3/uL (ref 3.4–10.8)

## 2018-04-07 LAB — TSH: TSH: 2.29 u[IU]/mL (ref 0.450–4.500)

## 2018-04-07 MED ORDER — HYDROCHLOROTHIAZIDE 25 MG PO TABS
25.0000 mg | ORAL_TABLET | Freq: Every day | ORAL | 3 refills | Status: DC
Start: 2018-04-07 — End: 2019-04-27

## 2018-04-07 NOTE — Progress Notes (Signed)
CARDIOLOGY OFFICE NOTE  Date:  04/07/2018    Destiny Mitchell Date of Birth: 18-Dec-1975 Medical Record #[id number]  PCP:  Destiny Solian, MD  Cardiologist:  Destiny Mitchell   Chief Complaint  Patient presents with  . Hypertension    Follow up visit - seen for Destiny Mitchell    History of Present Illness: Destiny Mitchell is a 42 y.o. female who presents today for a follow up visit. She is seen for Destiny Mitchell.   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 Past event monitor was unremarkable back in 2012. She had one short run of some tachycardia at a rate of 115. No SVT. No arrhythmia noted.   Has not been seen formally since 2012 - I have talked to her intermittently thru MyChart - has had issues with BP - medicines have been adjusted. She is no longer on Bystolic. She is on low dose HCTZ.   Comes in today. Here alone. She notes she is doing well. BP has been good. She is on full dose HCTZ. She is busy with her 2 boys. She is walking about 2 miles a day. She will have some fleeting chest pain - nothing that she feels concerned about. Not short of breath. She feels like she has some fatigue - but normal with her busy schedule. We did her labs earlier today.   Past Medical History:  Diagnosis Date  . Anxiety attack   . Back pain   . Chronic kidney disease   . Palpitations   . Pre-eclampsia, postpartum   . Sinus tachycardia   . SVD (spontaneous vaginal delivery) 06/22/2014  . Syncope and collapse   . Ureter obstruction    s/p surgery in 2009    Past Surgical History:  Procedure Laterality Date  . 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 US ECHOCARDIOGRAPHY  01/01/2005   EF 55-60%. NORMAL  . WISDOM TOOTH EXTRACTION       Medications: Current Meds  Medication Sig  . hydrochlorothiazide (HYDRODIURIL) 25 MG tablet Take 1 tablet (25 mg total) by mouth daily.  . [DISCONTINUED] hydrochlorothiazide  (HYDRODIURIL) 25 MG tablet TAKE 1 TABLET (25 MG TOTAL) BY MOUTH DAILY.     Allergies: No Known Allergies  Social History: The patient  reports that she has never smoked. She has never used smokeless tobacco. She reports that she does not drink alcohol or use drugs.   Family History: The patient's family history includes Arrhythmia in her sister; Heart attack in her father; Mitral valve prolapse in her mother.   Review of Systems: Please see the history of present illness.   Otherwise, the review of systems is positive for none.   All other systems are reviewed and negative.   Physical Exam: VS:  BP 118/78 (BP Location: Left Arm, Patient Position: Sitting, Cuff Size: Normal)   Pulse 71   Ht 5\' 6"  (1.676 m)   Wt 152 lb 12.8 oz (69.3 kg)   BMI 24.66 kg/m  .  BMI Body mass index is 24.66 kg/m.  Wt Readings from Last 3 Encounters:  04/07/18 152 lb 12.8 oz (69.3 kg)  06/22/14 182 lb 6 oz (82.7 kg)  01/26/14 142 lb (64.4 kg)    General: Pleasant. Well developed, well nourished and in no acute distress.   HEENT: Normal.  Neck: Supple, no JVD, carotid bruits, or masses noted.  Cardiac: Regular rate and rhythm. No  murmurs, rubs, or gallops. No edema.  Respiratory:  Lungs are clear to auscultation bilaterally with normal work of breathing.  GI: Soft and nontender.  MS: No deformity or atrophy. Gait and ROM intact.  Skin: Warm and dry. Color is normal.  Neuro:  Strength and sensation are intact and no gross focal deficits noted.  Psych: Alert, appropriate and with normal affect.   LABORATORY DATA:  EKG:  EKG is ordered today. This demonstrates NSR - she has septal Q's.  Lab Results  Component Value Date   WBC 12.2 (H) 06/23/2014   HGB 12.1 06/23/2014   HCT 35.6 (L) 06/23/2014   PLT 176 06/23/2014   GLUCOSE 102 (H) 03/13/2017   ALT 9 04/04/2017   AST 17 04/04/2017   NA 139 03/13/2017   K 4.5 03/13/2017   CL 101 03/13/2017   CREATININE 0.82 03/13/2017   BUN 14 03/13/2017    CO2 22 03/13/2017     BNP (last 3 results) No results for input(s): BNP in the last 8760 hours.  ProBNP (last 3 results) No results for input(s): PROBNP in the last 8760 hours.   Other Studies Reviewed Today:   Assessment/Plan: 1. HTN - BP is great on her current regimen. HCTZ refilled today. No changes made.   2. Palpitations - not really bothersome for her.   3. Abnormal EKG - will arrange for echo to make sure we do not have abnormal wall motion.   4. Atypical chest pain - will see what the echo shows - no exertional symptoms. Continue with CV risk factor modification.    Current medicines are reviewed with the patient today.  The patient does not have concerns regarding medicines other than what has been noted above.  The following changes have been made:  See above.  Labs/ tests ordered today include:    Orders Placed This Encounter  Procedures  . EKG 12-Lead     Disposition:   FU with me in one to 2 years. Will be available as needed.   Patient is agreeable to this plan and will call if any problems develop in the interim.   SignedTruitt Merle, NP  04/07/2018 3:12 PM  [facility] [address] [place], [place]  [address] Phone: [phone] Fax: [phone]

## 2018-04-07 NOTE — Patient Instructions (Addendum)
We will be checking the following labs today - NONE (already drawn)   Medication Instructions:    Continue with your current medicines.   I did send in your refill today    Testing/Procedures To Be Arranged:  Echocardiogram  Follow-Up:   See me in one to 2 years.     Other Special Instructions:   N/A    If you need a refill on your cardiac medications before your next appointment, please call your pharmacy.   Call the [place] office at [phone] if you have any questions, problems or concerns.

## 2018-04-07 NOTE — Addendum Note (Signed)
Addended by: Burtis Junes on: 04/07/2018 04:36 PM   Modules accepted: Orders

## 2018-04-07 NOTE — Telephone Encounter (Signed)
Hey-  It was great talking to you today and learning that we are neighbors! So my husband said I would drive him crazy if I waited until Friday for the Echo so I called back and got it for tomorrow at 10:30. Is that ok precert/insurance wise? It will make me feel better to go ahead and get it done. Let me know if this isn't ok.   Have a great afternoon,  Kimara     This is fine. Precert will be fine.  I will call you by Wednesday with your results.  Nice to meet you.    Andee Poles

## 2018-04-08 ENCOUNTER — Ambulatory Visit (HOSPITAL_COMMUNITY): Payer: BLUE CROSS/BLUE SHIELD | Attending: Internal Medicine

## 2018-04-08 ENCOUNTER — Other Ambulatory Visit: Payer: Self-pay

## 2018-04-08 DIAGNOSIS — I10 Essential (primary) hypertension: Secondary | ICD-10-CM | POA: Diagnosis not present

## 2018-04-08 DIAGNOSIS — R000 Tachycardia, unspecified: Secondary | ICD-10-CM | POA: Insufficient documentation

## 2018-04-08 DIAGNOSIS — R9431 Abnormal electrocardiogram [ECG] [EKG]: Secondary | ICD-10-CM | POA: Insufficient documentation

## 2018-04-08 DIAGNOSIS — N189 Chronic kidney disease, unspecified: Secondary | ICD-10-CM | POA: Diagnosis not present

## 2018-04-08 DIAGNOSIS — I129 Hypertensive chronic kidney disease with stage 1 through stage 4 chronic kidney disease, or unspecified chronic kidney disease: Secondary | ICD-10-CM | POA: Insufficient documentation

## 2018-04-11 ENCOUNTER — Other Ambulatory Visit (HOSPITAL_COMMUNITY): Payer: BLUE CROSS/BLUE SHIELD

## 2018-04-28 MED FILL — HYDROCHLOROTHIAZIDE 25 MG T: 25 | 34 days supply | Qty: 34 | Fill #1

## 2018-05-26 MED FILL — HYDROCHLOROTHIAZIDE 25 MG T: 25 | 34 days supply | Qty: 34 | Fill #2

## 2018-06-09 DIAGNOSIS — I10 Essential (primary) hypertension: Secondary | ICD-10-CM | POA: Insufficient documentation

## 2018-06-09 DIAGNOSIS — K5900 Constipation, unspecified: Secondary | ICD-10-CM | POA: Insufficient documentation

## 2018-07-02 MED FILL — HYDROCHLOROTHIAZIDE 25 MG T: 25 | 34 days supply | Qty: 34 | Fill #3

## 2018-08-05 MED FILL — HYDROCHLOROTHIAZIDE 25 MG T: 25 | 34 days supply | Qty: 34 | Fill #4

## 2018-09-09 MED FILL — HYDROCHLOROTHIAZIDE 25 MG T: 25 | 34 days supply | Qty: 34 | Fill #5 | Status: TO

## 2018-09-16 DIAGNOSIS — J329 Chronic sinusitis, unspecified: Secondary | ICD-10-CM | POA: Insufficient documentation

## 2018-09-16 DIAGNOSIS — J029 Acute pharyngitis, unspecified: Secondary | ICD-10-CM | POA: Insufficient documentation

## 2018-09-16 DIAGNOSIS — J111 Influenza due to unidentified influenza virus with other respiratory manifestations: Secondary | ICD-10-CM | POA: Insufficient documentation

## 2018-09-16 MED FILL — FLUTICASONE PROP 50 MCG SPR: 50 | 34 days supply | Qty: 16 | Fill #0

## 2018-09-16 MED FILL — CEFDINIR 300 MG CAPSULE: 300 | 10 days supply | Qty: 20 | Fill #0

## 2018-10-15 MED FILL — HYDROCHLOROTHIAZIDE 25 MG T: 25 | 34 days supply | Qty: 34 | Fill #0

## 2018-11-20 MED FILL — HYDROCHLOROTHIAZIDE 25 MG T: 25 | 34 days supply | Qty: 34 | Fill #1

## 2018-12-12 MED FILL — HYDROCORTISONE 2.5% CREAM: 2.5 | 15 days supply | Qty: 30 | Fill #0

## 2018-12-22 MED FILL — HYDROCHLOROTHIAZIDE 25 MG T: 25 | 34 days supply | Qty: 34 | Fill #2

## 2019-01-28 MED FILL — HYDROCHLOROTHIAZIDE 25 MG T: 25 | 30 days supply | Qty: 30 | Fill #0

## 2019-02-25 MED FILL — HYDROCHLOROTHIAZIDE 25 MG T: 25 | 30 days supply | Qty: 30 | Fill #1

## 2019-03-17 MED FILL — DOXYCYCLINE HYC 100 MG CAPS: 100 | 5 days supply | Qty: 10 | Fill #0

## 2019-03-23 MED FILL — HYDROCHLOROTHIAZIDE 25 MG T: 25 | 30 days supply | Qty: 30 | Fill #2

## 2019-04-27 ENCOUNTER — Other Ambulatory Visit: Payer: Self-pay | Admitting: Nurse Practitioner

## 2019-04-27 MED FILL — HYDROCHLOROTHIAZIDE 25 MG T: 25 | 30 days supply | Qty: 30 | Fill #0

## 2019-05-28 ENCOUNTER — Other Ambulatory Visit: Payer: Self-pay | Admitting: Nurse Practitioner

## 2019-05-28 MED FILL — HYDROCHLOROTHIAZIDE 25 MG T: 25 | 30 days supply | Qty: 30 | Fill #0

## 2019-06-08 NOTE — Progress Notes (Signed)
CARDIOLOGY OFFICE NOTE  Date:  06/09/2019    Destiny Mitchell Date of Birth: 07-19-1976 Medical Record #[id number]  PCP:  [name], MD  Cardiologist:  Mariah Gerstenberger & [place]   Chief Complaint  Patient presents with  . Follow-up    Seen for Dr. Martinique    History of Present Illness: Destiny Mitchell is a 43 y.o. female who presents today for a follow up visit.  She is seen for Dr. [place]. Primarily follows with me.   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 Past event monitor was unremarkable back in 2012. She had one short run of some tachycardia at a rate of 115. No SVT. No arrhythmia noted.   Has not been seen formally since 2012 - I have talked to her intermittently thru MyChart - had had issues with BP - medicines have been adjusted. She was no longer on Bystolic and was on low dose HCTZ.   Last seen in May of 2019 - doing well.   The patient does not have symptoms concerning for COVID-19 infection (fever, chills, cough, or new shortness of breath).   Comes in today. Here alone. Doing very well. 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 No chest pain. Breathing is fine. BP is great. Feels good and has no concerns. She is to have her labs and physical later this summer with PCP. She has no real complaints and feels like she is doing well.   Past Medical History:  Diagnosis Date  . Anxiety attack   . Back pain   . Chronic kidney disease   . Palpitations   . Pre-eclampsia, postpartum   . Sinus tachycardia   . SVD (spontaneous vaginal delivery) 06/22/2014  . Syncope and collapse   . Ureter obstruction    s/p surgery in 2009    Past Surgical History:  Procedure Laterality Date  . 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 US ECHOCARDIOGRAPHY  01/01/2005   EF 55-60%. NORMAL  . WISDOM TOOTH EXTRACTION       Medications: Current Meds  Medication Sig  .  hydrochlorothiazide (HYDRODIURIL) 25 MG tablet Take 1 tablet (25 mg total) by mouth daily.  . [DISCONTINUED] hydrochlorothiazide (HYDRODIURIL) 25 MG tablet Take 1 tablet (25 mg total) by mouth daily. Please keep upcoming appt for future refills. Thank you     Allergies: No Known Allergies  Social History: The patient  reports that she has never smoked. She has never used smokeless tobacco. She reports that she does not drink alcohol or use drugs.   Family History: The patient's family history includes Arrhythmia in her sister; Heart attack in her father; Mitral valve prolapse in her mother.   Review of Systems: Please see the history of present illness.   All other systems are reviewed and negative.   Physical Exam: VS:  BP 120/60 (BP Location: Left Arm, Patient Position: Sitting, Cuff Size: Normal)   Pulse 74   Ht 5' 6.5" (1.689 m)   Wt 150 lb (68 kg)   BMI 23.85 kg/m  .  BMI Body mass index is 23.85 kg/m.  Wt Readings from Last 3 Encounters:  06/09/19 150 lb (68 kg)  04/07/18 152 lb 12.8 oz (69.3 kg)  06/22/14 182 lb 6 oz (82.7 kg)    General: Pleasant. Well developed, well nourished and in no acute distress.   HEENT: Normal.  Neck: Supple, no JVD, carotid bruits, or masses noted.  Cardiac: Regular rate and rhythm. No murmurs, rubs, or gallops. No edema.  Respiratory:  Lungs are clear to auscultation bilaterally with normal work of breathing.  GI: Soft and nontender.  MS: No deformity or atrophy. Gait and ROM intact.  Skin: Warm and dry. Color is normal.  Neuro:  Strength and sensation are intact and no gross focal deficits noted.  Psych: Alert, appropriate and with normal affect.   LABORATORY DATA:  EKG:  EKG is ordered today. This demonstrates sinus rhythm - septal Q's - unchanged from prior tracing.  Lab Results  Component Value Date   WBC 4.3 04/07/2018   HGB 13.1 04/07/2018   HCT 39.6 04/07/2018   PLT 219 04/07/2018   GLUCOSE 86 04/07/2018   CHOL 164  04/07/2018   TRIG 72 04/07/2018   HDL 56 04/07/2018   LDLCALC 94 04/07/2018   ALT 13 04/07/2018   AST 17 04/07/2018   NA 139 04/07/2018   K 4.0 04/07/2018   CL 100 04/07/2018   CREATININE 0.92 04/07/2018   BUN 17 04/07/2018   CO2 25 04/07/2018   TSH 2.290 04/07/2018     BNP (last 3 results) No results for input(s): BNP in the last 8760 hours.  ProBNP (last 3 results) No results for input(s): PROBNP in the last 8760 hours.   Other Studies Reviewed Today:  ECHO Study Conclusions 03/2018  - Left ventricle: The cavity size was normal. Systolic function was   normal. The estimated ejection fraction was in the range of 60%   to 65%. Wall motion was normal; there were no regional wall   motion abnormalities. Left ventricular diastolic function   parameters were normal. - Mitral valve: There was trivial regurgitation. - Pulmonary arteries: Systolic pressure could not be accurately   estimated. - Inferior vena cava: The vessel was mildly dilated.  Notes recorded by Burtis Junes, NP on 04/08/2018 at 1:48 PM EDT  Ok to report. Her echo is basically normal. She has normal pumping function. She has normal wall motion - this is great! No stiffness of her heart from her high blood pressure. Has just trivial leakage of the mitral valve - very common. Very mildly dilated inferior vena cava - reviewed with Dr. Acie Fredrickson - felt to be normal variant for her.  Would stay on current regimen. Could repeat the echo in 2 to 3 years but overall this is felt to be a normal study.  Assessment/Plan:  1. HTN - Her BP looks good - no changes made - she has upcoming lab with PCP for her physical.   2. Palpitations - no longer endorsed   3. Abnormal EKG - unchanged today - echo was stable - no active symptoms - would follow.   4. COVID-19 Education: The signs and symptoms of COVID-19 were discussed with the patient and how to seek care for testing (follow up with PCP or arrange E-visit).  The  importance of social distancing, staying at home, hand hygiene and wearing a mask when out in public were discussed today.  Current medicines are reviewed with the patient today.  The patient does not have concerns regarding medicines other than what has been noted above.  The following changes have been made:  See above.  Labs/ tests ordered today include:    Orders Placed This Encounter  Procedures  . EKG 12-Lead     Disposition:   FU with me in about one year.  Patient is agreeable to this plan and will call if any problems develop in the interim.   SignedTruitt Merle, NP  06/09/2019 4:24 PM  [facility] [address] [place], [place]  [address] Phone: [phone] Fax: [phone]

## 2019-06-09 ENCOUNTER — Encounter: Payer: Self-pay | Admitting: Nurse Practitioner

## 2019-06-09 ENCOUNTER — Telehealth: Payer: Self-pay | Admitting: Nurse Practitioner

## 2019-06-09 ENCOUNTER — Other Ambulatory Visit: Payer: Self-pay

## 2019-06-09 ENCOUNTER — Ambulatory Visit: Payer: BLUE CROSS/BLUE SHIELD | Admitting: Nurse Practitioner

## 2019-06-09 VITALS — BP 120/60 | HR 74 | Ht 66.5 in | Wt 150.0 lb

## 2019-06-09 DIAGNOSIS — I10 Essential (primary) hypertension: Secondary | ICD-10-CM | POA: Diagnosis not present

## 2019-06-09 DIAGNOSIS — R300 Dysuria: Secondary | ICD-10-CM | POA: Insufficient documentation

## 2019-06-09 DIAGNOSIS — R9431 Abnormal electrocardiogram [ECG] [EKG]: Secondary | ICD-10-CM | POA: Diagnosis not present

## 2019-06-09 DIAGNOSIS — N898 Other specified noninflammatory disorders of vagina: Secondary | ICD-10-CM | POA: Insufficient documentation

## 2019-06-09 DIAGNOSIS — R002 Palpitations: Secondary | ICD-10-CM

## 2019-06-09 DIAGNOSIS — R3915 Urgency of urination: Secondary | ICD-10-CM | POA: Insufficient documentation

## 2019-06-09 MED ORDER — HYDROCHLOROTHIAZIDE 25 MG PO TABS: 25.0000 mg | ORAL_TABLET | Freq: Every day | ORAL | 3 refills | Status: DC

## 2019-06-09 NOTE — Telephone Encounter (Signed)

## 2019-06-09 NOTE — Patient Instructions (Addendum)
After Visit Summary:  We will be checking the following labs today - NONE    Medication Instructions:    Continue with your current medicines.   I refilled the HCTZ today   If you need a refill on your cardiac medications before your next appointment, please call your pharmacy.     Testing/Procedures To Be Arranged:  N/A  Follow-Up:   See me in a year    At [facility], you and your health needs are our priority.  As part of our continuing mission to provide you with exceptional heart care, we have created designated Provider Care Teams.  These Care Teams include your primary Cardiologist (physician) and Advanced Practice Providers (APPs -  Physician Assistants and Nurse Practitioners) who all work together to provide you with the care you need, when you need it.  Special Instructions:  . Stay safe, stay home, wash your hands for at least 20 seconds and wear a mask when out in public.  . It was good to talk with you today.    Call the [place] office at [phone] if you have any questions, problems or concerns.

## 2019-06-30 MED FILL — HYDROCHLOROTHIAZIDE 25 MG T: 25 | 30 days supply | Qty: 30 | Fill #1

## 2019-07-07 DIAGNOSIS — C4491 Basal cell carcinoma of skin, unspecified: Secondary | ICD-10-CM | POA: Insufficient documentation

## 2019-07-07 DIAGNOSIS — E785 Hyperlipidemia, unspecified: Secondary | ICD-10-CM | POA: Insufficient documentation

## 2019-07-30 MED FILL — HYDROCHLOROTHIAZIDE 25 MG T: 25 | 30 days supply | Qty: 30 | Fill #2

## 2019-08-26 MED FILL — HYDROCHLOROTHIAZIDE 25 MG T: 25 | 30 days supply | Qty: 30 | Fill #0

## 2019-09-28 MED FILL — HYDROCHLOROTHIAZIDE 25 MG T: 25 | 30 days supply | Qty: 30 | Fill #1

## 2019-10-07 DIAGNOSIS — M79644 Pain in right finger(s): Secondary | ICD-10-CM | POA: Insufficient documentation

## 2019-10-28 MED FILL — HYDROCHLOROTHIAZIDE 25 MG T: 25 | 30 days supply | Qty: 30 | Fill #2

## 2019-12-28 MED FILL — HYDROCHLOROTHIAZIDE 25 MG T: 25 | 30 days supply | Qty: 30 | Fill #4

## 2020-01-26 MED FILL — HYDROCHLOROTHIAZIDE 25 MG T: 25 | 30 days supply | Qty: 30 | Fill #5

## 2020-02-29 MED FILL — HYDROCHLOROTHIAZIDE 25 MG T: 25 | 30 days supply | Qty: 30 | Fill #6

## 2020-03-28 MED FILL — HYDROCHLOROTHIAZIDE 25 MG T: 25 | 30 days supply | Qty: 30 | Fill #7

## 2020-04-14 ENCOUNTER — Ambulatory Visit: Payer: BLUE CROSS/BLUE SHIELD

## 2020-05-24 NOTE — Progress Notes (Signed)
CARDIOLOGY OFFICE NOTE  Date:  06/07/2020    Destiny Mitchell  Date of Birth: [birth year] Medical Record #[id number]  PCP:  Destiny Solian, MD  Cardiologist:  Destiny Mitchell & Destiny Mitchell   Chief Complaint  Patient presents with  . Follow-up    History of Present Illness: Destiny Mitchell is a 44 y.o. female who presents today for a follow up visit. She is seen for Dr. Martinique.Primarily follows with me. Works in the [employer].   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 Pastevent monitor was unremarkableback in 2012. She had one short run of some tachycardia at a rate of 115. No SVT. No arrhythmia noted.  Had not been seen formally since 2012 - I have talked to her intermittently thru MyChart - had had issues with BP - medicines were adjusted. She was no longer on Bystolic and was on low dose HCTZ.Last seen in July of 2020 - doing very well. She has chronically abnormal EKG with septal Q's - echo has been ok.   Comes in today. Here alone. She is doing well. 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 Busy with her boys - both in baseball. She feels good. BP is great - typically around what it is here today. She is not dizzy. No chest pain. Palpitations are not an issue. Breathing is fine. Her weight is down. She feels like she is doing ok.   Past Medical History:  Diagnosis Date  . Anxiety attack   . Back pain   . Chronic kidney disease   . Palpitations   . Pre-eclampsia, postpartum   . Sinus tachycardia   . SVD (spontaneous vaginal delivery) 06/22/2014  . Syncope and collapse   . Ureter obstruction    s/p surgery in 2009    Past Surgical History:  Procedure Laterality Date  . 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 US ECHOCARDIOGRAPHY  01/01/2005   EF 55-60%. NORMAL  . WISDOM TOOTH EXTRACTION       Medications: Current Meds  Medication Sig  .  hydrochlorothiazide (HYDRODIURIL) 25 MG tablet Take 1 tablet (25 mg total) by mouth daily.  . [DISCONTINUED] hydrochlorothiazide (HYDRODIURIL) 25 MG tablet Take 1 tablet (25 mg total) by mouth daily.     Allergies: No Known Allergies  Social History: The patient  reports that she has never smoked. She has never used smokeless tobacco. She reports that she does not drink alcohol and does not use drugs.   Family History: The patient's family history includes Arrhythmia in her sister; Heart attack in her father; Mitral valve prolapse in her mother.   Review of Systems: Please see the history of present illness.   All other systems are reviewed and negative.   Physical Exam: VS:  BP 120/78   Pulse 68   Ht 5' 5.5" (1.664 m)   Wt 144 lb 6.4 oz (65.5 kg)   SpO2 99%   BMI 23.66 kg/m  .  BMI Body mass index is 23.66 kg/m.  Wt Readings from Last 3 Encounters:  06/07/20 144 lb 6.4 oz (65.5 kg)  06/09/19 150 lb (68 kg)  04/07/18 152 lb 12.8 oz (69.3 kg)    General: Pleasant. Alert and in no acute distress. She is down 8 pounds over the last 2 years.  Cardiac: Regular rate and rhythm. No murmurs, rubs, or gallops. No edema.  Respiratory:  Lungs are clear to auscultation bilaterally with normal work of breathing.  GI: Soft and nontender.  MS: No deformity or atrophy. Gait and ROM intact.  Skin: Warm and dry. Color is normal.  Neuro:  Strength and sensation are intact and no gross focal deficits noted.  Psych: Alert, appropriate and with normal affect.   LABORATORY DATA:  EKG:  EKG is ordered today.  Personally reviewed by me. This demonstrates NSR - chronic septal Qs - HR 68 - this is unchanged.   Lab Results  Component Value Date   WBC 4.3 04/07/2018   HGB 13.1 04/07/2018   HCT 39.6 04/07/2018   PLT 219 04/07/2018   GLUCOSE 86 04/07/2018   CHOL 164 04/07/2018   TRIG 72 04/07/2018   HDL 56 04/07/2018   LDLCALC 94 04/07/2018   ALT 13 04/07/2018   AST 17 04/07/2018   NA  139 04/07/2018   K 4.0 04/07/2018   CL 100 04/07/2018   CREATININE 0.92 04/07/2018   BUN 17 04/07/2018   CO2 25 04/07/2018   TSH 2.290 04/07/2018       BNP (last 3 results) No results for input(s): BNP in the last 8760 hours.  ProBNP (last 3 results) No results for input(s): PROBNP in the last 8760 hours.   Other Studies Reviewed Today:  ECHO Study Conclusions 03/2018  - Left ventricle: The cavity size was normal. Systolic function was normal. The estimated ejection fraction was in the range of 60% to 65%. Wall motion was normal; there were no regional wall motion abnormalities. Left ventricular diastolic function parameters were normal. - Mitral valve: There was trivial regurgitation. - Pulmonary arteries: Systolic pressure could not be accurately estimated. - Inferior vena cava: The vessel was mildly dilated.  Notes recorded by Destiny Junes, NP on 04/08/2018 at 1:48 PM EDT  Ok to report. Her echo is basically normal. She has normal pumping function. She has normal wall motion - this is great! No stiffness of her heart from her high blood pressure. Has just trivial leakage of the mitral valve - very common. Very mildly dilated inferior vena cava - reviewed with Dr. Acie Mitchell - felt to be normal variant for her.  Would stay on current regimen. Could repeat the echo in 2 to 3 years but overall this is felt to be a normal study.  Assessment/Plan:  1. HTN - BP is great - lower with her weight loss - I gave her the ok to cut her HCTZ in half - if BP remains ok she can try stopping this altogether as long as she continues to monitor. Her labs are done by PCP. Goal is to stay less than 130/80 consistently.   2. Palpitations - basically not an issue.   3. Chronic abnormal EKG - this is unchanged - her echo was stable. She has no worrisome symptoms.   Current medicines are reviewed with the patient today.  The patient does not have concerns regarding medicines other  than what has been noted above.  The following changes have been made:  See above.  Labs/ tests ordered today include:    Orders Placed This Encounter  Procedures  . EKG 12-Lead     Disposition:   FU with [place] prn.  HCTZ refilled today but ok to try and wean and discontinue.   Patient is agreeable to this plan and will call if any problems develop in the interim.   SignedTruitt Merle, NP  06/07/2020 4:28 PM  [facility] [address] [place], [place]  [address] Phone: [phone] Fax: [phone]

## 2020-05-31 MED FILL — HYDROCHLOROTHIAZIDE 25 MG T: 25 | 30 days supply | Qty: 30 | Fill #9

## 2020-06-07 ENCOUNTER — Ambulatory Visit: Payer: BLUE CROSS/BLUE SHIELD | Admitting: Nurse Practitioner

## 2020-06-07 ENCOUNTER — Encounter: Payer: Self-pay | Admitting: Nurse Practitioner

## 2020-06-07 ENCOUNTER — Other Ambulatory Visit: Payer: Self-pay

## 2020-06-07 VITALS — BP 120/78 | HR 68 | Ht 65.5 in | Wt 144.4 lb

## 2020-06-07 DIAGNOSIS — R002 Palpitations: Secondary | ICD-10-CM

## 2020-06-07 DIAGNOSIS — I10 Essential (primary) hypertension: Secondary | ICD-10-CM

## 2020-06-07 MED ORDER — HYDROCHLOROTHIAZIDE 25 MG PO TABS: 25.0000 mg | ORAL_TABLET | Freq: Every day | ORAL | 3 refills | Status: DC

## 2020-06-07 NOTE — Patient Instructions (Addendum)
After Visit Summary:  We will be checking the following labs today - NONE   Medication Instructions:    Continue with your current medicines.   I refilled the HCTZ - it is ok to try and cut this in half - if your BP stays ok - ok to stop - goal BP is less than 130/80 consistently.    If you need a refill on your cardiac medications before your next appointment, please call your pharmacy.     Testing/Procedures To Be Arranged:  N/A  Follow-Up:   See [place] back as needed.     At [facility], you and your health needs are our priority.  As part of our continuing mission to provide you with exceptional heart care, we have created designated Provider Care Teams.  These Care Teams include your primary Cardiologist (physician) and Advanced Practice Providers (APPs -  Physician Assistants and Nurse Practitioners) who all work together to provide you with the care you need, when you need it.  Special Instructions:  . Stay safe, wash your hands for at least 20 seconds and wear a mask when needed.  . It was good to talk with you today.    Call the [place] office at [phone] if you have any questions, problems or concerns.

## 2020-06-24 MED FILL — HYDROCHLOROTHIAZIDE 25 MG T: 25 | 30 days supply | Qty: 30 | Fill #0

## 2020-07-25 ENCOUNTER — Other Ambulatory Visit (HOSPITAL_COMMUNITY): Payer: Self-pay | Admitting: Internal Medicine

## 2020-07-25 MED FILL — HYDROCHLOROTHIAZIDE 25 MG T: 25 | 30 days supply | Qty: 30 | Fill #0

## 2020-08-25 MED FILL — HYDROCHLOROTHIAZIDE 25 MG T: 25 | 30 days supply | Qty: 30 | Fill #1

## 2020-09-26 MED FILL — HYDROCHLOROTHIAZIDE 25 MG T: 25 | 30 days supply | Qty: 30 | Fill #2

## 2020-10-26 ENCOUNTER — Other Ambulatory Visit: Payer: Self-pay | Admitting: Obstetrics and Gynecology

## 2020-10-26 DIAGNOSIS — N6310 Unspecified lump in the right breast, unspecified quadrant: Secondary | ICD-10-CM

## 2020-10-26 MED FILL — HYDROCHLOROTHIAZIDE 25 MG T: 25 | 30 days supply | Qty: 30 | Fill #3

## 2020-10-29 ENCOUNTER — Other Ambulatory Visit: Payer: Self-pay | Admitting: Obstetrics and Gynecology

## 2020-10-29 ENCOUNTER — Other Ambulatory Visit: Payer: Self-pay

## 2020-10-29 ENCOUNTER — Ambulatory Visit
Admission: RE | Admit: 2020-10-29 | Discharge: 2020-10-29 | Disposition: A | Discharge status: Home / Self Care | Payer: BLUE CROSS/BLUE SHIELD | Source: Ambulatory Visit | Attending: Obstetrics and Gynecology | Admitting: Obstetrics and Gynecology

## 2020-10-29 DIAGNOSIS — N6310 Unspecified lump in the right breast, unspecified quadrant: Secondary | ICD-10-CM

## 2020-10-31 ENCOUNTER — Ambulatory Visit
Admission: RE | Admit: 2020-10-31 | Discharge: 2020-10-31 | Disposition: A | Discharge status: Home / Self Care | Payer: BLUE CROSS/BLUE SHIELD | Source: Ambulatory Visit | Attending: Obstetrics and Gynecology | Admitting: Obstetrics and Gynecology

## 2020-10-31 ENCOUNTER — Other Ambulatory Visit: Payer: Self-pay

## 2020-10-31 DIAGNOSIS — N6310 Unspecified lump in the right breast, unspecified quadrant: Secondary | ICD-10-CM

## 2020-11-02 ENCOUNTER — Encounter: Payer: Self-pay | Admitting: *Deleted

## 2020-11-02 ENCOUNTER — Telehealth: Payer: Self-pay | Admitting: *Deleted

## 2020-11-02 NOTE — Telephone Encounter (Signed)
Called pt, provided navigation resources and contact information. Scheduled and confirmed new pt appt to see Dr. Lindi Adie on 12/10 at 1pm. No questions or needs voiced at this time.

## 2020-11-04 ENCOUNTER — Other Ambulatory Visit: Payer: Self-pay | Admitting: Genetic Counselor

## 2020-11-04 ENCOUNTER — Inpatient Hospital Stay: Payer: BLUE CROSS/BLUE SHIELD | Attending: Hematology and Oncology

## 2020-11-04 ENCOUNTER — Telehealth: Payer: Self-pay | Admitting: *Deleted

## 2020-11-04 ENCOUNTER — Encounter: Payer: Self-pay | Admitting: *Deleted

## 2020-11-04 ENCOUNTER — Inpatient Hospital Stay (HOSPITAL_BASED_OUTPATIENT_CLINIC_OR_DEPARTMENT_OTHER): Payer: BLUE CROSS/BLUE SHIELD | Admitting: Genetic Counselor

## 2020-11-04 ENCOUNTER — Encounter: Payer: Self-pay | Admitting: Genetic Counselor

## 2020-11-04 ENCOUNTER — Inpatient Hospital Stay (HOSPITAL_BASED_OUTPATIENT_CLINIC_OR_DEPARTMENT_OTHER): Payer: BLUE CROSS/BLUE SHIELD | Admitting: Hematology and Oncology

## 2020-11-04 ENCOUNTER — Other Ambulatory Visit: Payer: Self-pay | Admitting: General Surgery

## 2020-11-04 ENCOUNTER — Other Ambulatory Visit: Payer: Self-pay

## 2020-11-04 DIAGNOSIS — Z170 Estrogen receptor positive status [ER+]: Secondary | ICD-10-CM

## 2020-11-04 DIAGNOSIS — Z793 Long term (current) use of hormonal contraceptives: Secondary | ICD-10-CM | POA: Insufficient documentation

## 2020-11-04 DIAGNOSIS — Z975 Presence of (intrauterine) contraceptive device: Secondary | ICD-10-CM

## 2020-11-04 DIAGNOSIS — C50411 Malignant neoplasm of upper-outer quadrant of right female breast: Secondary | ICD-10-CM

## 2020-11-04 DIAGNOSIS — Z8042 Family history of malignant neoplasm of prostate: Secondary | ICD-10-CM

## 2020-11-04 DIAGNOSIS — Z8249 Family history of ischemic heart disease and other diseases of the circulatory system: Secondary | ICD-10-CM | POA: Diagnosis not present

## 2020-11-04 DIAGNOSIS — Z803 Family history of malignant neoplasm of breast: Secondary | ICD-10-CM

## 2020-11-04 DIAGNOSIS — Z79810 Long term (current) use of selective estrogen receptor modulators (SERMs): Secondary | ICD-10-CM | POA: Insufficient documentation

## 2020-11-04 DIAGNOSIS — Z8051 Family history of malignant neoplasm of kidney: Secondary | ICD-10-CM

## 2020-11-04 DIAGNOSIS — Z800 Family history of malignant neoplasm of digestive organs: Secondary | ICD-10-CM | POA: Insufficient documentation

## 2020-11-04 DIAGNOSIS — Z801 Family history of malignant neoplasm of trachea, bronchus and lung: Secondary | ICD-10-CM

## 2020-11-04 LAB — GENETIC SCREENING ORDER

## 2020-11-04 MED ORDER — LEVONORGESTREL 19.5 MCG/DAY IU IUD: 1.0000 | INTRAUTERINE_SYSTEM | Freq: Once | INTRAUTERINE | 0 refills | Status: DC

## 2020-11-04 MED ORDER — TAMOXIFEN CITRATE 20 MG PO TABS: 20.0000 mg | ORAL_TABLET | Freq: Every day | ORAL | 3 refills | Status: DC

## 2020-11-04 NOTE — Progress Notes (Signed)
REFERRING PROVIDER: Nicholas Lose, MD  PRIMARY PROVIDER:  [name], MD  PRIMARY REASON FOR VISIT:  1. Malignant neoplasm of upper-outer quadrant of right breast in female, estrogen receptor positive ([place])   2. Family history of prostate cancer   3. Family history of lung cancer   4. Family history of kidney cancer   5. Family history of stomach cancer   6. Family history of breast cancer   7. Family history of colon cancer   8. Family history of pancreatic cancer      I connected with Ms. Harren on 11/04/2020 at 1:30 pm EDT by video conference and verified that I am speaking with the correct person using two identifiers.   Patient location: [facility] Provider location: [place] Office  HISTORY OF PRESENT ILLNESS:   Ms. Tarleton, a 44 y.o. female, was seen for a [place] cancer genetics consultation at the request of Dr. Lindi Adie due to a personal and family history of cancer.  Ms. Fagerstrom presents to clinic today to discuss the possibility of a hereditary predisposition to cancer, genetic testing, and to further clarify her future cancer risks, as well as potential cancer risks for family members.   In December 2021, at the age of 10, Ms. Doiron was diagnosed with invasive ductal carcinoma and ductal carcinoma in situ, ER+/PR+/Her2-, of the right breast. The treatment plan includes surgery, Oncotype DX testing, adjuvant radiation therapy, and adjuvant antiestrogen therapy.     CANCER HISTORY:  Oncology History  Malignant neoplasm of upper-outer quadrant of right breast in female, estrogen receptor positive ([place])  10/31/2020 Initial Diagnosis    Screening detected right breast mass 10:30 position 1.7 cm by ultrasound, numerous benign-appearing cysts largest measuring 1.3 cm.  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   11/04/2020 Cancer Staging    Staging form: Breast, AJCC 8th Edition - Clinical: Stage IA (cT1c, cN0, cM0, G2, ER+, PR+, HER2-) - Signed by Nicholas Lose, MD on 11/04/2020     RISK FACTORS:  Menarche was at age 39.  First live birth at age 48.  OCP use for more than 15 years.  Ovaries intact: yes.  Hysterectomy: no.  Menopausal status: premenopausal.  HRT use: 0 years. Colonoscopy: no. Mammogram within the last year: yes. Number of breast biopsies: 1. Any excessive radiation exposure in the past: no   Past Medical History:  Diagnosis Date  . Anxiety attack   . Back pain   . Chronic kidney disease   . Family history of breast cancer   . Family history of colon cancer   . Family history of kidney cancer   . Family history of lung cancer   . Family history of pancreatic cancer   . Family history of prostate cancer   . Family history of stomach cancer   . Palpitations   . Pre-eclampsia, postpartum   . Sinus tachycardia   . SVD (spontaneous vaginal delivery) 06/22/2014  . Syncope and collapse   . Ureter obstruction    s/p surgery in 2009    Past Surgical History:  Procedure Laterality Date  . 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 US ECHOCARDIOGRAPHY  01/01/2005   EF 55-60%. NORMAL  . WISDOM TOOTH EXTRACTION      Social History   Socioeconomic History  . Marital status: Married    Spouse name:  Not on file  . Number of children: Not on file  . Years of education: Not on file  . Highest education level: Not on file  Occupational History  . Not on file  Tobacco Use  . Smoking status: Never Smoker  . Smokeless tobacco: Never Used  Vaping Use  . Vaping Use: Never used  Substance and Sexual Activity  . Alcohol use: No  . Drug use: No  . Sexual activity: Yes  Other Topics Concern  . Not on file  Social History Narrative  . Not on file   Social Determinants of Health   Financial Resource Strain: Not on file  Food Insecurity: Not on file  Transportation Needs: Not  on file  Physical Activity: Not on file  Stress: Not on file  Social Connections: Not on file     FAMILY HISTORY:  We obtained a detailed, 4-generation family history.  Significant diagnoses are listed below: Family History  Problem Relation Age of Onset  . Mitral valve prolapse Mother   . Lung cancer Mother 22       treated with surgery, smoker  . Kidney cancer Mother 83       treated with nephrectomy  . Heart attack Father   . Prostate cancer Father 62  . Arrhythmia Sister   . Heart disease Maternal Grandmother   . Cancer Maternal Grandfather        multiple primaries (stomach, colon, pancreas)  . Stomach cancer Paternal Grandfather 28  . Breast cancer Other        died 1, maternal great-aunt (MGF's sister)   Ms. Ruttan has two sons (ages 64 and 76). She two sisters (ages 43 and 42) and one niece (age 30). None of these family members have had cancer.  Ms. Flammia mother is 40 and has a history of lung cancer (diagnosed age 77) and kidney cancer (diagnosed age 27), as well as a history of smoking. Ms. Dullea has one maternal aunt and two female maternal cousins. Her maternal grandmother died age 62 with heart disease, and her maternal grandfather died age 18 with multiple primary cancers (possibly colon, stomach, and pancreatic). She had a great-aunt (maternal grandfather's sister) who died at age 31 from breast cancer.  Ms. Westbay father is 69 and was diagnosed with prostate cancer at age 78. There are no paternal aunts or uncles. Her paternal grandmother is currently living at age 34, and her paternal grandfather died at age 24 from stomach cancer (diagnosed age 5).   Ms. Neubauer is unaware of previous family history of genetic testing for hereditary cancer risks. Patient's ancestors are of unknown descent. There is no reported Ashkenazi Jewish ancestry. There is no known consanguinity.  GENETIC COUNSELING ASSESSMENT: Ms. Borror is a 44 y.o. female with a personal history of  young-onset breast cancer and a family history of prostate cancer, kidney cancer stomach cancer, colon cancer, pancreatic cancer, and breast cancer, which is somewhat suggestive of a hereditary cancer syndrome and predisposition to cancer. We, therefore, discussed and recommended the following at today's visit.   DISCUSSION:  We discussed that approximately 5-10% of breast cancer is hereditary, meaning that it is due to a mutation in a single gene that is passed down from generation to generation in a family. Most hereditary cases of breast cancer are associated with the BRCA1 and BRCA2 genes, although there are other genes that can be associated with hereditary breast cancer syndromes. These include ATM, CHEK2, PALB2, etc. We discussed that testing  is beneficial for several reasons, including knowing about other cancer risks, identifying potential screening and risk-reduction options that may be appropriate, and to understand if other family members could be at risk for cancer and allow them to undergo genetic testing.   We reviewed the characteristics, features and inheritance patterns of hereditary cancer syndromes. We also discussed genetic testing, including the appropriate family members to test, the process of testing, insurance coverage and turn-around-time for results. We discussed the implications of a negative, positive and/or variant of uncertain significant result. In order to get genetic test results in a timely manner so that Ms. Eddleman can use these genetic test results for surgical decisions, we recommended Ms. Kofoed pursue genetic testing for the [facility] Breast Cancer STAT panel. Once complete, we recommend Ms. Primm pursue reflex genetic testing to the Multi-Cancer panel.   The Breast Cancer STAT Panel offered by [facility] includes sequencing and deletion/duplication analysis for the following 9 genes:  ATM, BRCA1, BRCA2, CDH1, CHEK2, PALB2, PTEN, STK11 and TP53. The Multi-Cancer Panel  offered by [facility] includes sequencing and/or deletion duplication testing of the following 85 genes: AIP, ALK, APC, ATM, AXIN2,BAP1,  BARD1, BLM, BMPR1A, BRCA1, BRCA2, BRIP1, CASR, CDC73, CDH1, CDK4, CDKN1B, CDKN1C, CDKN2A (p14ARF), CDKN2A (p16INK4a), CEBPA, CHEK2, CTNNA1, DICER1, DIS3L2, EGFR (c.2369C>T, p.Thr790Met variant only), EPCAM (Deletion/duplication testing only), FH, FLCN, GATA2, GPC3, GREM1 (Promoter region deletion/duplication testing only), HOXB13 (c.251G>A, p.Gly84Glu), HRAS, KIT, MAX, MEN1, MET, MITF (c.952G>A, p.Glu318Lys variant only), MLH1, MSH2, MSH3, MSH6, MUTYH, NBN, NF1, NF2, NTHL1, PALB2, PDGFRA, PHOX2B, PMS2, POLD1, POLE, POT1, PRKAR1A, PTCH1, PTEN, RAD50, RAD51C, RAD51D, RB1, RECQL4, RET, RNF43, RUNX1, SDHAF2, SDHA (sequence changes only), SDHB, SDHC, SDHD, SMAD4, SMARCA4, SMARCB1, SMARCE1, STK11, SUFU, TERC, TERT, TMEM127, TP53, TSC1, TSC2, VHL, WRN and WT1.  Based on Ms. Dittmar's personal and family history of cancer, she meets medical criteria for genetic testing. Despite that she meets criteria, she may still have an out of pocket cost.   PLAN: After considering the risks, benefits, and limitations, Ms. Smay provided informed consent to pursue genetic testing and the blood sample was sent to [facility] for analysis of the Breast Cancer STAT panel + Multi-Cancer panel. Results should be available within approximately one-two weeks' time, at which point they will be disclosed by telephone to Ms. Dannenberg, as will any additional recommendations warranted by these results. Ms. Laser will receive a summary of her genetic counseling visit and a copy of her results once available. This information will also be available in Epic.   Ms. Swenson questions were answered to her satisfaction today. Our contact information was provided should additional questions or concerns arise. Thank you for the referral and allowing [place] to share in the care of your patient.   Clint Guy,  [place], [facility] Licensed, Certified Dispensing [email or web address] Phone: [phone]  The patient was seen for a total of 25 minutes in face-to-face genetic counseling.  This patient was discussed with Drs. Magrinat, Lindi Adie and/or Burr Medico who agrees with the above.    _______________________________________________________________________ For Office Staff:  Number of people involved in session: 1 Was an Intern/ student involved with case: no

## 2020-11-04 NOTE — Progress Notes (Signed)
[facility] CONSULT NOTE  Patient Care Team: [name], MD as PCP - General (Internal Medicine)  CHIEF COMPLAINTS/PURPOSE OF CONSULTATION:  Newly diagnosed breast cancer  HISTORY OF PRESENTING ILLNESS:  Destiny Mitchell 44 y.o. female is here because of recent diagnosis of right breast cancer.  She had a routine screening mammogram that detected abnormality in the right breast which led to additional mammograms ultrasounds and biopsies.  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  She was seen by Dr. Donne Hazel and she was referred to [place] for discussion regarding adjuvant treatment options.     I reviewed her records extensively and collaborated the history with the patient.  SUMMARY OF ONCOLOGIC HISTORY: Oncology History  Malignant neoplasm of upper-outer quadrant of right breast in female, estrogen receptor positive ([place])  10/31/2020 Initial Diagnosis    Screening detected right breast mass 10:30 position 1.7 cm by ultrasound, numerous benign-appearing cysts largest measuring 1.3 cm.  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   11/04/2020 Cancer Staging   Staging form: Breast, AJCC 8th Edition - Clinical: Stage IA (cT1c, cN0, cM0, G2, ER+, PR+, HER2-) - Signed by Nicholas Lose, MD on 11/04/2020      MEDICAL HISTORY:  Past Medical History:  Diagnosis Date  . Anxiety attack   . Back pain   . Chronic kidney disease   . Palpitations   . Pre-eclampsia, postpartum   . Sinus tachycardia   . SVD (spontaneous vaginal delivery) 06/22/2014  . Syncope and collapse   . Ureter obstruction    s/p surgery in 2009    SURGICAL HISTORY: Past Surgical History:  Procedure Laterality Date  . 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 US ECHOCARDIOGRAPHY   01/01/2005   EF 55-60%. NORMAL  . WISDOM TOOTH EXTRACTION      SOCIAL HISTORY: Social History   Socioeconomic History  . Marital status: Married    Spouse name: Not on file  . Number of children: Not on file  . Years of education: Not on file  . Highest education level: Not on file  Occupational History  . Not on file  Tobacco Use  . Smoking status: Never Smoker  . Smokeless tobacco: Never Used  Vaping Use  . Vaping Use: Never used  Substance and Sexual Activity  . Alcohol use: No  . Drug use: No  . Sexual activity: Yes  Other Topics Concern  . Not on file  Social History Narrative  . Not on file   Social Determinants of Health   Financial Resource Strain: Not on file  Food Insecurity: Not on file  Transportation Needs: Not on file  Physical Activity: Not on file  Stress: Not on file  Social Connections: Not on file  Intimate Partner Violence: Not on file    FAMILY HISTORY: Family History  Problem Relation Age of Onset  . Mitral valve prolapse Mother   . Heart attack Father   . Arrhythmia Sister     ALLERGIES:  has No Known Allergies.  MEDICATIONS:  Current Outpatient Medications  Medication Sig Dispense Refill  . hydrochlorothiazide (HYDRODIURIL) 25 MG tablet Take 1 tablet (25 mg total) by mouth daily. 90 tablet 3  . levonorgestrel (LILETTA) 19.5 MCG/DAY IUD IUD 1 Intra Uterine Device (1 each total)  by Intrauterine route once for 1 dose. 1 each 0  . tamoxifen (NOLVADEX) 20 MG tablet Take 1 tablet (20 mg total) by mouth daily. 90 tablet 3   No current facility-administered medications for this visit.    REVIEW OF SYSTEMS:   Constitutional: Denies fevers, chills or abnormal night sweats   All other systems were reviewed with the patient and are negative.  PHYSICAL EXAMINATION: ECOG PERFORMANCE STATUS: 0 - Asymptomatic  Vitals:   11/04/20 1258  BP: 133/77  Pulse: 79  Resp: 20  Temp: 98.6 F (37 C)  SpO2: 100%   Filed Weights   11/04/20 1258   Weight: 146 lb 8 oz (66.5 kg)       LABORATORY DATA:  I have reviewed the data as listed Lab Results  Component Value Date   WBC 4.3 04/07/2018   HGB 13.1 04/07/2018   HCT 39.6 04/07/2018   MCV 81 04/07/2018   PLT 219 04/07/2018   Lab Results  Component Value Date   NA 139 04/07/2018   K 4.0 04/07/2018   CL 100 04/07/2018   CO2 25 04/07/2018    RADIOGRAPHIC STUDIES: I have personally reviewed the radiological reports and agreed with the findings in the report.  ASSESSMENT AND PLAN:  Malignant neoplasm of upper-outer quadrant of right breast in female, estrogen receptor positive ([place]) Screening detected right breast mass 10:30 position 1.7 cm by ultrasound, numerous benign-appearing cysts largest measuring 1.3 cm.  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 T1c N0 stage Ia  Pathology and radiology counseling:Discussed with the patient, the details of pathology including the type of breast cancer,the clinical staging, the significance of ER, PR and HER-2/neu receptors and the implications for treatment. After reviewing the pathology in detail, we proceeded to discuss the different treatment options between surgery, radiation, chemotherapy, antiestrogen therapies.  Recommendations: 1. Breast conserving surgery followed by 2. Oncotype DX testing to determine if chemotherapy would be of any benefit followed by 3. Adjuvant radiation therapy followed by 4. Adjuvant antiestrogen therapy with tamoxifen 20 mg daily x10 years  Oncotype counseling: I discussed Oncotype DX test. I explained to the patient that this is a 21 gene panel to evaluate patient tumors DNA to calculate recurrence score. This would help determine whether patient has high risk or low risk breast cancer. She understands that if her tumor was found to be high risk, she would benefit from systemic chemotherapy. If low risk, no  need of chemotherapy.   Dr. Donne Hazel plans to do another breast MRI.  Because there is a possibility of delay from surgery standpoint, I recommended starting her on tamoxifen.  Tamoxifen counseling:We discussed the risks and benefits of tamoxifen. These include but not limited to insomnia, hot flashes, mood changes, vaginal dryness, and weight gain. Although rare, serious side effects including endometrial cancer, risk of blood clots were also discussed. We strongly believe that the benefits far outweigh the risks. Patient understands these risks and consented to starting treatment.  She will stop tamoxifen 1 week before surgery.  Return to clinic after surgery to discuss final pathology report and then determine if Oncotype DX testing will need to be sent.  All questions were answered. The patient knows to call the clinic with any problems, questions or concerns.    Harriette Ohara, MD 11/04/20

## 2020-11-04 NOTE — Assessment & Plan Note (Addendum)
Screening detected right breast mass 10:30 position 1.7 cm by ultrasound, numerous benign-appearing cysts largest measuring 1.3 cm.  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 T1c N0 stage Ia  Pathology and radiology counseling:Discussed with the patient, the details of pathology including the type of breast cancer,the clinical staging, the significance of ER, PR and HER-2/neu receptors and the implications for treatment. After reviewing the pathology in detail, we proceeded to discuss the different treatment options between surgery, radiation, chemotherapy, antiestrogen therapies.  Recommendations: 1. Breast conserving surgery followed by 2. Oncotype DX testing to determine if chemotherapy would be of any benefit followed by 3. Adjuvant radiation therapy followed by 4. Adjuvant antiestrogen therapy  Oncotype counseling: I discussed Oncotype DX test. I explained to the patient that this is a 21 gene panel to evaluate patient tumors DNA to calculate recurrence score. This would help determine whether patient has high risk or low risk breast cancer. She understands that if her tumor was found to be high risk, she would benefit from systemic chemotherapy. If low risk, no need of chemotherapy.   Return to clinic after surgery to discuss final pathology report and then determine if Oncotype DX testing will need to be sent.

## 2020-11-04 NOTE — Telephone Encounter (Signed)
LVM for call back to scheduled an appointment with Dr. Sondra Come.

## 2020-11-07 ENCOUNTER — Encounter: Payer: Self-pay | Admitting: *Deleted

## 2020-11-07 ENCOUNTER — Ambulatory Visit
Admission: RE | Admit: 2020-11-07 | Discharge: 2020-11-07 | Disposition: A | Discharge status: Home / Self Care | Payer: BLUE CROSS/BLUE SHIELD | Source: Ambulatory Visit | Attending: General Surgery | Admitting: General Surgery

## 2020-11-07 ENCOUNTER — Telehealth: Payer: Self-pay | Admitting: Nurse Practitioner

## 2020-11-07 DIAGNOSIS — C50411 Malignant neoplasm of upper-outer quadrant of right female breast: Secondary | ICD-10-CM

## 2020-11-07 MED ORDER — GADOBUTROL 1 MMOL/ML IV SOLN
6.0000 mL | Freq: Once | INTRAVENOUS | Status: AC | PRN
  Administered 2020-11-07: 6 mL via INTRAVENOUS

## 2020-11-07 NOTE — Telephone Encounter (Signed)
Destiny Mitchell reached out to me last week - she has been found to have breast cancer. She has seen Dr. Donne Hazel and Dr. Lindi Adie. To have breast MRI and genetic testing and then most likely proceed on with lumpectomy.   She has no cardiac complaints. She has had an abnormal EKG dating back to 2012. Basically normal echo in 2019. She is felt to be a stable candidate for surgery from our standpoint.   Burtis Junes, RN, [facility] [address] [place], [place]  [id number] [phone]

## 2020-11-08 ENCOUNTER — Other Ambulatory Visit: Payer: Self-pay | Admitting: General Surgery

## 2020-11-08 ENCOUNTER — Telehealth: Payer: Self-pay | Admitting: Hematology and Oncology

## 2020-11-08 DIAGNOSIS — C50411 Malignant neoplasm of upper-outer quadrant of right female breast: Secondary | ICD-10-CM

## 2020-11-08 NOTE — Telephone Encounter (Signed)
No 12/10 los, no changes made to pt schedule

## 2020-11-10 ENCOUNTER — Other Ambulatory Visit: Payer: Self-pay | Admitting: General Surgery

## 2020-11-10 ENCOUNTER — Encounter: Payer: Self-pay | Admitting: Licensed Clinical Social Worker

## 2020-11-10 ENCOUNTER — Ambulatory Visit
Admission: RE | Admit: 2020-11-10 | Discharge: 2020-11-10 | Disposition: A | Discharge status: Home / Self Care | Payer: BLUE CROSS/BLUE SHIELD | Source: Ambulatory Visit | Attending: Radiation Oncology | Admitting: Radiation Oncology

## 2020-11-10 ENCOUNTER — Other Ambulatory Visit: Payer: Self-pay

## 2020-11-10 ENCOUNTER — Encounter: Payer: Self-pay | Admitting: Radiation Oncology

## 2020-11-10 DIAGNOSIS — Z801 Family history of malignant neoplasm of trachea, bronchus and lung: Secondary | ICD-10-CM | POA: Diagnosis not present

## 2020-11-10 DIAGNOSIS — Z803 Family history of malignant neoplasm of breast: Secondary | ICD-10-CM | POA: Insufficient documentation

## 2020-11-10 DIAGNOSIS — Z800 Family history of malignant neoplasm of digestive organs: Secondary | ICD-10-CM | POA: Insufficient documentation

## 2020-11-10 DIAGNOSIS — F419 Anxiety disorder, unspecified: Secondary | ICD-10-CM | POA: Diagnosis not present

## 2020-11-10 DIAGNOSIS — Z170 Estrogen receptor positive status [ER+]: Secondary | ICD-10-CM

## 2020-11-10 DIAGNOSIS — C50411 Malignant neoplasm of upper-outer quadrant of right female breast: Secondary | ICD-10-CM | POA: Insufficient documentation

## 2020-11-10 DIAGNOSIS — Z8042 Family history of malignant neoplasm of prostate: Secondary | ICD-10-CM | POA: Insufficient documentation

## 2020-11-10 DIAGNOSIS — M549 Dorsalgia, unspecified: Secondary | ICD-10-CM | POA: Insufficient documentation

## 2020-11-10 NOTE — Progress Notes (Signed)
[facility] Psychosocial Distress Screening Clinical Social Work  Clinical Social Work was referred by distress screening protocol.  The patient scored a 7 on the Psychosocial Distress Thermometer which indicates moderate distress.  Patient declined social work referral. [place] may be consulted as needed in the future.   ONCBCN DISTRESS SCREENING 11/10/2020  Screening Type Initial Screening  Distress experienced in past week (1-10) 7  Referral to clinical social work No  Other declined social work referral    Holiday representative follow up needed: No.  If yes, follow up plan:  Destiny Laguardia E Destiny Eilers, LCSW

## 2020-11-10 NOTE — Progress Notes (Signed)
Patient here for a consult with Dr. Sondra Come.  Location of Breast Cancer:right breast.  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     Did patient present with symptoms  or was this found on  screening mammography on 11/24 and dx mammogram and u/s 12/4. Bx 12/6.  Past/Anticipated interventions by surgeon, if EXM:[id number] 12/01/2020  Past/Anticipated interventions by medical oncology, if any: Will start Tamoxifen after surgery.  Lymphedema issues, if any: n/a   Pain issues, if any:  no  SAFETY ISSUES:  Prior radiation? no  Pacemaker/ICD? no  Possible current pregnancy? Has IUD  Is the patient on methotrexate? No  Current Complaints / other details: Husband here with patient today.  BP 131/85   Pulse 83   Temp (!) 97.2 F (36.2 C) (Temporal)   Resp 20   Ht 5' 5.5" (1.664 m)   Wt 145 lb 3.2 oz (65.9 kg)   SpO2 100%   BMI 23.80 kg/m   Wt Readings from Last 3 Encounters:  11/10/20 145 lb 3.2 oz (65.9 kg)  11/04/20 146 lb 8 oz (66.5 kg)  06/07/20 144 lb 6.4 oz (65.5 kg)       De Burrs, RN 11/10/2020,12:16 PM  BP 131/85   Pulse 83   Temp (!) 97.2 F (36.2 C) (Temporal)   Resp 20   Ht 5' 5.5" (1.664 m)   Wt 145 lb 3.2 oz (65.9 kg)   SpO2 100%   BMI 23.80 kg/m ' Wt Readings from Last 3 Encounters:  11/10/20 145 lb 3.2 oz (65.9 kg)  11/04/20 146 lb 8 oz (66.5 kg)  06/07/20 144 lb 6.4 oz (65.5 kg)

## 2020-11-10 NOTE — Progress Notes (Signed)
Radiation Oncology         [phone] ________________________________  Initial Outpatient Consultation  Name: Destiny Mitchell MRN: [id number]  Date: 11/10/2020  DOB: 1976/07/12  FT:DDUK, Ravisankar, MD  Rolm Bookbinder, MD   REFERRING PHYSICIAN: Rolm Bookbinder, MD  DIAGNOSIS: The encounter diagnosis was Malignant neoplasm of upper-outer quadrant of right breast in female, estrogen receptor positive ([place]).  Stage IA (cT1c, cN0, cM0) Right Breast UOQ, Invasive Ductal Carcinoma with DCIS, ER+ / PR+ / Her2-, Grade 2  HISTORY OF PRESENT ILLNESS::Destiny Mitchell is a 44 y.o. female who is seen as a courtesy of Dr. Donne Hazel for an opinion concerning radiation therapy as part of management for her recently diagnosed breast cancer. Today, she is accompanied by her husband. She had routine screening mammography that showed a possible abnormality in the right breast. She underwent unilateral diagnostic mammography and right breast ultrasonography on 10/29/2020 that showed a suspicious 1.7 cm mass in the right breast at the 10:30 position.  Biopsy on 10/31/2020 showed: grade 2 invasive ductal carcinoma with ductal carcinoma in situ. Prognostic indicators were significant for: estrogen receptor, >95% positive and progesterone receptor, >95% positive, both with strong staining intensities. Ki67 proliferation marker of 10%. Her2 negative.  The patient was seen in consultation with Dr. Lindi Adie on 11/04/2020. At that time, it was recommended that she proceed with breast conserving surgery followed by oncotype DX testing to determine if chemotherapy would be of any benefit. That would then be followed by adjuvant radiation therapy and then adjuvant antiestrogen therapy.  MRI of bilateral breasts on 11/07/2020 showed the biopsy-proven carcinoma within the upper outer quadrant of the right breast at posterior depth that measured 1.7 cm at greatest dimension. There was no evidence of multifocal or  multicentric disease within the right breast, no evidence of contralateral malignancy in the left breast, and no enlarged or morphologically abnormal lymph nodes.  PREVIOUS RADIATION THERAPY: No  PAST MEDICAL HISTORY:  Past Medical History:  Diagnosis Date  . Anxiety attack   . Back pain   . Chronic kidney disease   . Family history of breast cancer   . Family history of colon cancer   . Family history of kidney cancer   . Family history of lung cancer   . Family history of pancreatic cancer   . Family history of prostate cancer   . Family history of stomach cancer   . Palpitations   . Pre-eclampsia, postpartum   . Sinus tachycardia   . SVD (spontaneous vaginal delivery) 06/22/2014  . Syncope and collapse   . Ureter obstruction    s/p surgery in 2009    PAST SURGICAL HISTORY: Past Surgical History:  Procedure Laterality Date  . 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 US ECHOCARDIOGRAPHY  01/01/2005   EF 55-60%. NORMAL  . WISDOM TOOTH EXTRACTION      FAMILY HISTORY:  Family History  Problem Relation Age of Onset  . Mitral valve prolapse Mother   . Lung cancer Mother 61       treated with surgery, smoker  . Kidney cancer Mother 46       treated with nephrectomy  . Heart attack Father   . Prostate cancer Father 57  . Arrhythmia Sister   . Heart disease Maternal Grandmother   . Cancer Maternal Grandfather        multiple primaries (stomach, colon, pancreas)  . Stomach cancer Paternal Grandfather 62  . Breast cancer Other  died 69, maternal great-aunt (MGF's sister)    SOCIAL HISTORY:  Social History   Tobacco Use  . Smoking status: Never Smoker  . Smokeless tobacco: Never Used  Vaping Use  . Vaping Use: Never used  Substance Use Topics  . Alcohol use: No  . Drug use: No    ALLERGIES: Not on File  MEDICATIONS:  Current Outpatient Medications  Medication Sig Dispense Refill  . hydrochlorothiazide (HYDRODIURIL) 25 MG tablet  Take 1 tablet (25 mg total) by mouth daily. 90 tablet 3  . levonorgestrel (LILETTA) 19.5 MCG/DAY IUD IUD 1 Intra Uterine Device (1 each total) by Intrauterine route once for 1 dose. 1 each 0   No current facility-administered medications for this encounter.    REVIEW OF SYSTEMS:  A 10+ POINT REVIEW OF SYSTEMS WAS OBTAINED including neurology, dermatology, psychiatry, cardiac, respiratory, lymph, extremities, GI, GU, musculoskeletal, constitutional, reproductive, HEENT.  Prior to diagnosis the patient denied any pain within the breast area nipple discharge or bleeding   PHYSICAL EXAM:  height is 5' 5.5" (1.664 m) and weight is 145 lb 3.2 oz (65.9 kg). Her temporal temperature is 97.2 F (36.2 C) (abnormal). Her blood pressure is 131/85 and her pulse is 83. Her respiration is 20 and oxygen saturation is 100%.   General: Alert and oriented, in no acute distress HEENT: Head is normocephalic. Extraocular movements are intact.  Neck: Neck is supple, no palpable cervical or supraclavicular lymphadenopathy. Heart: Regular in rate and rhythm with no murmurs, rubs, or gallops. Chest: Clear to auscultation bilaterally, with no rhonchi, wheezes, or rales. Abdomen: Soft, nontender, nondistended, with no rigidity or guarding. Extremities: No cyanosis or edema. Lymphatics: see Neck Exam Skin: No concerning lesions. Musculoskeletal: symmetric strength and muscle tone throughout. Neurologic: Cranial nerves II through XII are grossly intact. No obvious focalities. Speech is fluent. Coordination is intact. Psychiatric: Judgment and insight are intact. Affect is appropriate. Left breast: No palpable mass, nipple discharge, or bleeding. Right breast: Bruising noted in the upper outer quadrant.  Some induration at the site of the biopsy  ECOG = 0  0 - Asymptomatic (Fully active, able to carry on all predisease activities without restriction)  1 - Symptomatic but completely ambulatory (Restricted in physically  strenuous activity but ambulatory and able to carry out work of a light or sedentary nature. For example, light housework, office work)  2 - Symptomatic, <50% in bed during the day (Ambulatory and capable of all self care but unable to carry out any work activities. Up and about more than 50% of waking hours)  3 - Symptomatic, >50% in bed, but not bedbound (Capable of only limited self-care, confined to bed or chair 50% or more of waking hours)  4 - Bedbound (Completely disabled. Cannot carry on any self-care. Totally confined to bed or chair)  5 - Death   Eustace Pen MM, Creech RH, Tormey DC, et al. [phone]). "Toxicity and response criteria of the [facility]". [place] Oncol. 5 (6): 649-55  LABORATORY DATA:  Lab Results  Component Value Date   WBC 4.3 04/07/2018   HGB 13.1 04/07/2018   HCT 39.6 04/07/2018   MCV 81 04/07/2018   PLT 219 04/07/2018   NEUTROABS 2.7 04/07/2018   Lab Results  Component Value Date   NA 139 04/07/2018   K 4.0 04/07/2018   CL 100 04/07/2018   CO2 25 04/07/2018   GLUCOSE 86 04/07/2018   CREATININE 0.92 04/07/2018   CALCIUM 9.2 04/07/2018  RADIOGRAPHY: MR BREAST BILATERAL W WO CONTRAST INC CAD  Result Date: 11/07/2020 CLINICAL DATA:  Status post ultrasound-guided biopsy a RIGHT breast mass at the 10:30 o'clock axis on 10/31/2020 revealing invasive ductal carcinoma. LABS:  Not applicable EXAM: BILATERAL BREAST MRI WITH AND WITHOUT CONTRAST TECHNIQUE: Multiplanar, multisequence MR images of both breasts were obtained prior to and following the intravenous administration of 6 ml of Gadavist Three-dimensional MR images were rendered by post-processing of the original MR data on an independent workstation. The three-dimensional MR images were interpreted, and findings are reported in the following complete MRI report for this study. Three dimensional images were evaluated at the independent interpreting workstation using the DynaCAD thin  client. COMPARISON:  Previous exams including diagnostic mammogram and ultrasound dated 10/29/2020. No previous breast MRI. FINDINGS: Breast composition: d. Extreme fibroglandular tissue. Background parenchymal enhancement: Moderate. Right breast: Irregular enhancing mass within the upper-outer quadrant of the RIGHT breast, at posterior depth, measuring 1.7 x 1.3 x 1.7 cm, with associated biopsy clip, corresponding to patient's biopsy-proven invasive ductal carcinoma. No evidence of involvement of the underlying pectoralis musculature. No additional suspicious enhancing mass, non-mass enhancement or secondary signs of malignancy are identified within the RIGHT breast. Left breast: No suspicious enhancing mass, non-mass enhancement or secondary signs of malignancy are identified within the LEFT breast. Lymph nodes: No abnormal appearing lymph nodes. Ancillary findings:  None. IMPRESSION: 1. Biopsy-proven carcinoma within the upper-outer quadrant of the RIGHT breast, at posterior depth, measuring 1.7 cm greatest dimension, with associated biopsy clip. 2. No evidence of multifocal or multicentric disease within the RIGHT breast. 3. No evidence of contralateral malignancy within the LEFT breast. 4. No enlarged or morphologically abnormal lymph nodes. RECOMMENDATION: Per treatment plan for patient's known biopsy-proven carcinoma within the RIGHT breast. BI-RADS CATEGORY  6: Known biopsy-proven malignancy. Electronically Signed   By: Franki Cabot M.D.   On: 11/07/2020 16:26   US BREAST LTD UNI RIGHT INC AXILLA  Result Date: 10/29/2020 CLINICAL DATA:  Screening recall for possible right breast mass. EXAM: DIGITAL DIAGNOSTIC UNILATERAL RIGHT MAMMOGRAM WITH TOMO AND CAD; ULTRASOUND RIGHT BREAST LIMITED COMPARISON:  Previous exams. ACR Breast Density Category d: The breast tissue is extremely dense, which lowers the sensitivity of mammography. FINDINGS: Spot compression tomograms were performed over the upper-outer  posterior right breast demonstrating a 1.4 cm mass with margin irregularity. Mammographic images were processed with CAD. Targeted ultrasound of the upper-outer right breast was performed. There is an irregular hypoechoic mass in the right breast at the 10:30 position 8 cm from nipple measuring 1.3 x 1 x 1.7 cm. This corresponds well with the mass seen in the upper outer posterior right breast at mammography. Numerous additional benign appearing cysts are present in the upper-outer quadrant of the right breast. A lobulated cyst in the right breast at 10 o'clock 5 cm from nipple measures 1.3 x 0.6 x 1 cm. No lymphadenopathy seen in the right axilla. IMPRESSION: Suspicious 1.7 cm mass in the right breast at the 10:30 position. RECOMMENDATION: Recommend ultrasound-guided core biopsy of the mass in the right breast at the 10:30 position. I have discussed the findings and recommendations with the patient. If applicable, a reminder letter will be sent to the patient regarding the next appointment. BI-RADS CATEGORY  4: Suspicious. Electronically Signed   By: Everlean Alstrom M.D.   On: 10/29/2020 10:26   MM DIAG BREAST TOMO UNI RIGHT  Result Date: 10/29/2020 CLINICAL DATA:  Screening recall for possible right breast mass. EXAM: DIGITAL  DIAGNOSTIC UNILATERAL RIGHT MAMMOGRAM WITH TOMO AND CAD; ULTRASOUND RIGHT BREAST LIMITED COMPARISON:  Previous exams. ACR Breast Density Category d: The breast tissue is extremely dense, which lowers the sensitivity of mammography. FINDINGS: Spot compression tomograms were performed over the upper-outer posterior right breast demonstrating a 1.4 cm mass with margin irregularity. Mammographic images were processed with CAD. Targeted ultrasound of the upper-outer right breast was performed. There is an irregular hypoechoic mass in the right breast at the 10:30 position 8 cm from nipple measuring 1.3 x 1 x 1.7 cm. This corresponds well with the mass seen in the upper outer posterior right  breast at mammography. Numerous additional benign appearing cysts are present in the upper-outer quadrant of the right breast. A lobulated cyst in the right breast at 10 o'clock 5 cm from nipple measures 1.3 x 0.6 x 1 cm. No lymphadenopathy seen in the right axilla. IMPRESSION: Suspicious 1.7 cm mass in the right breast at the 10:30 position. RECOMMENDATION: Recommend ultrasound-guided core biopsy of the mass in the right breast at the 10:30 position. I have discussed the findings and recommendations with the patient. If applicable, a reminder letter will be sent to the patient regarding the next appointment. BI-RADS CATEGORY  4: Suspicious. Electronically Signed   By: Everlean Alstrom M.D.   On: 10/29/2020 10:26   MM CLIP PLACEMENT RIGHT  Result Date: 10/31/2020 CLINICAL DATA:  Post biopsy mammogram of the right breast for clip placement. EXAM: DIAGNOSTIC RIGHT MAMMOGRAM POST ULTRASOUND BIOPSY COMPARISON:  Previous exam(s). FINDINGS: Mammographic images were obtained following ultrasound guided biopsy of a mass in the right breast at 10:30. The biopsy marking clip is in expected position at the site of biopsy. IMPRESSION: Appropriate positioning of the ribbon shaped biopsy marking clip at the site of biopsy in the upper-outer right breast. Final Assessment: Post Procedure Mammograms for Marker Placement Electronically Signed   By: Ammie Ferrier M.D.   On: 10/31/2020 16:21   [place] RT BREAST BX W LOC DEV 1ST LESION IMG BX SPEC US GUIDE  Addendum Date: 11/02/2020   ADDENDUM REPORT: 11/02/2020 12:36 ADDENDUM: Pathology revealed GRADE II INVASIVE DUCTAL CARCINOMA, DUCTAL CARCINOMA IN SITU of Right breast, 10:30. This was found to be concordant by Dr. Ammie Ferrier. Pathology results were discussed with the patient by telephone. The patient reported doing well after the biopsy with tenderness at the site, and NO additional bleeding after QuickClot was applied at time of biopsy. Post biopsy instructions and  care were reviewed and questions were answered. The patient was encouraged to call [facility] for any additional concerns. My direct phone number was provided. Surgical consultation has been arranged with Dr. Rolm Bookbinder, per patient request, at [facility] on November 04, 2020. Medical oncology referral has been arranged with Dr. Nicholas Lose at [facility] on November 04, 2020. Consideration for a bilateral breast MRI for further evaluation of extent of disease given age and extremely dense breast tissue. Pathology results reported by Terie Purser, RN on 11/02/2020. Electronically Signed   By: Ammie Ferrier M.D.   On: 11/02/2020 12:36   Addendum Date: 10/31/2020   ADDENDUM REPORT: 10/31/2020 16:26 ADDENDUM: Following the procedure, after the first post biopsy mammogram image, the patient began bleeding again from the incision site. She was taken back to the ultrasound room, and manual compression was again applied until the bleeding stopped. A quick clot bandage was applied to the incision site and additional manual pressure was applied.  No additional bleeding was noted. She was given instructions to contact the [facility] during business hours, if there was any further concern, or if bleeding occurs after hours that she should proceed to urgent care or the emergency room for assistance. Electronically Signed   By: Ammie Ferrier M.D.   On: 10/31/2020 16:26   Result Date: 11/02/2020 CLINICAL DATA:  44 year old female presenting for ultrasound-guided biopsy of a right breast mass. EXAM: ULTRASOUND GUIDED RIGHT BREAST CORE NEEDLE BIOPSY COMPARISON:  Previous exam(s). PROCEDURE: I met with the patient and we discussed the procedure of ultrasound-guided biopsy, including benefits and alternatives. We discussed the high likelihood of a successful procedure. We discussed the risks of the procedure, including infection, bleeding, tissue injury,  clip migration, and inadequate sampling. Informed written consent was given. The usual time-out protocol was performed immediately prior to the procedure. Lesion quadrant: Upper outer quadrant Using sterile technique and 1% Lidocaine as local anesthetic, under direct ultrasound visualization, a 14 gauge spring-loaded device was used to perform biopsy of a mass in the right breast at 10:30 using an inferior approach. At the conclusion of the procedure a ribbon shaped tissue marker clip was deployed into the biopsy cavity. Follow up 2 view mammogram was performed and dictated separately. IMPRESSION: Ultrasound guided biopsy of a right breast mass at 10:30. No apparent complications. Electronically Signed: By: Ammie Ferrier M.D. On: 10/31/2020 15:50      IMPRESSION: Stage IA (cT1c, cN0, cM0) Right Breast UOQ, Invasive Ductal Carcinoma with DCIS, ER+ / PR+ / Her2-, Grade 2  The patient would appear to be an excellent candidate for breast conservation therapy with radiation therapy as a component.   Today, I talked to the patient and husband about the findings and work-up thus far.  We discussed the natural history of breast cancer and general treatment, highlighting the role of radiotherapy in the management.  We discussed the available radiation techniques, and focused on the details of logistics and delivery.  We reviewed the anticipated acute and late sequelae associated with radiation in this setting.  The patient was encouraged to ask questions that I answered to the best of my ability.   PLAN: The patient is scheduled to undergo right breast lumpectomy on 12/01/2020 under the care of Dr. Donne Hazel.  She will be seen approximately 4 weeks postop.  Assuming oncotype DX  testing shows low risk for recurrence then she will proceed with adjuvant radiation therapy beginning approximately 5 to 6 weeks postop.  She would appear to be a good candidate for hypofractionated accelerated radiation therapy over  approximately 4 weeks.  She is undergoing genetic testing given her family history.  She did bring in results of genetic testing from when one of  her sons was diagnosed with melanoma.  This was performed in 2018 at which time she underwent a [facility] melanoma panel.  There were no pathogenic sequence variants or deletions/duplications identified.  This document will be scanned into epic. Results from current genetic testing are pending at this time.     ------------------------------------------------  Blair Promise, PhD, MD  This document serves as a record of services personally performed by Gery Pray, MD. It was created on his behalf by Clerance Lav, a trained medical scribe. The creation of this record is based on the scribe's personal observations and the provider's statements to them. This document has been checked and approved by the attending provider.

## 2020-11-11 ENCOUNTER — Encounter: Payer: Self-pay | Admitting: *Deleted

## 2020-11-11 ENCOUNTER — Telehealth: Payer: Self-pay | Admitting: Hematology and Oncology

## 2020-11-11 NOTE — Telephone Encounter (Signed)
Scheduled appt per 12/17 sch msg - unable to reach pt . Left message for patient with appt date and time  And mailed reminder letter

## 2020-11-14 ENCOUNTER — Encounter: Payer: Self-pay | Admitting: Genetic Counselor

## 2020-11-14 ENCOUNTER — Encounter: Payer: Self-pay | Admitting: *Deleted

## 2020-11-14 ENCOUNTER — Ambulatory Visit: Payer: Self-pay | Admitting: Genetic Counselor

## 2020-11-14 ENCOUNTER — Telehealth: Payer: Self-pay | Admitting: *Deleted

## 2020-11-14 ENCOUNTER — Other Ambulatory Visit: Payer: Self-pay | Admitting: *Deleted

## 2020-11-14 ENCOUNTER — Telehealth: Payer: Self-pay | Admitting: Genetic Counselor

## 2020-11-14 DIAGNOSIS — Z1379 Encounter for other screening for genetic and chromosomal anomalies: Secondary | ICD-10-CM

## 2020-11-14 MED ORDER — TAMOXIFEN CITRATE 20 MG PO TABS: 20.0000 mg | ORAL_TABLET | Freq: Every day | ORAL | 11 refills | Status: DC

## 2020-11-14 MED FILL — TAMOXIFEN 20 MG TABLET: 20 | 30 days supply | Qty: 30 | Fill #0

## 2020-11-14 NOTE — Telephone Encounter (Signed)
Revealed negative genetic testing.  Discussed that we do not know why she has breast cancer or why there is cancer in the family. It could be due to a different gene that we are not testing, or maybe our current technology may not be able to pick something up.  It will be important for her to keep in contact with genetics to keep up with whether additional testing may be needed. 

## 2020-11-14 NOTE — Telephone Encounter (Signed)
LM on VM that results are back and to please call.  Left CB instructions. 

## 2020-11-14 NOTE — Progress Notes (Signed)
Per PA team, pt insurance will only cover Tamoxifen 20 mg tablets for a 30 day prescription and not a 90.  30 day prescription sent to pharmacy on file with refills.

## 2020-11-14 NOTE — Telephone Encounter (Signed)
Spoke to pt concerning prior auth for tamoxifen and hematoma at bx site. Msg sent to Dr. Lindi Adie nurse as well as prior authorization specialist. Informed pt that msg sent. Msg sent to Dr. Donne Hazel and nurse regarding hematoma site. No further questions voiced. Encourage pt to call with needs. Received verbal understanding.

## 2020-11-21 ENCOUNTER — Encounter: Payer: Self-pay | Admitting: Physical Therapy

## 2020-11-21 ENCOUNTER — Other Ambulatory Visit: Payer: Self-pay

## 2020-11-21 ENCOUNTER — Ambulatory Visit: Payer: BLUE CROSS/BLUE SHIELD | Attending: General Surgery | Admitting: Physical Therapy

## 2020-11-21 DIAGNOSIS — R293 Abnormal posture: Secondary | ICD-10-CM | POA: Diagnosis present

## 2020-11-21 DIAGNOSIS — C50411 Malignant neoplasm of upper-outer quadrant of right female breast: Secondary | ICD-10-CM | POA: Insufficient documentation

## 2020-11-21 DIAGNOSIS — Z170 Estrogen receptor positive status [ER+]: Secondary | ICD-10-CM | POA: Diagnosis present

## 2020-11-21 NOTE — Progress Notes (Signed)
HPI:  Ms. Stenseth was previously seen in the [place] clinic due to a personal and family history of cancer and concerns regarding a hereditary predisposition to cancer. Please refer to our prior cancer genetics clinic note for more information regarding our discussion, assessment and recommendations, at the time. Ms. Westbay recent genetic test results were disclosed to her, as were recommendations warranted by these results. These results and recommendations are discussed in more detail below.  CANCER HISTORY:  Oncology History  Malignant neoplasm of upper-outer quadrant of right breast in female, estrogen receptor positive ([place] )  10/31/2020 Initial Diagnosis    Screening detected right breast mass 10:30 position 1.7 cm by ultrasound, numerous benign-appearing cysts largest measuring 1.3 cm.  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   11/04/2020 Cancer Staging   Staging form: Breast, AJCC 8th Edition - Clinical: Stage IA (cT1c, cN0, cM0, G2, ER+, PR+, HER2-) - Signed by Nicholas Lose, MD on 11/04/2020   11/11/2020 Genetic Testing   Negative genetic testing on the STAT and Multicancer panel.  The Multi-Gene Panel offered by [facility] includes sequencing and/or deletion duplication testing of the following 85 genes: AIP, ALK, APC, ATM, AXIN2,BAP1,  BARD1, BLM, BMPR1A, BRCA1, BRCA2, BRIP1, CASR, CDC73, CDH1, CDK4, CDKN1B, CDKN1C, CDKN2A (p14ARF), CDKN2A (p16INK4a), CEBPA, CHEK2, CTNNA1, DICER1, DIS3L2, EGFR (c.2369C>T, p.Thr790Met variant only), EPCAM (Deletion/duplication testing only), FH, FLCN, GATA2, GPC3, GREM1 (Promoter region deletion/duplication testing only), HOXB13 (c.251G>A, p.Gly84Glu), HRAS, KIT, MAX, MEN1, MET, MITF (c.952G>A, p.Glu318Lys variant only), MLH1, MSH2, MSH3, MSH6, MUTYH, NBN, NF1, NF2, NTHL1, PALB2, PDGFRA, PHOX2B, PMS2, POLD1, POLE, POT1, PRKAR1A, PTCH1, PTEN, RAD50,  RAD51C, RAD51D, RB1, RECQL4, RET, RNF43, RUNX1, SDHAF2, SDHA (sequence changes only), SDHB, SDHC, SDHD, SMAD4, SMARCA4, SMARCB1, SMARCE1, STK11, SUFU, TERC, TERT, TMEM127, TP53, TSC1, TSC2, VHL, WRN and WT1.  The report date is 11/13/2020.     FAMILY HISTORY:  We obtained a detailed, 4-generation family history.  Significant diagnoses are listed below: Family History  Problem Relation Age of Onset   Mitral valve prolapse Mother    Lung cancer Mother 35       treated with surgery, smoker   Kidney cancer Mother 30       treated with nephrectomy   Heart attack Father    Prostate cancer Father 40   Arrhythmia Sister    Heart disease Maternal Grandmother    Cancer Maternal Grandfather        multiple primaries (stomach, colon, pancreas)   Stomach cancer Paternal Grandfather 62   Breast cancer Other        died 56, maternal great-aunt (MGF's sister)   Ms. Angelo has two sons (ages 55 and 56). She two sisters (ages 2 and 43) and one niece (age 29). None of these family members have had cancer.  Ms. Knodel mother is 89 and has a history of lung cancer (diagnosed age 69) and kidney cancer (diagnosed age 78), as well as a history of smoking. Ms. Sanzone has one maternal aunt and two female maternal cousins. Her maternal grandmother died age 5 with heart disease, and her maternal grandfather died age 56 with multiple primary cancers (possibly colon, stomach, and pancreatic). She had a great-aunt (maternal grandfather's sister) who died at age 35 from breast cancer.  Ms. Jalbert father is 56 and was diagnosed with prostate cancer at age 63. There are no paternal aunts or uncles. Her paternal grandmother is  currently living at age 76, and her paternal grandfather died at age 49 from stomach cancer (diagnosed age 25).   Ms. Dressel is unaware of previous family history of genetic testing for hereditary cancer risks. Patient's ancestors are of unknown descent. There is no reported  Ashkenazi Jewish ancestry. There is no known consanguinity.  GENETIC TEST RESULTS: Genetic testing reported out on 11/13/2020 through the [facility] Breast Cancer STAT panel and Multi-Cancer panel. No pathogenic variants were detected.   The Breast Cancer STAT Panel offered by [facility] includes sequencing and deletion/duplication analysis for the following 9 genes:  ATM, BRCA1, BRCA2, CDH1, CHEK2, PALB2, PTEN, STK11 and TP53. The Multi-Cancer Panel offered by [facility] includes sequencing and/or deletion duplication testing of the following 85 genes: AIP, ALK, APC, ATM, AXIN2,BAP1,  BARD1, BLM, BMPR1A, BRCA1, BRCA2, BRIP1, CASR, CDC73, CDH1, CDK4, CDKN1B, CDKN1C, CDKN2A (p14ARF), CDKN2A (p16INK4a), CEBPA, CHEK2, CTNNA1, DICER1, DIS3L2, EGFR (c.2369C>T, p.Thr790Met variant only), EPCAM (Deletion/duplication testing only), FH, FLCN, GATA2, GPC3, GREM1 (Promoter region deletion/duplication testing only), HOXB13 (c.251G>A, p.Gly84Glu), HRAS, KIT, MAX, MEN1, MET, MITF (c.952G>A, p.Glu318Lys variant only), MLH1, MSH2, MSH3, MSH6, MUTYH, NBN, NF1, NF2, NTHL1, PALB2, PDGFRA, PHOX2B, PMS2, POLD1, POLE, POT1, PRKAR1A, PTCH1, PTEN, RAD50, RAD51C, RAD51D, RB1, RECQL4, RET, RNF43, RUNX1, SDHAF2, SDHA (sequence changes only), SDHB, SDHC, SDHD, SMAD4, SMARCA4, SMARCB1, SMARCE1, STK11, SUFU, TERC, TERT, TMEM127, TP53, TSC1, TSC2, VHL, WRN and WT1. The test report will be scanned into EPIC and located under the Molecular Pathology section of the Results Review tab.  A portion of the result report is included below for reference.     We discussed with Ms. Stachowski that because current genetic testing is not perfect, it is possible there may be a gene mutation in one of these genes that current testing cannot detect, but that chance is small.  We also discussed that there could be another gene that has not yet been discovered, or that we have not yet tested, that is responsible for the cancer diagnoses in the family. It is also  possible there is a hereditary cause for the cancer in the family that Ms. Orzechowski did not inherit and therefore was not identified in her testing.  Therefore, it is important to remain in touch with cancer genetics in the future so that we can continue to offer Ms. Tatham the most up to date genetic testing.   CANCER SCREENING RECOMMENDATIONS: Ms. Phetteplace test result is considered negative (normal).  This means that we have not identified a hereditary cause for her personal and family history of cancer at this time. While reassuring, this does not definitively rule out a hereditary predisposition to cancer. It is still possible that there could be genetic mutations that are undetectable by current technology. There could be genetic mutations in genes that have not been tested or identified to increase cancer risk.  Therefore, it is recommended she continue to follow the cancer management and screening guidelines provided by her oncology and primary healthcare provider.   An individual's cancer risk and medical management are not determined by genetic test results alone. Overall cancer risk assessment incorporates additional factors, including personal medical history, family history, and any available genetic information that may result in a personalized plan for cancer prevention and surveillance.  RECOMMENDATIONS FOR FAMILY MEMBERS:  Individuals in this family might be at some increased risk of developing cancer, over the general population risk, simply due to the family history of cancer.  We recommended women in this family have a yearly mammogram  beginning at age 1, or 42 years younger than the earliest onset of cancer, an annual clinical breast exam, and perform monthly breast self-exams. Women in this family should also have a gynecological exam as recommended by their primary provider. All family members should be referred for colonoscopy starting at age 32.  FOLLOW-UP: Lastly, we discussed with  Ms. Brockway that cancer genetics is a rapidly advancing field and it is possible that new genetic tests will be appropriate for her and/or her family members in the future. We encouraged her to remain in contact with cancer genetics on an annual basis so we can update her personal and family histories and let her know of advances in cancer genetics that may benefit this family.   Our contact number was provided. Ms. Ranes questions were answered to her satisfaction, and she knows she is welcome to call us at anytime with additional questions or concerns.   Clint Guy, MS, [facility] Genetic Counselor [email or web address] Phone: [phone]

## 2020-11-21 NOTE — Therapy (Addendum)
[place], [place], [address] Phone: [phone]   Fax:  [phone]  Physical Therapy Evaluation  Patient Details  Name: Destiny Mitchell MRN: [id number] Date of Birth: 09-01-1976 Referring Provider (PT): Dr. Rolm Bookbinder   Encounter Date: 11/21/2020   PT End of Session - 11/21/20 1523    Visit Number 1    Number of Visits 2    Date for PT Re-Evaluation 01/16/21    PT Start Time 1400    PT Stop Time 1450    PT Time Calculation (min) 50 min    Activity Tolerance Patient tolerated treatment well    Behavior During Therapy [facility] for tasks assessed/performed           Past Medical History:  Diagnosis Date  . Anxiety attack   . Back pain   . Chronic kidney disease   . Family history of breast cancer   . Family history of colon cancer   . Family history of kidney cancer   . Family history of lung cancer   . Family history of pancreatic cancer   . Family history of prostate cancer   . Family history of stomach cancer   . Palpitations   . Pre-eclampsia, postpartum   . Sinus tachycardia   . SVD (spontaneous vaginal delivery) 06/22/2014  . Syncope and collapse   . Ureter obstruction    s/p surgery in 2009    Past Surgical History:  Procedure Laterality Date  . 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 US ECHOCARDIOGRAPHY  01/01/2005   EF 55-60%. NORMAL  . WISDOM TOOTH EXTRACTION      There were no vitals filed for this visit.    Subjective Assessment - 11/21/20 1409    Subjective Patient reports she is here today for her baseline assessment prior to her surgery.    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 It measures 1.7 cm and is located in the upper outer quadrant. It is ER/PR positive and HER2 negative with a Ki67 of 10%.    Patient Stated Goals Reduce lymphedema risk and learn post op shoulder  ROM HEP    Currently in Pain? No/denies              [facility] PT Assessment - 11/21/20 0001      Assessment   Medical Diagnosis Right breast cancer    Referring Provider (PT) Dr. Rolm Bookbinder    Onset Date/Surgical Date 10/31/20    Hand Dominance Right    Prior Therapy None      Precautions   Precautions Other (comment)    Precaution Comments active cancer      Restrictions   Weight Bearing Restrictions No      Balance Screen   Has the patient fallen in the past 6 months No    Has the patient had a decrease in activity level because of a fear of falling?  No    Is the patient reluctant to leave their home because of a fear of falling?  No      Home Environment   Living Environment Private residence    Living Arrangements Spouse/significant other;Children   Husband, 48 and 27 y.o. 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      Cognition   Overall Cognitive Status Within Functional Limits for tasks assessed      Posture/Postural Control   Posture/Postural Control Postural limitations    Postural Limitations Rounded Shoulders      ROM / Strength   AROM / PROM / Strength AROM;Strength      AROM   Overall AROM Comments Cervical AROM is WNL    AROM Assessment Site Shoulder    Right/Left Shoulder Right;Left    Right Shoulder Extension 57 Degrees    Right Shoulder Flexion 150 Degrees    Right Shoulder ABduction 160 Degrees    Right Shoulder Internal Rotation 49 Degrees    Right Shoulder External Rotation 79 Degrees    Left Shoulder Extension 64 Degrees    Left Shoulder Flexion 142 Degrees    Left Shoulder ABduction 157 Degrees    Left Shoulder Internal Rotation 52 Degrees    Left Shoulder External Rotation 87 Degrees      Strength   Overall Strength Within  functional limits for tasks performed             LYMPHEDEMA/ONCOLOGY QUESTIONNAIRE - 11/21/20 0001      Type   Cancer Type Right breast cancer      Lymphedema Assessments   Lymphedema Assessments Upper extremities      Right Upper Extremity Lymphedema   10 cm Proximal to Olecranon Process 26.3 cm    Olecranon Process 24.8 cm    10 cm Proximal to Ulnar Styloid Process 22.2 cm    Just Proximal to Ulnar Styloid Process 16.3 cm    Across Hand at [employer] 19 cm    At [facility] of 2nd Digit 6.3 cm      Left Upper Extremity Lymphedema   10 cm Proximal to Olecranon Process 26 cm    Olecranon Process 24.6 cm    10 cm Proximal to Ulnar Styloid Process 21.2 cm    Just Proximal to Ulnar Styloid Process 15.7 cm    Across Hand at [employer] 19.5 cm    At [facility] of 2nd Digit 6 cm           L-DEX FLOWSHEETS - 11/21/20 1500      L-DEX LYMPHEDEMA SCREENING   Measurement Type Unilateral    L-DEX MEASUREMENT EXTREMITY Upper Extremity    POSITION  Standing    DOMINANT SIDE Right    At Risk Side Right    BASELINE SCORE (UNILATERAL) -0.5           The patient was assessed using the L-Dex machine today to produce a lymphedema index baseline score. The patient will be reassessed on a regular basis (typically every 3 months) to obtain new L-Dex scores. If the score is > 6.5 points away from his/her baseline score indicating onset of subclinical lymphedema, it will be recommended to wear a compression garment for 4 weeks, 12 hours per day and then be reassessed. If the score continues to be > 6.5 points from baseline at reassessment, we will initiate lymphedema treatment. Assessing in this manner has a 95% rate of preventing clinically significant lymphedema.      Katina Dung - 11/21/20 0001    Open a tight or new jar No difficulty    Do heavy household chores (wash walls, wash floors) No difficulty    Carry a shopping bag or briefcase  No difficulty    Wash your back No difficulty     Use a knife to cut food No difficulty    Recreational activities in which you take some force or impact through your arm, shoulder, or hand (golf, hammering, tennis) No difficulty    During the past week, to what extent has your arm, shoulder or hand problem interfered with your normal social activities with family, friends, neighbors, or groups? Not at all    During the past week, to what extent has your arm, shoulder or hand problem limited your work or other regular daily activities Not at all    Arm, shoulder, or hand pain. Mild    Tingling (pins and needles) in your arm, shoulder, or hand None    Difficulty Sleeping No difficulty    DASH Score 2.27 %            Objective measurements completed on examination: See above findings.        Patient was instructed today in a home exercise program today for post op shoulder range of motion. These included active assist shoulder flexion in sitting, scapular retraction, wall walking with shoulder abduction, and hands behind head external rotation.  She was encouraged to do these twice a day, holding 3 seconds and repeating 5 times when permitted by her physician.           PT Education - 11/21/20 1515    Education Details Lymphedema risk reduction and post op shoulder ROM HEP    Person(s) Educated Patient    Methods Explanation;Demonstration;Handout    Comprehension Returned demonstration;Verbalized understanding               PT Long Term Goals - 11/21/20 1527      PT LONG TERM GOAL #1   Title Patient will demonstrate she has regained full shoulder ROM and function post operatively compared to baselines.    Time 8    Period Weeks    Status New    Target Date 01/16/21           Breast Clinic Goals - 11/21/20 1527      Patient will be able to verbalize understanding of pertinent lymphedema risk reduction practices relevant to her diagnosis specifically related to skin care.   Time 1    Period Days    Status  Achieved      Patient will be able to return demonstrate and/or verbalize understanding of the post-op home exercise program related to regaining shoulder range of motion.   Time 1    Period Days    Status Achieved      Patient will be able to verbalize understanding of the importance of attending the postoperative After Breast Cancer Class for further lymphedema risk reduction education and therapeutic exercise.   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 It measures 1.7 cm and is located in the upper outer quadrant. It is ER/PR positive and HER2 negative with a Ki67 of 10%. She was referred to outpatient PT for a baseline assessment realted to upper extremity ROM and function and to get a baseline L-Dex score on the SOZO. She is planning to have a right lumpectomy and sentinel node biopsy on 12/01/2020 followed by  Oncotype testing, radiation and anti-estrogen therapy. She will benefit from a post op PT reassessment to determine needs and from L-Dex screens every 3 months following surgery to detect subclinical lymphedema.    Stability/Clinical Decision Making Stable/Uncomplicated    Clinical Decision Making Low    Rehab Potential Excellent    PT Frequency --   Eval and 1 f/u visit   PT Treatment/Interventions ADLs/Self Care Home Management;Therapeutic exercise;Patient/family education    PT Next Visit Plan Will reassess 3-4 weeks post op to determine needs    PT Home Exercise Plan Post op shoulder ROM HEP    Consulted and Agree with Plan of Care Patient           Patient will benefit from skilled therapeutic intervention in order to improve the following deficits and impairments:  Postural dysfunction,Decreased range of motion,Decreased knowledge of precautions,Impaired UE functional use,Pain  Visit Diagnosis: Malignant  neoplasm of upper-outer quadrant of right breast in female, estrogen receptor positive ([place]) - Plan: PT plan of care cert/re-cert  Abnormal posture - Plan: PT plan of care cert/re-cert     Problem List Patient Active Problem List   Diagnosis Date Noted  . Genetic testing 11/14/2020  . Malignant neoplasm of upper-outer quadrant of right breast in female, estrogen receptor positive ([place]) 11/04/2020  . Family history of prostate cancer   . Family history of lung cancer   . Family history of kidney cancer   . Family history of stomach cancer   . Family history of breast cancer   . Family history of colon cancer   . Family history of pancreatic cancer   . Dysuria 06/09/2019  . Vaginal discharge 06/09/2019  . Urinary urgency 06/09/2019  . Mallet finger of left hand 12/06/2017  . Pain of left hand 12/06/2017  . Normal labor 06/22/2014  . SVD (spontaneous vaginal delivery) 06/22/2014  . Heart palpitations 09/10/2011  . ACUTE MAXILLARY SINUSITIS 08/27/2009   Annia Friendly, PT 11/21/20 3:34 PM  [place], [place], [address] Phone: [phone]   Fax:  [phone]  Name: AMARIANA MIRANDO MRN: [id number] Date of Birth: January 25, 1976

## 2020-11-21 NOTE — Patient Instructions (Signed)

## 2020-11-23 ENCOUNTER — Encounter (HOSPITAL_BASED_OUTPATIENT_CLINIC_OR_DEPARTMENT_OTHER): Payer: Self-pay | Admitting: General Surgery

## 2020-11-23 ENCOUNTER — Other Ambulatory Visit: Payer: Self-pay

## 2020-11-24 ENCOUNTER — Encounter (HOSPITAL_BASED_OUTPATIENT_CLINIC_OR_DEPARTMENT_OTHER)
Admission: RE | Admit: 2020-11-24 | Discharge: 2020-11-24 | Disposition: A | Discharge status: Home / Self Care | Payer: BLUE CROSS/BLUE SHIELD | Source: Ambulatory Visit | Attending: General Surgery | Admitting: General Surgery

## 2020-11-24 DIAGNOSIS — Z01812 Encounter for preprocedural laboratory examination: Secondary | ICD-10-CM | POA: Diagnosis not present

## 2020-11-24 LAB — BASIC METABOLIC PANEL
Anion gap: 10 (ref 5–15)
BUN: 23 mg/dL — ABNORMAL HIGH (ref 6–20)
CO2: 26 mmol/L (ref 22–32)
Calcium: 9.8 mg/dL (ref 8.9–10.3)
Chloride: 101 mmol/L (ref 98–111)
Creatinine, Ser: 0.81 mg/dL (ref 0.44–1.00)
GFR, Estimated: >60 mL/min (ref >60)
Glucose, Bld: 110 mg/dL — ABNORMAL HIGH (ref 70–99)
Potassium: 3.2 mmol/L — ABNORMAL LOW (ref 3.5–5.1)
Sodium: 137 mmol/L (ref 135–145)

## 2020-11-24 NOTE — Progress Notes (Signed)

## 2020-11-26 DIAGNOSIS — C50919 Malignant neoplasm of unspecified site of unspecified female breast: Secondary | ICD-10-CM

## 2020-11-26 HISTORY — DX: Malignant neoplasm of unspecified site of unspecified female breast: C50.919

## 2020-11-28 ENCOUNTER — Other Ambulatory Visit (HOSPITAL_COMMUNITY)
Admission: RE | Admit: 2020-11-28 | Discharge: 2020-11-28 | Disposition: A | Discharge status: Home / Self Care | Payer: BLUE CROSS/BLUE SHIELD | Source: Ambulatory Visit | Attending: General Surgery | Admitting: General Surgery

## 2020-11-28 DIAGNOSIS — C50411 Malignant neoplasm of upper-outer quadrant of right female breast: Secondary | ICD-10-CM | POA: Diagnosis not present

## 2020-11-28 DIAGNOSIS — Z8042 Family history of malignant neoplasm of prostate: Secondary | ICD-10-CM | POA: Diagnosis not present

## 2020-11-28 DIAGNOSIS — Z170 Estrogen receptor positive status [ER+]: Secondary | ICD-10-CM | POA: Diagnosis not present

## 2020-11-28 DIAGNOSIS — Z20822 Contact with and (suspected) exposure to covid-19: Secondary | ICD-10-CM | POA: Insufficient documentation

## 2020-11-28 DIAGNOSIS — Z01812 Encounter for preprocedural laboratory examination: Secondary | ICD-10-CM | POA: Insufficient documentation

## 2020-11-28 DIAGNOSIS — Z79899 Other long term (current) drug therapy: Secondary | ICD-10-CM | POA: Diagnosis not present

## 2020-11-28 DIAGNOSIS — Z85820 Personal history of malignant melanoma of skin: Secondary | ICD-10-CM | POA: Diagnosis not present

## 2020-11-28 MED FILL — HYDROCHLOROTHIAZIDE 25 MG T: 25 | 30 days supply | Qty: 30 | Fill #4

## 2020-11-29 LAB — SARS CORONAVIRUS 2 (TAT 6-24 HRS): SARS Coronavirus 2: NEGATIVE

## 2020-11-30 ENCOUNTER — Ambulatory Visit
Admission: RE | Admit: 2020-11-30 | Discharge: 2020-11-30 | Disposition: A | Discharge status: Home / Self Care | Payer: BLUE CROSS/BLUE SHIELD | Source: Ambulatory Visit | Attending: General Surgery | Admitting: General Surgery

## 2020-11-30 ENCOUNTER — Other Ambulatory Visit: Payer: Self-pay

## 2020-11-30 DIAGNOSIS — C50411 Malignant neoplasm of upper-outer quadrant of right female breast: Secondary | ICD-10-CM

## 2020-12-01 ENCOUNTER — Ambulatory Visit
Admission: RE | Admit: 2020-12-01 | Discharge: 2020-12-01 | Disposition: A | Discharge status: Home / Self Care | Payer: BLUE CROSS/BLUE SHIELD | Source: Ambulatory Visit | Attending: General Surgery | Admitting: General Surgery

## 2020-12-01 ENCOUNTER — Other Ambulatory Visit (HOSPITAL_BASED_OUTPATIENT_CLINIC_OR_DEPARTMENT_OTHER): Payer: Self-pay | Admitting: General Surgery

## 2020-12-01 ENCOUNTER — Ambulatory Visit (HOSPITAL_BASED_OUTPATIENT_CLINIC_OR_DEPARTMENT_OTHER): Payer: BLUE CROSS/BLUE SHIELD | Admitting: Anesthesiology

## 2020-12-01 ENCOUNTER — Other Ambulatory Visit: Payer: Self-pay

## 2020-12-01 ENCOUNTER — Ambulatory Visit (HOSPITAL_COMMUNITY)
Admission: RE | Admit: 2020-12-01 | Discharge: 2020-12-01 | Disposition: A | Discharge status: Home / Self Care | Payer: BLUE CROSS/BLUE SHIELD | Source: Ambulatory Visit | Attending: General Surgery | Admitting: General Surgery

## 2020-12-01 ENCOUNTER — Encounter (HOSPITAL_BASED_OUTPATIENT_CLINIC_OR_DEPARTMENT_OTHER): Payer: Self-pay | Admitting: General Surgery

## 2020-12-01 ENCOUNTER — Encounter (HOSPITAL_BASED_OUTPATIENT_CLINIC_OR_DEPARTMENT_OTHER)
Admission: RE | Disposition: A | Discharge status: Home / Self Care | Payer: Self-pay | Source: Home / Self Care | Attending: General Surgery

## 2020-12-01 ENCOUNTER — Ambulatory Visit (HOSPITAL_BASED_OUTPATIENT_CLINIC_OR_DEPARTMENT_OTHER)
Admission: RE | Admit: 2020-12-01 | Discharge: 2020-12-01 | Disposition: A | Discharge status: Home / Self Care | Payer: BLUE CROSS/BLUE SHIELD | Attending: General Surgery | Admitting: General Surgery

## 2020-12-01 DIAGNOSIS — Z85820 Personal history of malignant melanoma of skin: Secondary | ICD-10-CM | POA: Insufficient documentation

## 2020-12-01 DIAGNOSIS — Z8042 Family history of malignant neoplasm of prostate: Secondary | ICD-10-CM | POA: Insufficient documentation

## 2020-12-01 DIAGNOSIS — Z20822 Contact with and (suspected) exposure to covid-19: Secondary | ICD-10-CM | POA: Insufficient documentation

## 2020-12-01 DIAGNOSIS — Z170 Estrogen receptor positive status [ER+]: Secondary | ICD-10-CM

## 2020-12-01 DIAGNOSIS — Z79899 Other long term (current) drug therapy: Secondary | ICD-10-CM | POA: Insufficient documentation

## 2020-12-01 DIAGNOSIS — C50411 Malignant neoplasm of upper-outer quadrant of right female breast: Secondary | ICD-10-CM | POA: Diagnosis not present

## 2020-12-01 HISTORY — PX: BREAST LUMPECTOMY: SHX2

## 2020-12-01 HISTORY — PX: BREAST LUMPECTOMY WITH RADIOACTIVE SEED AND SENTINEL LYMPH NODE BIOPSY: SHX6550

## 2020-12-01 LAB — POCT PREGNANCY, URINE: Preg Test, Ur: NEGATIVE

## 2020-12-01 SURGERY — BREAST LUMPECTOMY WITH RADIOACTIVE SEED AND SENTINEL LYMPH NODE BIOPSY
Anesthesia: Regional | Site: Breast | Laterality: Right

## 2020-12-01 MED ORDER — SODIUM CHLORIDE (PF) 0.9 % IJ SOLN
INTRAMUSCULAR | Status: AC
  Filled 2020-12-01: qty 10

## 2020-12-01 MED ORDER — ONDANSETRON HCL 4 MG/2ML IJ SOLN
INTRAMUSCULAR | Status: DC | PRN
  Administered 2020-12-01: 4 mg via INTRAVENOUS

## 2020-12-01 MED ORDER — FENTANYL CITRATE (PF) 100 MCG/2ML IJ SOLN
INTRAMUSCULAR | Status: AC
  Filled 2020-12-01: qty 2

## 2020-12-01 MED ORDER — ONDANSETRON HCL 4 MG/2ML IJ SOLN
INTRAMUSCULAR | Status: AC
  Filled 2020-12-01: qty 2

## 2020-12-01 MED ORDER — TECHNETIUM TC 99M TILMANOCEPT KIT
1.0000 | PACK | Freq: Once | INTRAVENOUS | Status: AC | PRN
  Administered 2020-12-01: 1 via INTRADERMAL

## 2020-12-01 MED ORDER — OXYCODONE HCL 5 MG PO TABS: 5.0000 mg | ORAL_TABLET | Freq: Once | ORAL | Status: DC | PRN

## 2020-12-01 MED ORDER — ONDANSETRON HCL 4 MG/2ML IJ SOLN: 4.0000 mg | Freq: Once | INTRAMUSCULAR | Status: DC | PRN

## 2020-12-01 MED ORDER — BUPIVACAINE HCL (PF) 0.25 % IJ SOLN
INTRAMUSCULAR | Status: AC
  Filled 2020-12-01: qty 30

## 2020-12-01 MED ORDER — DEXAMETHASONE SODIUM PHOSPHATE 10 MG/ML IJ SOLN
INTRAMUSCULAR | Status: AC
  Filled 2020-12-01: qty 1

## 2020-12-01 MED ORDER — FENTANYL CITRATE (PF) 100 MCG/2ML IJ SOLN: 25.0000 ug | INTRAMUSCULAR | Status: DC | PRN

## 2020-12-01 MED ORDER — KETOROLAC TROMETHAMINE 15 MG/ML IJ SOLN
INTRAMUSCULAR | Status: AC
  Filled 2020-12-01: qty 1

## 2020-12-01 MED ORDER — ACETAMINOPHEN 500 MG PO TABS
ORAL_TABLET | ORAL | Status: AC
  Filled 2020-12-01: qty 2

## 2020-12-01 MED ORDER — PROPOFOL 10 MG/ML IV BOLUS
INTRAVENOUS | Status: AC
  Filled 2020-12-01: qty 20

## 2020-12-01 MED ORDER — PROPOFOL 10 MG/ML IV BOLUS
INTRAVENOUS | Status: DC | PRN
  Administered 2020-12-01: 160 mg via INTRAVENOUS

## 2020-12-01 MED ORDER — BUPIVACAINE HCL (PF) 0.25 % IJ SOLN
INTRAMUSCULAR | Status: DC | PRN
  Administered 2020-12-01: 4 mL

## 2020-12-01 MED ORDER — MIDAZOLAM HCL 2 MG/2ML IJ SOLN
2.0000 mg | Freq: Once | INTRAMUSCULAR | Status: AC
  Administered 2020-12-01: 2 mg via INTRAVENOUS

## 2020-12-01 MED ORDER — LACTATED RINGERS IV SOLN: INTRAVENOUS | Status: DC

## 2020-12-01 MED ORDER — TRAMADOL HCL 50 MG PO TABS
50.0000 mg | ORAL_TABLET | Freq: Four times a day (QID) | ORAL | 0 refills | Status: DC | PRN
Start: 2020-12-01 — End: 2020-12-01

## 2020-12-01 MED ORDER — MIDAZOLAM HCL 2 MG/2ML IJ SOLN
INTRAMUSCULAR | Status: AC
  Filled 2020-12-01: qty 2

## 2020-12-01 MED ORDER — LIDOCAINE 2% (20 MG/ML) 5 ML SYRINGE
INTRAMUSCULAR | Status: AC
  Filled 2020-12-01: qty 5

## 2020-12-01 MED ORDER — LIDOCAINE HCL (CARDIAC) PF 100 MG/5ML IV SOSY
PREFILLED_SYRINGE | INTRAVENOUS | Status: DC | PRN
  Administered 2020-12-01: 50 mg via INTRAVENOUS

## 2020-12-01 MED ORDER — FENTANYL CITRATE (PF) 100 MCG/2ML IJ SOLN
INTRAMUSCULAR | Status: DC | PRN
  Administered 2020-12-01: 50 ug via INTRAVENOUS

## 2020-12-01 MED ORDER — FENTANYL CITRATE (PF) 100 MCG/2ML IJ SOLN
100.0000 ug | Freq: Once | INTRAMUSCULAR | Status: AC
Start: 2020-12-01 — End: 2020-12-01
  Administered 2020-12-01: 100 ug via INTRAVENOUS

## 2020-12-01 MED ORDER — CEFAZOLIN SODIUM-DEXTROSE 2-4 GM/100ML-% IV SOLN
2.0000 g | INTRAVENOUS | Status: AC
  Administered 2020-12-01: 2 g via INTRAVENOUS

## 2020-12-01 MED ORDER — DEXAMETHASONE SODIUM PHOSPHATE 4 MG/ML IJ SOLN
INTRAMUSCULAR | Status: DC | PRN
  Administered 2020-12-01: 10 mg via INTRAVENOUS

## 2020-12-01 MED ORDER — PROPOFOL 500 MG/50ML IV EMUL
INTRAVENOUS | Status: AC
  Filled 2020-12-01: qty 50

## 2020-12-01 MED ORDER — ACETAMINOPHEN 500 MG PO TABS
1000.0000 mg | ORAL_TABLET | ORAL | Status: AC
  Administered 2020-12-01: 1000 mg via ORAL

## 2020-12-01 MED ORDER — OXYCODONE HCL 5 MG/5ML PO SOLN: 5.0000 mg | Freq: Once | ORAL | Status: DC | PRN

## 2020-12-01 MED ORDER — CEFAZOLIN SODIUM-DEXTROSE 2-4 GM/100ML-% IV SOLN
INTRAVENOUS | Status: AC
  Filled 2020-12-01: qty 100

## 2020-12-01 MED ORDER — KETOROLAC TROMETHAMINE 15 MG/ML IJ SOLN
15.0000 mg | INTRAMUSCULAR | Status: AC
  Administered 2020-12-01: 15 mg via INTRAVENOUS

## 2020-12-01 MED FILL — traMADol HCL 50 MG TABS: 50 | 2 days supply | Qty: 10 | Fill #0

## 2020-12-01 SURGICAL SUPPLY — 59 items
ADH SKN CLS APL DERMABOND .7 (GAUZE/BANDAGES/DRESSINGS) ×1
APL PRP STRL LF DISP 70% ISPRP (MISCELLANEOUS) ×1
APPLIER CLIP 9.375 MED OPEN (MISCELLANEOUS) ×2
APR CLP MED 9.3 20 MLT OPN (MISCELLANEOUS) ×1
BINDER BREAST LRG (GAUZE/BANDAGES/DRESSINGS) ×1 IMPLANT
BINDER BREAST MEDIUM (GAUZE/BANDAGES/DRESSINGS) IMPLANT
BINDER BREAST XLRG (GAUZE/BANDAGES/DRESSINGS) IMPLANT
BINDER BREAST XXLRG (GAUZE/BANDAGES/DRESSINGS) IMPLANT
BLADE SURG 15 STRL LF DISP TIS (BLADE) ×1 IMPLANT
BLADE SURG 15 STRL SS (BLADE) ×2
CANISTER SUC SOCK COL 7IN (MISCELLANEOUS) IMPLANT
CANISTER SUCT 1200ML W/VALVE (MISCELLANEOUS) IMPLANT
CHLORAPREP W/TINT 26 (MISCELLANEOUS) ×2 IMPLANT
CLIP APPLIE 9.375 MED OPEN (MISCELLANEOUS) IMPLANT
CLIP VESOCCLUDE SM WIDE 6/CT (CLIP) ×1 IMPLANT
COVER BACK TABLE 60X90IN (DRAPES) ×2 IMPLANT
COVER MAYO STAND STRL (DRAPES) ×2 IMPLANT
COVER PROBE W GEL 5X96 (DRAPES) ×2 IMPLANT
COVER WAND RF STERILE (DRAPES) IMPLANT
DECANTER SPIKE VIAL GLASS SM (MISCELLANEOUS) IMPLANT
DERMABOND ADVANCED (GAUZE/BANDAGES/DRESSINGS) ×1
DERMABOND ADVANCED .7 DNX12 (GAUZE/BANDAGES/DRESSINGS) ×1 IMPLANT
DRAPE LAPAROSCOPIC ABDOMINAL (DRAPES) ×2 IMPLANT
DRAPE UTILITY XL STRL (DRAPES) ×2 IMPLANT
ELECT COATED BLADE 2.86 ST (ELECTRODE) ×2 IMPLANT
ELECT REM PT RETURN 9FT ADLT (ELECTROSURGICAL) ×2
ELECTRODE REM PT RTRN 9FT ADLT (ELECTROSURGICAL) ×1 IMPLANT
GLOVE BIOGEL PI IND STRL 7.5 (GLOVE) ×1 IMPLANT
GLOVE BIOGEL PI INDICATOR 7.5 (GLOVE) ×1
GLOVE SURG ENC MOIS LTX SZ7 (GLOVE) ×4 IMPLANT
GOWN STRL REUS W/ TWL LRG LVL3 (GOWN DISPOSABLE) ×2 IMPLANT
GOWN STRL REUS W/TWL LRG LVL3 (GOWN DISPOSABLE) ×4
HEMOSTAT ARISTA ABSORB 3G PWDR (HEMOSTASIS) IMPLANT
KIT MARKER MARGIN INK (KITS) ×2 IMPLANT
NDL HYPO 25X1 1.5 SAFETY (NEEDLE) ×1 IMPLANT
NDL SAFETY ECLIPSE 18X1.5 (NEEDLE) IMPLANT
NEEDLE HYPO 18GX1.5 SHARP (NEEDLE)
NEEDLE HYPO 25X1 1.5 SAFETY (NEEDLE) ×2 IMPLANT
NS IRRIG 1000ML POUR BTL (IV SOLUTION) IMPLANT
PACK BASIN DAY SURGERY FS (CUSTOM PROCEDURE TRAY) ×2 IMPLANT
PENCIL SMOKE EVACUATOR (MISCELLANEOUS) ×2 IMPLANT
RETRACTOR ONETRAX LX 90X20 (MISCELLANEOUS) ×1 IMPLANT
SLEEVE SCD COMPRESS KNEE MED (MISCELLANEOUS) ×2 IMPLANT
SPONGE LAP 4X18 RFD (DISPOSABLE) ×3 IMPLANT
STRIP CLOSURE SKIN 1/2X4 (GAUZE/BANDAGES/DRESSINGS) ×2 IMPLANT
SUT ETHILON 2 0 FS 18 (SUTURE) IMPLANT
SUT MNCRL AB 4-0 PS2 18 (SUTURE) ×2 IMPLANT
SUT MON AB 5-0 PS2 18 (SUTURE) IMPLANT
SUT SILK 2 0 SH (SUTURE) ×1 IMPLANT
SUT VIC AB 2-0 SH 27 (SUTURE) ×8
SUT VIC AB 2-0 SH 27XBRD (SUTURE) ×1 IMPLANT
SUT VIC AB 3-0 SH 27 (SUTURE) ×2
SUT VIC AB 3-0 SH 27X BRD (SUTURE) ×1 IMPLANT
SUT VIC AB 5-0 PS2 18 (SUTURE) IMPLANT
SYR CONTROL 10ML LL (SYRINGE) ×2 IMPLANT
TOWEL GREEN STERILE FF (TOWEL DISPOSABLE) ×2 IMPLANT
TRAY FAXITRON CT DISP (TRAY / TRAY PROCEDURE) ×2 IMPLANT
TUBE CONNECTING 20X1/4 (TUBING) IMPLANT
YANKAUER SUCT BULB TIP NO VENT (SUCTIONS) IMPLANT

## 2020-12-01 NOTE — H&P (Signed)
45 yof who has no prior breast history presents with new breast cancer. she has no mass or dc. she has fh in maternal great aunt age 43, mom lung ca (smoker) and a rcc. she underwent screening mm that shows d density breasts. there is right uoq mass measuring 1.4 cm. US shows a 1.3x1x1.7 cm mass with a negative axilla. 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 she is here with her husband to discuss options she is former Marine scientist with ct surgery and now works in [place] with [employer]  Past Surgical History Mallie Snooks, [place]; 11/04/2020 10:59 AM) Breast Biopsy  Right. Nephrectomy  Right.  Diagnostic Studies History Mallie Snooks, [place]; 11/04/2020 10:59 AM) Mammogram  within last year Pap Smear  1-5 years ago  Allergies Mallie Snooks, CMA; 11/04/2020 10:59 AM) No Known Drug Allergies  [11/04/2020]: Allergies Reconciled   Medication History Mallie Snooks, CMA; 11/04/2020 10:59 AM) hydroCHLOROthiazide (25MG  Tablet, Oral) Active. Medications Reconciled  Social History Mallie Snooks, [place]; 11/04/2020 10:59 AM) Caffeine use  Carbonated beverages. No alcohol use  No drug use  Tobacco use  Never smoker.  Family History Mallie Snooks, [place]; 11/04/2020 10:59 AM) Depression  Mother. Diabetes Mellitus  Father. Heart Disease  Father. Kidney Disease  Father. Prostate Cancer  Father. Respiratory Condition  Mother.  Pregnancy / Birth History Mallie Snooks, [place]; 11/04/2020 10:59 AM) Age at menarche  37 years. Contraceptive History  Intrauterine device. Gravida  2 Irregular periods  Length (months) of breastfeeding  7-12 Maternal age  57-30 Para  2  Other Problems Mallie Snooks, [place]; 11/04/2020 10:59 AM) Breast Cancer  High blood pressure  Lump In Breast  Melanoma   Review of Systems Mallie Snooks CMA; 11/04/2020 10:59 AM) General Not Present- Appetite Loss, Chills, Fatigue, Fever,  Night Sweats, Weight Gain and Weight Loss. Skin Not Present- Change in Wart/Mole, Dryness, Hives, Jaundice, New Lesions, Non-Healing Wounds, Rash and Ulcer. HEENT Not Present- Earache, Hearing Loss, Hoarseness, Nose Bleed, Oral Ulcers, Ringing in the Ears, Seasonal Allergies, Sinus Pain, Sore Throat, Visual Disturbances, Wears glasses/contact lenses and Yellow Eyes. Respiratory Not Present- Bloody sputum, Chronic Cough, Difficulty Breathing, Snoring and Wheezing. Breast Present- Breast Mass. Not Present- Breast Pain, Nipple Discharge and Skin Changes. Cardiovascular Not Present- Chest Pain, Difficulty Breathing Lying Down, Leg Cramps, Palpitations, Rapid Heart Rate, Shortness of Breath and Swelling of Extremities. Gastrointestinal Not Present- Abdominal Pain, Bloating, Bloody Stool, Change in Bowel Habits, Chronic diarrhea, Constipation, Difficulty Swallowing, Excessive gas, Gets full quickly at meals, Hemorrhoids, Indigestion, Nausea, Rectal Pain and Vomiting. Female Genitourinary Not Present- Frequency, Nocturia, Painful Urination, Pelvic Pain and Urgency. Musculoskeletal Not Present- Back Pain, Joint Pain, Joint Stiffness, Muscle Pain, Muscle Weakness and Swelling of Extremities. Neurological Not Present- Decreased Memory, Fainting, Headaches, Numbness, Seizures, Tingling, Tremor, Trouble walking and Weakness. Psychiatric Not Present- Anxiety, Bipolar, Change in Sleep Pattern, Depression, Fearful and Frequent crying. Endocrine Not Present- Cold Intolerance, Excessive Hunger, Hair Changes, Heat Intolerance, Hot flashes and New Diabetes. Hematology Not Present- Blood Thinners, Easy Bruising, Excessive bleeding, Gland problems, HIV and Persistent Infections.  Vitals Mallie Snooks CMA; 11/04/2020 11:00 AM) 11/04/2020 10:59 AM Weight: 145.38 lb Height: 65in Body Surface Area: 1.73 m Body Mass Index: 24.19 kg/m  Temp.: 98.72F  Pulse: 92 (Regular)  P.OX: 99% (Room air) BP:  122/82(Sitting, Left Arm, Standard) Physical Exam Rolm Bookbinder MD; 11/07/2020 7:52 PM) General Mental Status-Alert. Orientation-Oriented X3. Breast Nipples-No Discharge. Breast Lump-No Palpable  Breast Mass. Lymphatic Head & Neck General Head & Neck Lymphatics: Bilateral - Description - Normal. Axillary General Axillary Region: Bilateral - Description - Normal. Note: no [place] adenopathy   Assessment & Plan Emelia Loron MD; 11/07/2020 7:56 PM) BREAST CANCER OF UPPER-OUTER QUADRANT OF RIGHT FEMALE BREAST (C50.411) right breast seed guided lumpectomy, right ax sn biopsy We discussed the staging and pathophysiology of breast cancer. We discussed all of the different options for treatment for breast cancer including surgery, chemotherapy, radiation therapy, Herceptin, and antiestrogen therapy. We discussed a sentinel lymph node biopsy as she does not appear to having lymph node involvement right now. We discussed the performance of that with injection of radioactive tracer. We discussed up to a 5% risk lifetime of chronic shoulder pain as well as lymphedema associated with a sentinel lymph node biopsy. We discussed the options for treatment of the breast cancer which included lumpectomy versus a mastectomy. We discussed the performance of the lumpectomy with radioactive seed placement. We discussed a 5-10% chance of a positive margin requiring reexcision in the operating room. We also discussed that she will need radiation therapy if she undergoes lumpectomy. We discussed mastectomy and the postoperative care for that as well. Mastectomy can be followed by reconstruction. The decision for lumpectomy vs mastectomy has no impact on decision for chemotherapy. Most mastectomy patients will not need radiation therapy. We discussed that there is no difference in her survival whether she undergoes lumpectomy with radiation therapy or antiestrogen therapy versus a mastectomy. There is also  no real difference between her recurrence in the breast. We discussed oncotype post surgery for chemo decision.  We discussed the risks of operation including bleeding, infection, possible reoperation. She understands her further therapy will be based on what her stages at the time of her operation.

## 2020-12-01 NOTE — Op Note (Signed)
Preoperative diagnosis: Clinical stage I right breast cancer Postoperative diagnosis: Same as above Procedure: 1.  Right breast radioactive seed guided lumpectomy 2.  Right deep axillary sentinel lymph node biopsy Surgeon: Dr. Harden Mo Anesthesia: General with a pectoral block Estimated blood loss: 30 cc Specimens: 1.  Right breast tissue marked with paint 2.  Right deep axillary sentinel lymph nodes with highest count of 869 3.  Additional superior and lateral margins right breast short superior, long lateral, double deep Complications: None Drains: None Special count was correct Disposition recovery stable condition  Indications:44 yof who has no prior breast history presents with new breast cancer. . she underwent screening mm that shows d density breasts. there is right uoq mass measuring 1.4 cm. US shows a 1.3x1x1.7 cm mass with a negative axilla. 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 MRI confirmed these findings and we elected to proceed with a seed guided lumpectomy as well as a sentinel lymph node biopsy.  Procedure: After informed consent was obtained the patient was given IV antibiotics.  SCDs were placed.  She underwent a pectoral block by anesthesia.  She was then placed under general anesthesia without complication.  She was prepped and draped in the standard sterile surgical fashion.  Surgical timeout was then performed.  She had the radioactive seed placed prior to beginning.  I had these mammograms available for review in the operating room.  This was in the very upper outer right breast.  I elected to make a curvilinear incision in the upper outer right breast and used this to do the entire procedure.  I infiltrated Marcaine in the skin I made the curvilinear incision.  I then used the neoprobe to dissect to the seed.  I then remove the seed and the surrounding tissue with an attempt to get a clear margin.   The posterior margin is now the muscle.  I confirmed removal of the seed and the clip with a mammogram.  I then removed additional lateral and superior margins as these look close to my 3D images.  I then packed this with a sponge.  I then through the same incision and entered into the axilla.  I was able to identify what appeared to be 2 axillary sentinel lymph nodes.  The counts are listed as above.  I remove these and passed these off as a specimen.  I then reexamined the axilla and there was no background radioactivity.  Hemostasis was observed.  I closed the axilla with 2-0 Vicryl suture.  I then obtained hemostasis in the lumpectomy cavity.  I mobilized the breast tissue.  I placed clips in the cavity for radiation later.  I then closed the breast tissue with 2-0 Vicryl.  The skin was closed with 3-0 Vicryl for Monocryl.  Glue and Steri-Strips were applied.  A binder was placed.  She tolerated this well was extubated and transferred to recovery in stable condition.

## 2020-12-01 NOTE — Anesthesia Preprocedure Evaluation (Signed)
Anesthesia Evaluation  Patient identified by MRN, date of birth, ID band Patient awake    Reviewed: Allergy & Precautions, NPO status , Patient's Chart, lab work & pertinent test results  Airway Mallampati: II  TM Distance: >3 FB Neck ROM: Full    Dental  (+) Teeth Intact, Dental Advisory Given   Pulmonary    breath sounds clear to auscultation       Cardiovascular hypertension,  Rhythm:Regular Rate:Normal     Neuro/Psych    GI/Hepatic   Endo/Other    Renal/GU      Musculoskeletal   Abdominal   Peds  Hematology   Anesthesia Other Findings   Reproductive/Obstetrics                             Anesthesia Physical Anesthesia Plan  ASA: I  Anesthesia Plan: General and Regional   Post-op Pain Management:    Induction: Intravenous  PONV Risk Score and Plan: Ondansetron and Dexamethasone  Airway Management Planned: LMA  Additional Equipment:   Intra-op Plan:   Post-operative Plan:   Informed Consent: I have reviewed the patients History and Physical, chart, labs and discussed the procedure including the risks, benefits and alternatives for the proposed anesthesia with the patient or authorized representative who has indicated his/her understanding and acceptance.     Dental advisory given  Plan Discussed with: CRNA and Anesthesiologist  Anesthesia Plan Comments:         Anesthesia Quick Evaluation

## 2020-12-01 NOTE — Transfer of Care (Signed)
Immediate Anesthesia Transfer of Care Note  Patient: Destiny Mitchell  Procedure(s) Performed: RIGHT BREAST LUMPECTOMY WITH RADIOACTIVE SEED AND RIGHT AXILLARY SENTINEL LYMPH NODE BIOPSY (Right Breast)  Patient Location: PACU  Anesthesia Type:General  Level of Consciousness: drowsy, patient cooperative and responds to stimulation  Airway & Oxygen Therapy: Patient Spontanous Breathing and Patient connected to face mask oxygen  Post-op Assessment: Report given to RN and Post -op Vital signs reviewed and stable  Post vital signs: Reviewed and stable  Last Vitals:  Vitals Value Taken Time  BP 126/83 12/01/20 1058  Temp    Pulse 86 12/01/20 1059  Resp 12 12/01/20 1059  SpO2 100 % 12/01/20 1059  Vitals shown include unvalidated device data.  Last Pain:  Vitals:   12/01/20 0737  TempSrc: Oral  PainSc: 0-No pain         Complications: No complications documented.

## 2020-12-01 NOTE — Interval H&P Note (Signed)
History and Physical Interval Note:  12/01/2020 9:24 AM  [name]  has presented today for surgery, with the diagnosis of RIGHT BREAST CANCER.  The various methods of treatment have been discussed with the patient and family. After consideration of risks, benefits and other options for treatment, the patient has consented to  Procedure(s) with comments: RIGHT BREAST LUMPECTOMY WITH RADIOACTIVE SEED AND RIGHT AXILLARY SENTINEL LYMPH NODE BIOPSY (Right) - PEC BLOCK as a surgical intervention.  The patient's history has been reviewed, patient examined, no change in status, stable for surgery.  I have reviewed the patient's chart and labs.  Questions were answered to the patient's satisfaction.     Destiny Mitchell

## 2020-12-01 NOTE — Progress Notes (Signed)
Assisted Dr. Joslin with right, ultrasound guided, pectoralis block. Side rails up, monitors on throughout procedure. See vital signs in flow sheet. Tolerated Procedure well. 

## 2020-12-01 NOTE — Anesthesia Procedure Notes (Signed)
Procedure Name: LMA Insertion Date/Time: 12/01/2020 9:45 AM Performed by: Thornell Mule, CRNA Pre-anesthesia Checklist: Patient identified, Emergency Drugs available, Suction available and Patient being monitored Patient Re-evaluated:Patient Re-evaluated prior to induction Oxygen Delivery Method: Circle system utilized Preoxygenation: Pre-oxygenation with 100% oxygen Induction Type: IV induction Ventilation: Mask ventilation without difficulty LMA: LMA inserted LMA Size: 4.0 Number of attempts: 1 Placement Confirmation: positive ETCO2 Tube secured with: Tape Dental Injury: Teeth and Oropharynx as per pre-operative assessment

## 2020-12-01 NOTE — Discharge Instructions (Signed)
Post Anesthesia Home Care Instructions  Activity: Get plenty of rest for the remainder of the day. A responsible individual must stay with you for 24 hours following the procedure.  For the next 24 hours, DO NOT: -Drive a car -Advertising copywriter -Drink alcoholic beverages -Take any medication unless instructed by your physician -Make any legal decisions or sign important papers.  Meals: Start with liquid foods such as gelatin or soup. Progress to regular foods as tolerated. Avoid greasy, spicy, heavy foods. If nausea and/or vomiting occur, drink only clear liquids until the nausea and/or vomiting subsides. Call your physician if vomiting continues.  Special Instructions/Symptoms: Your throat may feel dry or sore from the anesthesia or the breathing tube placed in your throat during surgery. If this causes discomfort, gargle with warm salt water. The discomfort should disappear within 24 hours.  If you had a scopolamine patch placed behind your ear for the management of post- operative nausea and/or vomiting:  1. The medication in the patch is effective for 72 hours, after which it should be removed.  Wrap patch in a tissue and discard in the trash. Wash hands thoroughly with soap and water. 2. You may remove the patch earlier than 72 hours if you experience unpleasant side effects which may include dry mouth, dizziness or visual disturbances. 3. Avoid touching the patch. Wash your hands with soap and water after contact with the patch.    Next dose of Tylenol may be given at [facility] Office Phone Number [phone]  BREAST BIOPSY/ PARTIAL MASTECTOMY: POST OP INSTRUCTIONS Take 400 mg of ibuprofen every 8 hours or 650 mg tylenol every 6 hours for next 72 hours then as needed. Use ice several times daily also. Always review your discharge instruction sheet given to you by the facility where your surgery was performed.  IF YOU HAVE DISABILITY OR FAMILY LEAVE  FORMS, YOU MUST BRING THEM TO THE OFFICE FOR PROCESSING.  DO NOT GIVE THEM TO YOUR DOCTOR.  1. A prescription for pain medication may be given to you upon discharge.  Take your pain medication as prescribed, if needed.  If narcotic pain medicine is not needed, then you may take acetaminophen (Tylenol), naprosyn (Alleve) or ibuprofen (Advil) as needed. 2. Take your usually prescribed medications unless otherwise directed 3. If you need a refill on your pain medication, please contact your pharmacy.  They will contact our office to request authorization.  Prescriptions will not be filled after 5pm or on week-ends. 4. You should eat very light the first 24 hours after surgery, such as soup, crackers, pudding, etc.  Resume your normal diet the day after surgery. 5. Most patients will experience some swelling and bruising in the breast.  Ice packs and a good support bra will help.  Wear the breast binder provided or a sports bra for 72 hours day and night.  After that wear a sports bra during the day until you return to the office. Swelling and bruising can take several days to resolve.  6. It is common to experience some constipation if taking pain medication after surgery.  Increasing fluid intake and taking a stool softener will usually help or prevent this problem from occurring.  A mild laxative (Milk of Magnesia or Miralax) should be taken according to package directions if there are no bowel movements after 48 hours. 7. Unless discharge instructions indicate otherwise, you may remove your bandages 48 hours after surgery and you may shower at that time.  You may have steri-strips (small skin tapes) in place directly over the incision.  These strips should be left on the skin for 7-10 days and will come off on their own.  If your surgeon used skin glue on the incision, you may shower in 24 hours.  The glue will flake off over the next 2-3 weeks.  Any sutures or staples will be removed at the office during  your follow-up visit. 8. ACTIVITIES:  You may resume regular daily activities (gradually increasing) beginning the next day.  Wearing a good support bra or sports bra minimizes pain and swelling.  You may have sexual intercourse when it is comfortable. a. You may drive when you no longer are taking prescription pain medication, you can comfortably wear a seatbelt, and you can safely maneuver your car and apply brakes. b. RETURN TO WORK:  ______________________________________________________________________________________ 9. You should see your doctor in the office for a follow-up appointment approximately two weeks after your surgery.  Your doctor's nurse will typically make your follow-up appointment when she calls you with your pathology report.  Expect your pathology report 3-4 business days after your surgery.  You may call to check if you do not hear from [place] after three days. 10. OTHER INSTRUCTIONS: _______________________________________________________________________________________________ _____________________________________________________________________________________________________________________________________ _____________________________________________________________________________________________________________________________________ _____________________________________________________________________________________________________________________________________  WHEN TO CALL DR WAKEFIELD: 1. Fever over 101.0 2. Nausea and/or vomiting. 3. Extreme swelling or bruising. 4. Continued bleeding from incision. 5. Increased pain, redness, or drainage from the incision.  The clinic staff is available to answer your questions during regular business hours.  Please don't hesitate to call and ask to speak to one of the nurses for clinical concerns.  If you have a medical emergency, go to the nearest emergency room or call 911.  A surgeon from [facility] is always  on call at the hospital.  For further questions, please visit [email or web address]

## 2020-12-01 NOTE — Anesthesia Procedure Notes (Addendum)
Anesthesia Regional Block: Pectoralis block   Pre-Anesthetic Checklist: ,, timeout performed, Correct Patient, Correct Site, Correct Laterality, Correct Procedure, Correct Position, site marked, Risks and benefits discussed,  Surgical consent,  Pre-op evaluation,  At surgeon's request and post-op pain management  Laterality: Right  Prep: chloraprep       Needles:  Injection technique: Single-shot  Needle Type: Echogenic Needle     Needle Length: 9cm  Needle Gauge: 21     Additional Needles:   Narrative:  Start time: 12/01/2020 8:00 AM End time: 12/01/2020 8:10 AM Injection made incrementally with aspirations every 5 mL.  Performed by: Personally  Anesthesiologist: Kipp Brood, MD  Additional Notes: 30 cc 0.25% Bupivacaine 1:200 epi injected easily

## 2020-12-01 NOTE — Anesthesia Postprocedure Evaluation (Signed)
Anesthesia Post Note  Patient: Destiny Mitchell  Procedure(s) Performed: RIGHT BREAST LUMPECTOMY WITH RADIOACTIVE SEED AND RIGHT AXILLARY SENTINEL LYMPH NODE BIOPSY (Right Breast)     Patient location during evaluation: PACU Anesthesia Type: Regional Level of consciousness: awake and alert Pain management: pain level controlled Vital Signs Assessment: post-procedure vital signs reviewed and stable Respiratory status: spontaneous breathing, nonlabored ventilation, respiratory function stable and patient connected to nasal cannula oxygen Cardiovascular status: blood pressure returned to baseline and stable Postop Assessment: no apparent nausea or vomiting Anesthetic complications: no   No complications documented.  Last Vitals:  Vitals:   12/01/20 1120 12/01/20 1128  BP:  135/85  Pulse: 84 75  Resp: (!) 24 18  Temp:  36.6 C  SpO2: 100% 100%    Last Pain:  Vitals:   12/01/20 1128  TempSrc:   PainSc: 4                  [name] COKER

## 2020-12-02 ENCOUNTER — Encounter (HOSPITAL_BASED_OUTPATIENT_CLINIC_OR_DEPARTMENT_OTHER): Payer: Self-pay | Admitting: General Surgery

## 2020-12-05 LAB — SURGICAL PATHOLOGY

## 2020-12-07 ENCOUNTER — Telehealth: Payer: Self-pay | Admitting: *Deleted

## 2020-12-07 ENCOUNTER — Encounter: Payer: Self-pay | Admitting: *Deleted

## 2020-12-07 NOTE — Telephone Encounter (Signed)
Received order for oncotype testing. Requisition faxed to pathology and [facility] °

## 2020-12-12 ENCOUNTER — Encounter: Payer: Self-pay | Admitting: *Deleted

## 2020-12-12 ENCOUNTER — Other Ambulatory Visit (HOSPITAL_COMMUNITY): Payer: BLUE CROSS/BLUE SHIELD

## 2020-12-12 ENCOUNTER — Telehealth: Payer: Self-pay | Admitting: *Deleted

## 2020-12-12 NOTE — Telephone Encounter (Signed)
Pt will be seen after oncotype score results have returned. R/s to 1/31 at 10:15. Confirmed appt. No further needs voiced

## 2020-12-13 ENCOUNTER — Ambulatory Visit: Payer: BLUE CROSS/BLUE SHIELD | Admitting: Hematology and Oncology

## 2020-12-16 ENCOUNTER — Encounter: Payer: Self-pay | Admitting: Hematology and Oncology

## 2020-12-16 ENCOUNTER — Encounter: Payer: Self-pay | Admitting: *Deleted

## 2020-12-16 ENCOUNTER — Telehealth: Payer: Self-pay | Admitting: *Deleted

## 2020-12-16 DIAGNOSIS — C50411 Malignant neoplasm of upper-outer quadrant of right female breast: Secondary | ICD-10-CM

## 2020-12-16 NOTE — Telephone Encounter (Signed)
Received oncotype score of 5. Physician team notified. Called pt with results. Discussed chemo not recommended. Informed next step is xrt with Dr. Sondra Come. Referral placed No further needs or questions voiced.

## 2020-12-19 ENCOUNTER — Other Ambulatory Visit: Payer: Self-pay

## 2020-12-19 ENCOUNTER — Encounter: Payer: Self-pay | Admitting: Physical Therapy

## 2020-12-19 ENCOUNTER — Ambulatory Visit: Payer: BLUE CROSS/BLUE SHIELD | Attending: General Surgery | Admitting: Physical Therapy

## 2020-12-19 DIAGNOSIS — Z170 Estrogen receptor positive status [ER+]: Secondary | ICD-10-CM | POA: Diagnosis present

## 2020-12-19 DIAGNOSIS — Z483 Aftercare following surgery for neoplasm: Secondary | ICD-10-CM | POA: Diagnosis present

## 2020-12-19 DIAGNOSIS — C50411 Malignant neoplasm of upper-outer quadrant of right female breast: Secondary | ICD-10-CM | POA: Diagnosis present

## 2020-12-19 DIAGNOSIS — R293 Abnormal posture: Secondary | ICD-10-CM | POA: Insufficient documentation

## 2020-12-19 DIAGNOSIS — M25611 Stiffness of right shoulder, not elsewhere classified: Secondary | ICD-10-CM | POA: Insufficient documentation

## 2020-12-19 NOTE — Therapy (Signed)
[facility] [address] [place], [place], [address] Phone: [phone]   Fax:  [phone]  Physical Therapy Treatment  Patient Details  Name: Destiny Mitchell MRN: [id number] Date of Birth: 03/18/1976 Referring Provider (PT): Dr. Matthew Wakefield   Encounter Date: 12/19/2020   PT End of Session - 12/19/20 1040    Visit Number 2    Number of Visits 10    Date for PT Re-Evaluation 01/16/21    PT Start Time 1002    PT Stop Time 1100    PT Time Calculation (min) 58 min    Activity Tolerance Patient tolerated treatment well    Behavior During Therapy WFL for tasks assessed/performed           Past Medical History:  Diagnosis Date  . Anxiety attack   . Back pain   . Chronic kidney disease   . Family history of breast cancer   . Family history of colon cancer   . Family history of kidney cancer   . Family history of lung cancer   . Family history of pancreatic cancer   . Family history of prostate cancer   . Family history of stomach cancer   . Palpitations   . Pre-eclampsia, postpartum   . Sinus tachycardia   . SVD (spontaneous vaginal delivery) 06/22/2014  . Syncope and collapse   . Ureter obstruction    s/p surgery in 2009    Past Surgical History:  Procedure Laterality Date  . BREAST LUMPECTOMY WITH RADIOACTIVE SEED AND SENTINEL LYMPH NODE BIOPSY Right 12/01/2020   Procedure: RIGHT BREAST LUMPECTOMY WITH RADIOACTIVE SEED AND RIGHT AXILLARY SENTINEL LYMPH NODE BIOPSY;  Surgeon: Wakefield, Matthew, MD;  Location: [facility];  Service: General;  Laterality: Right;  PEC BLOCK  . 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 US ECHOCARDIOGRAPHY  01/01/2005   EF 55-60%. NORMAL  . WISDOM TOOTH EXTRACTION      There were no vitals filed for this visit.   Subjective Assessment - 12/19/20 1009    Subjective Patient underwent a right lumpectomy and sentinel node biopsy (2 negative  nodes) on 12/01/2020. She has had problems with breast edema and saw the PA last week but did not drain the fluid. Her Oncotype was very low (5) so she will begin radiation.    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 Patient underwent a right lumpectomy and sentinel node biopsy (2 negative nodes) on 12/01/2020. It is ER/PR positive and HER2 negative with a Ki67 of 10%.    Patient Stated Goals See if my arm is doing ok    Currently in Pain? Yes    Pain Score 2     Pain Location Axilla    Pain Orientation Right    Pain Descriptors / Indicators Tightness    Pain Type Surgical pain    Pain Onset 1 to 4 weeks ago    Pain Frequency Intermittent    Aggravating Factors  Reaching overhead    Pain Relieving Factors Rest              [facility] PT Assessment - 12/19/20 0001      Assessment   Medical Diagnosis s/p right lumpecotomy and SLNB    Referring Provider (PT) Dr. Matthew Wakefield    Onset Date/Surgical Date 12/01/20    Hand Dominance Right    Prior Therapy Baselines        Precautions   Precautions Other (comment)    Precaution Comments recent surgery; right arm lymphedema      Restrictions   Weight Bearing Restrictions No      Balance Screen   Has the patient fallen in the past 6 months No    Has the patient had a decrease in activity level because of a fear of falling?  No    Is the patient reluctant to leave their home because of a fear of falling?  No      Home Environment   Living Environment Private residence    Living Arrangements Spouse/significant other;Children   Husband, 53 and 41 y.o. 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 Edema present right lateral breast around incision. Incision appears to be healing well with steri-strips still in place.      Posture/Postural Control   Posture/Postural Control Postural limitations    Postural Limitations Rounded Shoulders      ROM / Strength   AROM / PROM / Strength AROM      AROM   AROM Assessment Site Shoulder    Right/Left Shoulder Right    Right Shoulder Extension 46 Degrees    Right Shoulder Flexion 125 Degrees    Right Shoulder ABduction 130 Degrees    Right Shoulder Internal Rotation 53 Degrees    Right Shoulder External Rotation 83 Degrees               LYMPHEDEMA/ONCOLOGY QUESTIONNAIRE - 12/19/20 0001      Type   Cancer Type Right breast cancer      Surgeries   Lumpectomy Date 12/01/20    Sentinel Lymph Node Biopsy Date 12/01/20    Number Lymph Nodes Removed 2      Treatment   Active Chemotherapy Treatment No    Past Chemotherapy Treatment No    Active Radiation Treatment No    Past Radiation Treatment No    Current Hormone Treatment No    Past Hormone Therapy No      What other symptoms do you have   Are you Having Heaviness or Tightness Yes    Are you having Pain Yes    Are you having pitting edema No    Is it Hard or Difficult finding clothes that fit No    Do you have infections No    Is there Decreased scar mobility Yes    Stemmer Sign No      Lymphedema Assessments   Lymphedema Assessments Upper extremities      Right Upper Extremity Lymphedema   10 cm Proximal to Olecranon Process 25.3 cm    Olecranon Process 24.3 cm    10 cm Proximal to Ulnar Styloid Process 22 cm    Just Proximal to Ulnar Styloid Process 15.9 cm    Across Hand at [employer] 18.9 cm    At [place] of 2nd Digit 6.3 cm      Left Upper Extremity Lymphedema   10 cm Proximal to Olecranon Process 25.4 cm  Olecranon Process  24.5 cm    10 cm Proximal to Ulnar Styloid Process 21.1 cm    Just Proximal to Ulnar Styloid Process 15.8 cm    Across Hand at Thumb Web Space 18.8 cm    At Base of 2nd Digit 6 cm              Quick Dash - 12/19/20 0001    Open a tight or new jar No difficulty    Do heavy household chores (wash walls, wash floors) No difficulty    Carry a shopping bag or briefcase No difficulty    Wash your back No difficulty    Use a knife to cut food No difficulty    Recreational activities in which you take some force or impact through your arm, shoulder, or hand (golf, hammering, tennis) Mild difficulty    During the past week, to what extent has your arm, shoulder or hand problem interfered with your normal social activities with family, friends, neighbors, or groups? Not at all    During the past week, to what extent has your arm, shoulder or hand problem limited your work or other regular daily activities Not at all    Arm, shoulder, or hand pain. Mild    Tingling (pins and needles) in your arm, shoulder, or hand Mild    Difficulty Sleeping No difficulty    DASH Score 6.82 %                  [facility] Adult PT Treatment/Exercise - 12/19/20 0001      Manual Therapy   Manual Therapy Myofascial release;Passive ROM;Neural Stretch    Myofascial Release To right axilla and medial upper arm where cording is present    Passive ROM To right shoulder all planes focused on flexion and abduction    Neural Stretch To right upper extremity in supine                  PT Education - 12/19/20 1039    Education Details Follow up care; scar massage, HEP    Person(s) Educated Patient    Methods Explanation;Demonstration;Handout    Comprehension Returned demonstration;Verbalized understanding               PT Long Term Goals - 12/19/20 1212      PT LONG TERM GOAL #1   Title Patient will demonstrate she has regained full shoulder ROM and function post operatively compared to  baselines.    Time 4    Period Weeks    Status On-going    Target Date 01/16/21      PT LONG TERM GOAL #2   Title Patient will increase right shoulder flexion to >/= 150 degrees for increased ease reaching.    Baseline 125 degrees    Time 4    Period Weeks    Status New    Target Date 01/16/21      PT LONG TERM GOAL #3   Title Patient will increase right shoulder abduction to >/= 160 degrees for increased ease reaching.    Baseline 130 degrees    Time 4    Period Weeks    Status New    Target Date 01/16/21      PT LONG TERM GOAL #4   Title Patient will report good understanding of lymphedema risk reduction practices.    Time 4    Period Weeks    Status New    Target Date 01/16/21        PT LONG TERM GOAL #5   Title Patient will report >/= 50% improvement in axillary cording with less restriction during end range abduction.    Time 4    Period Weeks    Status New    Target Date 01/16/21                 Plan - 12/19/20 1209    Clinical Impression Statement Patient is doing very well s/p right lumpectomy and sentinel node iopsy (1 negative node) on 12/01/2020. She has mild edema present right lateral breast and visible and palpable axillary cording (see photo taken). Shoulder ROM is lacking about 20 degrees of flexion and abduction but incision appears to be healing well and there is no sign of arm lymphedema. She will benefit from PT to improve shoulder ROM, decrease edema, and improve axillary cording.    PT Frequency 2x / week    PT Duration 4 weeks    PT Treatment/Interventions ADLs/Self Care Home Management;Therapeutic exercise;Patient/family education;Manual techniques;Manual lymph drainage;Passive range of motion;Scar mobilization    PT Next Visit Plan PROM right shoulder; myofascial release for cording; scar massage when steri-strips fall of    PT Home Exercise Plan Post op shoulder ROM HEP and closed chain flexion and abduction    Consulted and Agree with Plan of  Care Patient           Patient will benefit from skilled therapeutic intervention in order to improve the following deficits and impairments:  Postural dysfunction,Decreased range of motion,Decreased knowledge of precautions,Impaired UE functional use,Pain,Decreased scar mobility,Increased edema,Increased fascial restricitons  Visit Diagnosis: Malignant neoplasm of upper-outer quadrant of right breast in female, estrogen receptor positive ([place]) - Plan: PT plan of care cert/re-cert  Abnormal posture - Plan: PT plan of care cert/re-cert  Aftercare following surgery for neoplasm - Plan: PT plan of care cert/re-cert  Stiffness of right shoulder, not elsewhere classified - Plan: PT plan of care cert/re-cert     Problem List Patient Active Problem List   Diagnosis Date Noted  . Genetic testing 11/14/2020  . Malignant neoplasm of upper-outer quadrant of right breast in female, estrogen receptor positive ([place]) 11/04/2020  . Family history of prostate cancer   . Family history of lung cancer   . Family history of kidney cancer   . Family history of stomach cancer   . Family history of breast cancer   . Family history of colon cancer   . Family history of pancreatic cancer   . Dysuria 06/09/2019  . Vaginal discharge 06/09/2019  . Urinary urgency 06/09/2019  . Mallet finger of left hand 12/06/2017  . Pain of left hand 12/06/2017  . Normal labor 06/22/2014  . SVD (spontaneous vaginal delivery) 06/22/2014  . Heart palpitations 09/10/2011  . ACUTE MAXILLARY SINUSITIS 08/27/2009   Annia Friendly, PT 12/19/20 12:16 PM  [place], [place], [address] Phone: [phone]   Fax:  [phone]  Name: AREANA KOSANKE MRN: [id number] Date of Birth: 31-Aug-1976

## 2020-12-19 NOTE — Patient Instructions (Signed)
Axillary web syndrome (also called cording) can happen after having breast cancer surgery when lymph nodes in the armpit are removed. It presents as if you have a thin cord in your arm and can run from the armpit all the way down into the forearm. If you've had a sentinel node biopsy, the risk is 1-20% and if you've had an axillary lymph node dissection (more than 7 nodes removed), the risk is 36-72%. The ranges vary depending on the research study.  It most often happens 3-4 weeks post-op but can happen sooner or later. There are several possibilities for what cording actually is. Although no one knows for sure as of yet, it may be related to lymphatics, veins, or other tissue. Sometimes cording resolves on its own but other times it requires physical therapy with a therapist who specializes in lymphedema and/or cancer rehab. Treatment typically involves stretching, manual techniques, and exercise. Sometimes cords get "released" while stretching or during manual treatment and the patient may experience the sensation of a "pop." This may feel strange but it is not dangerous and is a sign that the cord has released; range of motion may be improved in the process.              [facility]         [address], [place] [address]         [phone]         Annia Friendly, PT, CLT   After Breast Cancer Class It is recommended you attend the ABC class to be educated on lymphedema risk reduction. This class is free of charge and lasts for 1 hour. It is a 1-time class.  You are scheduled for Feb. 7th at 11:00. We'll send you a link.  Scar massage You can begin gentle scar massage with coconut oil a few minutes a day - after steri-strips fall off.    Compression garment Continue wearing your sports bra until you don't have swelling.   Home exercise Program Continue exercises until you can reach fully overhead or out to the side without feeling  tightness.   Follow up PT: It is recommended you return every 3 months for the first 3 years following surgery to be assessed on the SOZO machine for an L-Dex score. This helps prevent clinically significant lymphedema in 95% of patients. These follow up screens are 15 minute appointments that you are not billed for. You're scheduled for 03/06/2021.  Closed Chain: Shoulder Flexion / Extension - on Wall    Hands on wall, step backward. Return. Stepping causes shoulder flexion and extension Do _2__ times, holding 5 seconds, __2_ times per day.  http://ss.exer.us/265   Copyright  VHI. All rights reserved.    Closed Chain: Shoulder Abduction / Adduction - on Wall    One hand on wall, step to side and return. Stepping causes shoulder to abduct and adduct. Step _5__ times,holding 5 seconds, _2__ times per day.  http://ss.exer.us/267   Copyright  VHI. All rights reserved.

## 2020-12-22 ENCOUNTER — Encounter: Payer: Self-pay | Admitting: Physical Therapy

## 2020-12-22 ENCOUNTER — Other Ambulatory Visit: Payer: Self-pay

## 2020-12-22 ENCOUNTER — Encounter: Payer: Self-pay | Admitting: *Deleted

## 2020-12-22 ENCOUNTER — Ambulatory Visit: Payer: BLUE CROSS/BLUE SHIELD

## 2020-12-22 DIAGNOSIS — R293 Abnormal posture: Secondary | ICD-10-CM

## 2020-12-22 DIAGNOSIS — C50411 Malignant neoplasm of upper-outer quadrant of right female breast: Secondary | ICD-10-CM

## 2020-12-22 DIAGNOSIS — M25611 Stiffness of right shoulder, not elsewhere classified: Secondary | ICD-10-CM

## 2020-12-22 DIAGNOSIS — Z483 Aftercare following surgery for neoplasm: Secondary | ICD-10-CM

## 2020-12-22 DIAGNOSIS — Z170 Estrogen receptor positive status [ER+]: Secondary | ICD-10-CM

## 2020-12-22 NOTE — Therapy (Signed)
[place], [place], [address] Phone: [phone]   Fax:  [phone]  Physical Therapy Treatment  Patient Details  Name: Destiny Mitchell MRN: [id number] Date of Birth: 27-Dec-1975 Referring Provider (PT): Dr. Rolm Bookbinder   Encounter Date: 12/22/2020   PT End of Session - 12/22/20 1215    Visit Number 3    Number of Visits 10    Date for PT Re-Evaluation 01/16/21    PT Start Time 1108    PT Stop Time 1204    PT Time Calculation (min) 56 min    Activity Tolerance Patient tolerated treatment well    Behavior During Therapy [facility] for tasks assessed/performed           Past Medical History:  Diagnosis Date  . Anxiety attack   . Back pain   . Chronic kidney disease   . Family history of breast cancer   . Family history of colon cancer   . Family history of kidney cancer   . Family history of lung cancer   . Family history of pancreatic cancer   . Family history of prostate cancer   . Family history of stomach cancer   . Palpitations   . Pre-eclampsia, postpartum   . Sinus tachycardia   . SVD (spontaneous vaginal delivery) 06/22/2014  . Syncope and collapse   . Ureter obstruction    s/p surgery in 2009    Past Surgical History:  Procedure Laterality Date  . BREAST LUMPECTOMY WITH RADIOACTIVE SEED AND SENTINEL LYMPH NODE BIOPSY Right 12/01/2020   Procedure: RIGHT BREAST LUMPECTOMY WITH RADIOACTIVE SEED AND RIGHT AXILLARY SENTINEL LYMPH NODE BIOPSY;  Surgeon: Rolm Bookbinder, MD;  Location: [facility];  Service: General;  Laterality: Right;  PEC BLOCK  . 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 US ECHOCARDIOGRAPHY  01/01/2005   EF 55-60%. NORMAL  . WISDOM TOOTH EXTRACTION      There were no vitals filed for this visit.   Subjective Assessment - 12/22/20 1109    Subjective I was pretty sore after the session with Inez Catalina just from the stretching but I  couldn't find the cording to show my husband later so it may be better.    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 Patient underwent a right lumpectomy and sentinel node biopsy (2 negative nodes) on 12/01/2020. It is ER/PR positive and HER2 negative with a Ki67 of 10%.    Patient Stated Goals See if my arm is doing ok    Currently in Pain? No/denies                             [facility] Adult PT Treatment/Exercise - 12/22/20 0001      Manual Therapy   Manual Therapy Myofascial release;Passive ROM;Neural Stretch    Myofascial Release To right axilla and medial upper arm where cording is present    Passive ROM To right shoulder pts end flexion, abduction and D2    Neural Stretch --                       PT Long Term Goals - 12/19/20 1212      PT LONG TERM GOAL #1   Title Patient will demonstrate she has regained full shoulder ROM and function post operatively compared to baselines.  Time 4    Period Weeks    Status On-going    Target Date 01/16/21      PT LONG TERM GOAL #2   Title Patient will increase right shoulder flexion to >/= 150 degrees for increased ease reaching.    Baseline 125 degrees    Time 4    Period Weeks    Status New    Target Date 01/16/21      PT LONG TERM GOAL #3   Title Patient will increase right shoulder abduction to >/= 160 degrees for increased ease reaching.    Baseline 130 degrees    Time 4    Period Weeks    Status New    Target Date 01/16/21      PT LONG TERM GOAL #4   Title Patient will report good understanding of lymphedema risk reduction practices.    Time 4    Period Weeks    Status New    Target Date 01/16/21      PT LONG TERM GOAL #5   Title Patient will report >/= 50% improvement in axillary cording with less restriction during end range abduction.    Time 4    Period Weeks    Status New    Target Date 01/16/21                  Plan - 12/22/20 1216    Clinical Impression Statement Good improvement noted in coding from picture taken at last session. Cording is much less visible and less restricting as her P/ROM was near full by end of session. Continued with manual therapy working to further reduce cording. Pt reports feeling much looser at end of session.    Stability/Clinical Decision Making Stable/Uncomplicated    Rehab Potential Excellent    PT Frequency 2x / week    PT Duration 4 weeks    PT Treatment/Interventions ADLs/Self Care Home Management;Therapeutic exercise;Patient/family education;Manual techniques;Manual lymph drainage;Passive range of motion;Scar mobilization    PT Next Visit Plan PROM right shoulder; myofascial release for cording; scar massage when steri-strips fall off; add AA/ROM with pulleys and ball roll up wall    PT Home Exercise Plan Post op shoulder ROM HEP and closed chain flexion and abduction    Consulted and Agree with Plan of Care Patient           Patient will benefit from skilled therapeutic intervention in order to improve the following deficits and impairments:  Postural dysfunction,Decreased range of motion,Decreased knowledge of precautions,Impaired UE functional use,Pain,Decreased scar mobility,Increased edema,Increased fascial restricitons  Visit Diagnosis: Malignant neoplasm of upper-outer quadrant of right breast in female, estrogen receptor positive ([place])  Abnormal posture  Aftercare following surgery for neoplasm  Stiffness of right shoulder, not elsewhere classified     Problem List Patient Active Problem List   Diagnosis Date Noted  . Genetic testing 11/14/2020  . Malignant neoplasm of upper-outer quadrant of right breast in female, estrogen receptor positive ([place]) 11/04/2020  . Family history of prostate cancer   . Family history of lung cancer   . Family history of kidney cancer   . Family history of stomach cancer   . Family history of  breast cancer   . Family history of colon cancer   . Family history of pancreatic cancer   . Dysuria 06/09/2019  . Vaginal discharge 06/09/2019  . Urinary urgency 06/09/2019  . Mallet finger of left hand 12/06/2017  . Pain of left hand 12/06/2017  .  Normal labor 06/22/2014  . SVD (spontaneous vaginal delivery) 06/22/2014  . Heart palpitations 09/10/2011  . ACUTE MAXILLARY SINUSITIS 08/27/2009    Otelia Limes, PTA 12/22/2020, 12:19 PM  [place], [place], [address] Phone: [phone]   Fax:  [phone]  Name: DENIECE RANKIN MRN: [id number] Date of Birth: [birth year]

## 2020-12-23 ENCOUNTER — Other Ambulatory Visit (HOSPITAL_COMMUNITY): Payer: Self-pay | Admitting: Internal Medicine

## 2020-12-23 MED FILL — CEFDINIR 300 MG CAPS: 300 | 7 days supply | Qty: 14 | Fill #0

## 2020-12-23 MED FILL — BENZONATATE 200 MG CAP: 200 | 10 days supply | Qty: 30 | Fill #0

## 2020-12-26 ENCOUNTER — Other Ambulatory Visit: Payer: Self-pay

## 2020-12-26 ENCOUNTER — Ambulatory Visit: Payer: BLUE CROSS/BLUE SHIELD | Admitting: Hematology and Oncology

## 2020-12-26 ENCOUNTER — Ambulatory Visit: Payer: BLUE CROSS/BLUE SHIELD

## 2020-12-26 DIAGNOSIS — Z483 Aftercare following surgery for neoplasm: Secondary | ICD-10-CM

## 2020-12-26 DIAGNOSIS — C50411 Malignant neoplasm of upper-outer quadrant of right female breast: Secondary | ICD-10-CM | POA: Diagnosis not present

## 2020-12-26 DIAGNOSIS — M25611 Stiffness of right shoulder, not elsewhere classified: Secondary | ICD-10-CM

## 2020-12-26 DIAGNOSIS — Z170 Estrogen receptor positive status [ER+]: Secondary | ICD-10-CM

## 2020-12-26 DIAGNOSIS — R293 Abnormal posture: Secondary | ICD-10-CM

## 2020-12-26 NOTE — Therapy (Signed)
[place], [place], [address] Phone: [phone]   Fax:  [phone]  Physical Therapy Treatment  Patient Details  Name: Destiny Mitchell MRN: [id number] Date of Birth: 01-27-1976 Referring Provider (PT): Dr. Rolm Bookbinder   Encounter Date: 12/26/2020   PT End of Session - 12/26/20 1215    Visit Number 4    Number of Visits 10    Date for PT Re-Evaluation 01/16/21    PT Start Time 1107    PT Stop Time 1213    PT Time Calculation (min) 66 min    Activity Tolerance Patient tolerated treatment well    Behavior During Therapy [facility] for tasks assessed/performed           Past Medical History:  Diagnosis Date  . Anxiety attack   . Back pain   . Chronic kidney disease   . Family history of breast cancer   . Family history of colon cancer   . Family history of kidney cancer   . Family history of lung cancer   . Family history of pancreatic cancer   . Family history of prostate cancer   . Family history of stomach cancer   . Palpitations   . Pre-eclampsia, postpartum   . Sinus tachycardia   . SVD (spontaneous vaginal delivery) 06/22/2014  . Syncope and collapse   . Ureter obstruction    s/p surgery in 2009    Past Surgical History:  Procedure Laterality Date  . BREAST LUMPECTOMY WITH RADIOACTIVE SEED AND SENTINEL LYMPH NODE BIOPSY Right 12/01/2020   Procedure: RIGHT BREAST LUMPECTOMY WITH RADIOACTIVE SEED AND RIGHT AXILLARY SENTINEL LYMPH NODE BIOPSY;  Surgeon: Rolm Bookbinder, MD;  Location: [place];  Service: General;  Laterality: Right;  PEC BLOCK  . 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 US ECHOCARDIOGRAPHY  01/01/2005   EF 55-60%. NORMAL  . WISDOM TOOTH EXTRACTION      There were no vitals filed for this visit.   Subjective Assessment - 12/26/20 1113    Subjective I felt good after last visit, no increased soreness. I saw Dr. Donne Hazel and  he thought my seroma wa a little smaller so he didn't drain it and wants to see me to check it again on 01/04/21.    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 Patient underwent a right lumpectomy and sentinel node biopsy (2 negative nodes) on 12/01/2020. It is ER/PR positive and HER2 negative with a Ki67 of 10%.    Patient Stated Goals See if my arm is doing ok    Currently in Pain? No/denies                             [facility] Adult PT Treatment/Exercise - 12/26/20 0001      Shoulder Exercises: Pulleys   Flexion 2 minutes    Flexion Limitations Pt returned therapist demo and VCs to decrease Rt scapular compensation    ABduction 2 minutes    ABduction Limitations Pt returned therapist demo and VCs to decrease Lt trunk lean      Shoulder Exercises: Therapy Ball   Flexion Both;5 reps   forward lean into end of stretch   ABduction Right;5 reps   same side lean into end of stretch     Manual Therapy   Manual Therapy Myofascial release;Passive ROM;Manual Lymphatic  Drainage (MLD);Soft tissue mobilization    Soft tissue mobilization To Rt upper trap with coconut oil in Lt S/L and supine    Myofascial Release To right axilla, cording here is much improved from last week; also briefly to inferior breast where pt also reports feeling pull at end ROM    Manual Lymphatic Drainage (MLD) In Supine: short neck, 5 diaphragmatic breaths, Rt inguinal nodes and Rt axillo-inguinal anastomosis focusing on lateral breast briefly and beginning to instruct pt same    Passive ROM To right shoulder pts end flexion, abduction and D2; also into cervical Lt rotation and side bending working to decrease Lt upper trap tightness with trigger point release                       PT Long Term Goals - 12/19/20 1212      PT LONG TERM GOAL #1   Title Patient will demonstrate she has regained full shoulder ROM and function post  operatively compared to baselines.    Time 4    Period Weeks    Status On-going    Target Date 01/16/21      PT LONG TERM GOAL #2   Title Patient will increase right shoulder flexion to >/= 150 degrees for increased ease reaching.    Baseline 125 degrees    Time 4    Period Weeks    Status New    Target Date 01/16/21      PT LONG TERM GOAL #3   Title Patient will increase right shoulder abduction to >/= 160 degrees for increased ease reaching.    Baseline 130 degrees    Time 4    Period Weeks    Status New    Target Date 01/16/21      PT LONG TERM GOAL #4   Title Patient will report good understanding of lymphedema risk reduction practices.    Time 4    Period Weeks    Status New    Target Date 01/16/21      PT LONG TERM GOAL #5   Title Patient will report >/= 50% improvement in axillary cording with less restriction during end range abduction.    Time 4    Period Weeks    Status New    Target Date 01/16/21                 Plan - 12/26/20 1215    Clinical Impression Statement Continued with manual therapy working on decreasing cording at Rt axilla and progressing end P/ROM of Rt shoulder. Added STM to Rt upper trap where pt reprots tightness. Multiple trigger points palpated here with min softening noted after STM. Pt reports feelig some looser here after as well. Also added AA/ROM exs with pulleys and ball roll up wall. Pt reported feeling good stretches with these.    Stability/Clinical Decision Making Stable/Uncomplicated    Rehab Potential Excellent    PT Frequency 2x / week    PT Duration 4 weeks    PT Treatment/Interventions ADLs/Self Care Home Management;Therapeutic exercise;Patient/family education;Manual techniques;Manual lymph drainage;Passive range of motion;Scar mobilization    PT Next Visit Plan PROM right shoulder; myofascial release for cording; scar massage when steri-strips fall off; cont AA/ROM with pulleys and ball roll up wall    PT Home  Exercise Plan Post op shoulder ROM HEP and closed chain flexion and abduction    Consulted and Agree with Plan of Care Patient  Patient will benefit from skilled therapeutic intervention in order to improve the following deficits and impairments:  Postural dysfunction,Decreased range of motion,Decreased knowledge of precautions,Impaired UE functional use,Pain,Decreased scar mobility,Increased edema,Increased fascial restricitons  Visit Diagnosis: Malignant neoplasm of upper-outer quadrant of right breast in female, estrogen receptor positive ([place])  Abnormal posture  Aftercare following surgery for neoplasm  Stiffness of right shoulder, not elsewhere classified     Problem List Patient Active Problem List   Diagnosis Date Noted  . Genetic testing 11/14/2020  . Malignant neoplasm of upper-outer quadrant of right breast in female, estrogen receptor positive ([place]) 11/04/2020  . Family history of prostate cancer   . Family history of lung cancer   . Family history of kidney cancer   . Family history of stomach cancer   . Family history of breast cancer   . Family history of colon cancer   . Family history of pancreatic cancer   . Dysuria 06/09/2019  . Vaginal discharge 06/09/2019  . Urinary urgency 06/09/2019  . Mallet finger of left hand 12/06/2017  . Pain of left hand 12/06/2017  . Normal labor 06/22/2014  . SVD (spontaneous vaginal delivery) 06/22/2014  . Heart palpitations 09/10/2011  . ACUTE MAXILLARY SINUSITIS 08/27/2009    Otelia Limes, PTA 12/26/2020, 12:45 PM  [place], [place], [address] Phone: [phone]   Fax:  [phone]  Name: ELLIEMAE BRAMAN MRN: [id number] Date of Birth: 1976/06/27

## 2020-12-27 MED FILL — HYDROCHLOROTHIAZIDE 25 MG T: 25 | 30 days supply | Qty: 30 | Fill #5

## 2020-12-29 ENCOUNTER — Ambulatory Visit: Payer: BLUE CROSS/BLUE SHIELD | Attending: General Surgery

## 2020-12-29 ENCOUNTER — Other Ambulatory Visit: Payer: Self-pay

## 2020-12-29 DIAGNOSIS — M25611 Stiffness of right shoulder, not elsewhere classified: Secondary | ICD-10-CM | POA: Diagnosis present

## 2020-12-29 DIAGNOSIS — C50411 Malignant neoplasm of upper-outer quadrant of right female breast: Secondary | ICD-10-CM | POA: Insufficient documentation

## 2020-12-29 DIAGNOSIS — Z170 Estrogen receptor positive status [ER+]: Secondary | ICD-10-CM | POA: Insufficient documentation

## 2020-12-29 DIAGNOSIS — R293 Abnormal posture: Secondary | ICD-10-CM | POA: Diagnosis present

## 2020-12-29 DIAGNOSIS — Z483 Aftercare following surgery for neoplasm: Secondary | ICD-10-CM | POA: Insufficient documentation

## 2020-12-29 NOTE — Progress Notes (Incomplete)
Location of Breast Cancer  Malignant neoplasm of upper-outer quadrant of right breast in female, estrogen receptor positive   1      Histology per Pathology Report: 12/01/2020   Component 4 wk ago  SURGICAL PATHOLOGY SURGICAL PATHOLOGY  CASE: [id number]  PATIENT: Destiny Mitchell  Surgical Pathology Report      Clinical History: Right breast cancer (ms)      FINAL MICROSCOPIC DIAGNOSIS:   A. BREAST, RIGHT, LUMPECTOMY:  - Invasive ductal carcinoma, 1.6 cm, Nottingham grade 2 of 3.  - Ductal carcinoma in situ, intermediate nuclear grade with central  necrosis.  - Margins of resection:    - Invasive carcinoma broadly involves the posterior margin.    - Invasive carcinoma is 1 mm from each the lateral and superior  margins.    - In situ carcinoma is 1.5 mm from the posterior margin and 2 mm  from each the lateral and superior margins.  - Biopsy site.  - See oncology table.   B. SENTINEL, LYMPH NODE, RIGHT AXILLARY, BIOPSY:  - One lymph node, negative for carcinoma (0/1).   C. SENTINEL, LYMPH NODE, RIGHT AXILLARY, BIOPSY:  - One lymph node, negative for carcinoma (0/1).   D. BREAST, RIGHT, ADDITIONAL LATERAL MARGIN, EXCISION:  - Breast tissue, negative for carcinoma.   E. BREAST, RIGHT, ADDITIONAL SUPERIOR MARGIN, EXCISION:  - Breast tissue, negative for carcinoma.    ONCOLOGY TABLE:   INVASIVE CARCINOMA OF THE BREAST: Resection   Procedure: Lumpectomy  Specimen Laterality: Right  Histologic Type: Invasive ductal carcinoma  Histologic Grade:    Glandular (Acinar)/Tubular Differentiation: 3    Nuclear Pleomorphism: 3    Mitotic Rate: 1    Overall Grade: 2  Tumor Size: Greatest dimension: 1.6 cm  Ductal Carcinoma In Situ: Present  Tumor Extent: Limited to breast parenchyma  Treatment Effect: No known presurgical therapy  Margins:    Final:       - Posterior: Broadly involved       - Superior: 11 mm       - Lateral:  14 mm       All other margins are 2 mm or greater  DCIS Margins:    Final:       - Posterior: 1.5 mm       - Superior: 12 mm       - Lateral: 15 mm       All other margins are 2 mm or greater  Regional Lymph Nodes:    Number of Lymph Nodes Examined: 2    Number of Sentinel Nodes Examined: 2    Number of Lymph Nodes with Macrometastases: 0    Number of Lymph Nodes with Micrometastases: 0    Number of Lymph Nodes with Isolated Tumor Cells: 0    Size of Largest Metastatic Deposit: Not applicable    Extranodal Extension: Not applicable  Distant Metastasis:    Distant Site(s) Involved: Not applicable  Breast Biomarker Testing Performed on Previous Biopsy: Yes    Testing Performed on Case Number: [id number]       Estrogen Receptor: > 95%, strong       Progesterone Receptor: > 95%, strong       HER2: Negative (1+)       Ki-67: 10%  Pathologic Stage Classification (pTNM, AJCC 8th Edition): pT1c, pN0  Representative Tumor Block: A1  Comment: Dr. Melina Copa reviewed select slides  (v4.5.0.0)   GROSS DESCRIPTION:   A: Specimen type: Right breast seed localization lumpectomy, received  fresh. The specimen is placed in formalin at 10:55 AM on 12/01/2020.  Size: 5.7 cm at the lateral-medial axis, 4.5 cm at the superior-inferior  axis and 2.7 cm at the anterior-posterior axis.  Orientation: The specimen is oriented with previously applied inks  (anterior green, inferior blue, lateral orange, medial yellow, posterior  black, superior red).  Localized area: The localization seed is identified at the time of  receipt.  Cut surface: At the localized area there is a 1.6 x 1.2 x 1.0 cm firm  white mass which has ill-defined borders containing a ribbon-shaped  biopsy clip. The remainder of the breast tissue consists of soft yellow  adipose tissue and white fibrous tissue.  Margins: The mass closely approximates the  posterior margin and is 0.2  cm from each the lateral and superior margins. The remaining margins  are greater than 1 cm.  Prognostic indicators: Obtained from paraffin blocks if needed.  Block summary: 6 blocks submitted.  1-4 = sections of mass to include the posterior, lateral and superior  margins.  5 = anterior margin.  6 = medial and inferior margins.   B: Received fresh is a 0.8 x 0.6 x 0.6 cm tan-yellow lymph node. The  specimen is bisected and entirely submitted  in 1 block.   C: Received fresh is a 1.5 x 0.7 x 0.6 cm tan-yellow lymph node. The  specimen is bisected and entirely submitted in 1 block.   D: Received fresh is a 2.3 x 1.7 x 1.3 cm portion of fibroadipose  tissue, clinically right breast additional lateral margin. The specimen  is oriented with a short suture at superior, long suture at lateral and  double suture at deep. The lateral margin is inked black prior to  sectioning. The cut surfaces consist of soft yellow adipose tissue and  white fibrous tissue. Tumor is not grossly identified. The specimen is  sectioned and entirely submitted in 3 blocks.   B: Received fresh is a 2.0 x 1.9 x 1.0 cm portion of fibroadipose  tissue, clinically right breast additional superior margin. The  specimen is oriented with a short suture at superior, long suture at  lateral and double suture at deep. The superior margin is inked black  prior to sectioning. The cut surfaces consist soft yellow adipose  tissue and white fibrous tissue. Tumor is not grossly identified. The  specimen is sectioned and entirely submitted in 2 blocks.  (GRP 12/02/2020)        Receptor Status: ER(95% strong), PR (95 % strong ), Her2-neu ( Neg 1+  ), Ki-(10%)  Did patient present with symptoms (if so, please note symptoms) or was this found on screening mammography?:   Past/Anticipated interventions by surgeon, if any: 12/01/2020 Dr. Donne Hazel    RIGHT BREAST LUMPECTOMY WITH  RADIOACTIVE SEED AND RIGHT AXILLARY SENTINEL LYMPH NODE BIOPSY  Past/Anticipated interventions by medical oncology, if any: Chemotherapy   Lymphedema issues, if any:   Pain issues, if any:    SAFETY ISSUES:  Prior radiation?   Pacemaker/ICD?   Possible current pregnancy?  Is the patient on methotrexate?   Current Complaints / other details:     Sherrlyn Hock, RN 12/29/2020,12:55 PM

## 2020-12-29 NOTE — Patient Instructions (Signed)

## 2020-12-29 NOTE — Therapy (Signed)
[place], [place], [address] Phone: [phone]   Fax:  [phone]  Physical Therapy Treatment  Patient Details  Name: Destiny Mitchell MRN: [id number] Date of Birth: October 05, 1976 Referring Provider (PT): Dr. Rolm Bookbinder   Encounter Date: 12/29/2020   PT End of Session - 12/29/20 1239    Visit Number 5    Number of Visits 10    Date for PT Re-Evaluation 01/16/21    PT Start Time 1106    PT Stop Time 1206    PT Time Calculation (min) 60 min    Activity Tolerance Patient tolerated treatment well    Behavior During Therapy [facility] for tasks assessed/performed           Past Medical History:  Diagnosis Date  . Anxiety attack   . Back pain   . Chronic kidney disease   . Family history of breast cancer   . Family history of colon cancer   . Family history of kidney cancer   . Family history of lung cancer   . Family history of pancreatic cancer   . Family history of prostate cancer   . Family history of stomach cancer   . Palpitations   . Pre-eclampsia, postpartum   . Sinus tachycardia   . SVD (spontaneous vaginal delivery) 06/22/2014  . Syncope and collapse   . Ureter obstruction    s/p surgery in 2009    Past Surgical History:  Procedure Laterality Date  . BREAST LUMPECTOMY WITH RADIOACTIVE SEED AND SENTINEL LYMPH NODE BIOPSY Right 12/01/2020   Procedure: RIGHT BREAST LUMPECTOMY WITH RADIOACTIVE SEED AND RIGHT AXILLARY SENTINEL LYMPH NODE BIOPSY;  Surgeon: Rolm Bookbinder, MD;  Location: [facility];  Service: General;  Laterality: Right;  PEC BLOCK  . 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 US ECHOCARDIOGRAPHY  01/01/2005   EF 55-60%. NORMAL  . WISDOM TOOTH EXTRACTION      There were no vitals filed for this visit.   Subjective Assessment - 12/29/20 1111    Subjective I was a little sore after last visit that day but felt better the next day. I  feel like my motion is doing much better and might not need to come as often. My mom had a CT scan and it looks like she has breast cancer so we'll learn more about that in the next few days.    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 Patient underwent a right lumpectomy and sentinel node biopsy (2 negative nodes) on 12/01/2020. It is ER/PR positive and HER2 negative with a Ki67 of 10%.    Patient Stated Goals See if my arm is doing ok    Currently in Pain? No/denies                             [facility] Adult PT Treatment/Exercise - 12/29/20 0001      Shoulder Exercises: Supine   Horizontal ABduction Strengthening;Both;5 reps;Theraband    Theraband Level (Shoulder Horizontal ABduction) Level 2 (Red)    Horizontal ABduction Limitations Pt return therapist demo of each    External Rotation Strengthening;Both;5 reps;Theraband    Theraband Level (Shoulder External Rotation) Level 2 (Red)    Flexion Strengthening;Both;5 reps;Theraband    Theraband Level (Shoulder Flexion) Level 2 (Red)    Diagonals Strengthening;Right;Left;5 reps;Theraband    Theraband  Level (Shoulder Diagonals) Level 2 (Red)      Shoulder Exercises: Pulleys   Flexion 2 minutes    ABduction 2 minutes    ABduction Limitations VCs to decrease Lt side trunk lean      Shoulder Exercises: Therapy Ball   Flexion Both;10 reps   forward lean into end of stretch   ABduction Both;10 reps   same side lean into end of stretch     Manual Therapy   Soft tissue mobilization In Supine to Rt upper trap with coconut oil, then in Lt S/L for continued STMand trigger point release to Rt upper trap and also to medial scapular border    Myofascial Release To right axilla, cording conts to be much improved and only mildly palpable now    Passive ROM To right shoulder to pts available flexion, abduction and D2                  PT Education - 12/29/20  1130    Education Details Supine scapular series with red theraband    Person(s) Educated Patient    Methods Explanation;Demonstration;Handout    Comprehension Verbalized understanding;Returned demonstration               PT Long Term Goals - 12/19/20 1212      PT LONG TERM GOAL #1   Title Patient will demonstrate she has regained full shoulder ROM and function post operatively compared to baselines.    Time 4    Period Weeks    Status On-going    Target Date 01/16/21      PT LONG TERM GOAL #2   Title Patient will increase right shoulder flexion to >/= 150 degrees for increased ease reaching.    Baseline 125 degrees    Time 4    Period Weeks    Status New    Target Date 01/16/21      PT LONG TERM GOAL #3   Title Patient will increase right shoulder abduction to >/= 160 degrees for increased ease reaching.    Baseline 130 degrees    Time 4    Period Weeks    Status New    Target Date 01/16/21      PT LONG TERM GOAL #4   Title Patient will report good understanding of lymphedema risk reduction practices.    Time 4    Period Weeks    Status New    Target Date 01/16/21      PT LONG TERM GOAL #5   Title Patient will report >/= 50% improvement in axillary cording with less restriction during end range abduction.    Time 4    Period Weeks    Status New    Target Date 01/16/21                 Plan - 12/29/20 1358    Clinical Impression Statement Continued with AA/ROM to Rt shoulder and then progressed HEP to include supine scapular series though instructed pt to only do this a few times a week until she is 6 weeks out. She verbalized understanding. Continued with fairly aggressive end Rt shoulder P/ROM stretching with MFR to cording at Rt axilla. Her coding, though still present, is much improved being only mildy visible and palpable. pt is also able to demonstrate improved A/ROM and repotrs noting cording being less limiting now than at starte of care. She would  like to decrease freq to 1x/wk for next 1-2 weeks working towards  independence with HEP. Pt will benefit from a few more physical therapy sessions to cont to work towards decreasing cording in Rt axilla, improve end P/ROM, and instruct in HEP progression for pt to be able to self progress at home.    Stability/Clinical Decision Making Stable/Uncomplicated    Rehab Potential Excellent    PT Frequency 2x / week    PT Duration 4 weeks    PT Treatment/Interventions ADLs/Self Care Home Management;Therapeutic exercise;Patient/family education;Manual techniques;Manual lymph drainage;Passive range of motion;Scar mobilization    PT Next Visit Plan Remeasure A/ROM of Rt shoulder and reassess goals next. Review supine scapular series to assess technique and add bil UE 3 way raises; PROM right shoulder; myofascial release for cording; review scar massage for when steri-strips fall off; cont AA/ROM with pulleys and ball roll up wall    PT Home Exercise Plan Post op shoulder ROM HEP and closed chain flexion and abduction; supine scapular series    Consulted and Agree with Plan of Care Patient           Patient will benefit from skilled therapeutic intervention in order to improve the following deficits and impairments:  Postural dysfunction,Decreased range of motion,Decreased knowledge of precautions,Impaired UE functional use,Pain,Decreased scar mobility,Increased edema,Increased fascial restricitons  Visit Diagnosis: Malignant neoplasm of upper-outer quadrant of right breast in female, estrogen receptor positive ([place])  Abnormal posture  Aftercare following surgery for neoplasm  Stiffness of right shoulder, not elsewhere classified     Problem List Patient Active Problem List   Diagnosis Date Noted  . Genetic testing 11/14/2020  . Malignant neoplasm of upper-outer quadrant of right breast in female, estrogen receptor positive ([place]) 11/04/2020  . Family history of prostate cancer   . Family  history of lung cancer   . Family history of kidney cancer   . Family history of stomach cancer   . Family history of breast cancer   . Family history of colon cancer   . Family history of pancreatic cancer   . Dysuria 06/09/2019  . Vaginal discharge 06/09/2019  . Urinary urgency 06/09/2019  . Mallet finger of left hand 12/06/2017  . Pain of left hand 12/06/2017  . Normal labor 06/22/2014  . SVD (spontaneous vaginal delivery) 06/22/2014  . Heart palpitations 09/10/2011  . ACUTE MAXILLARY SINUSITIS 08/27/2009    Otelia Limes, PTA 12/29/2020, 2:07 PM  [place], [place], [address] Phone: [phone]   Fax:  [phone]  Name: Destiny Mitchell MRN: [id number] Date of Birth: 1976/11/07

## 2020-12-29 NOTE — Progress Notes (Signed)
Location of Breast Cancer  Malignant neoplasm of upper-outer quadrant of right breast in female, estrogen receptor positive   1      Histology per Pathology Report: 12/01/2020   Component 4 wk ago  SURGICAL PATHOLOGY SURGICAL PATHOLOGY  CASE: [id number]  PATIENT: Destiny Mitchell  Surgical Pathology Report      Clinical History: Right breast cancer (ms)      FINAL MICROSCOPIC DIAGNOSIS:   A. BREAST, RIGHT, LUMPECTOMY:  - Invasive ductal carcinoma, 1.6 cm, Nottingham grade 2 of 3.  - Ductal carcinoma in situ, intermediate nuclear grade with central  necrosis.  - Margins of resection:    - Invasive carcinoma broadly involves the posterior margin.    - Invasive carcinoma is 1 mm from each the lateral and superior  margins.    - In situ carcinoma is 1.5 mm from the posterior margin and 2 mm  from each the lateral and superior margins.  - Biopsy site.  - See oncology table.   B. SENTINEL, LYMPH NODE, RIGHT AXILLARY, BIOPSY:  - One lymph node, negative for carcinoma (0/1).   C. SENTINEL, LYMPH NODE, RIGHT AXILLARY, BIOPSY:  - One lymph node, negative for carcinoma (0/1).   D. BREAST, RIGHT, ADDITIONAL LATERAL MARGIN, EXCISION:  - Breast tissue, negative for carcinoma.   E. BREAST, RIGHT, ADDITIONAL SUPERIOR MARGIN, EXCISION:  - Breast tissue, negative for carcinoma.    ONCOLOGY TABLE:   INVASIVE CARCINOMA OF THE BREAST: Resection   Procedure: Lumpectomy  Specimen Laterality: Right  Histologic Type: Invasive ductal carcinoma  Histologic Grade:    Glandular (Acinar)/Tubular Differentiation: 3    Nuclear Pleomorphism: 3    Mitotic Rate: 1    Overall Grade: 2  Tumor Size: Greatest dimension: 1.6 cm  Ductal Carcinoma In Situ: Present  Tumor Extent: Limited to breast parenchyma  Treatment Effect: No known presurgical therapy  Margins:    Final:       - Posterior: Broadly involved       - Superior: 11 mm       - Lateral:  14 mm       All other margins are 2 mm or greater  DCIS Margins:    Final:       - Posterior: 1.5 mm       - Superior: 12 mm       - Lateral: 15 mm       All other margins are 2 mm or greater  Regional Lymph Nodes:    Number of Lymph Nodes Examined: 2    Number of Sentinel Nodes Examined: 2    Number of Lymph Nodes with Macrometastases: 0    Number of Lymph Nodes with Micrometastases: 0    Number of Lymph Nodes with Isolated Tumor Cells: 0    Size of Largest Metastatic Deposit: Not applicable    Extranodal Extension: Not applicable  Distant Metastasis:    Distant Site(s) Involved: Not applicable  Breast Biomarker Testing Performed on Previous Biopsy: Yes    Testing Performed on Case Number: [id number]       Estrogen Receptor: > 95%, strong       Progesterone Receptor: > 95%, strong       HER2: Negative (1+)       Ki-67: 10%  Pathologic Stage Classification (pTNM, AJCC 8th Edition): pT1c, pN0  Representative Tumor Block: A1  Comment: Dr. Melina Copa reviewed select slides  (v4.5.0.0)   GROSS DESCRIPTION:   A: Specimen type: Right breast seed localization lumpectomy, received  fresh. The specimen is placed in formalin at 10:55 AM on 12/01/2020.  Size: 5.7 cm at the lateral-medial axis, 4.5 cm at the superior-inferior  axis and 2.7 cm at the anterior-posterior axis.  Orientation: The specimen is oriented with previously applied inks  (anterior green, inferior blue, lateral orange, medial yellow, posterior  black, superior red).  Localized area: The localization seed is identified at the time of  receipt.  Cut surface: At the localized area there is a 1.6 x 1.2 x 1.0 cm firm  white mass which has ill-defined borders containing a ribbon-shaped  biopsy clip. The remainder of the breast tissue consists of soft yellow  adipose tissue and white fibrous tissue.  Margins: The mass closely approximates the  posterior margin and is 0.2  cm from each the lateral and superior margins. The remaining margins  are greater than 1 cm.  Prognostic indicators: Obtained from paraffin blocks if needed.  Block summary: 6 blocks submitted.  1-4 = sections of mass to include the posterior, lateral and superior  margins.  5 = anterior margin.  6 = medial and inferior margins.   B: Received fresh is a 0.8 x 0.6 x 0.6 cm tan-yellow lymph node. The  specimen is bisected and entirely submitted  in 1 block.   C: Received fresh is a 1.5 x 0.7 x 0.6 cm tan-yellow lymph node. The  specimen is bisected and entirely submitted in 1 block.   D: Received fresh is a 2.3 x 1.7 x 1.3 cm portion of fibroadipose  tissue, clinically right breast additional lateral margin. The specimen  is oriented with a short suture at superior, long suture at lateral and  double suture at deep. The lateral margin is inked black prior to  sectioning. The cut surfaces consist of soft yellow adipose tissue and  white fibrous tissue. Tumor is not grossly identified. The specimen is  sectioned and entirely submitted in 3 blocks.   B: Received fresh is a 2.0 x 1.9 x 1.0 cm portion of fibroadipose  tissue, clinically right breast additional superior margin. The  specimen is oriented with a short suture at superior, long suture at  lateral and double suture at deep. The superior margin is inked black  prior to sectioning. The cut surfaces consist soft yellow adipose  tissue and white fibrous tissue. Tumor is not grossly identified. The  specimen is sectioned and entirely submitted in 2 blocks.  (GRP 12/02/2020)        Receptor Status: ER(95% strong), PR (95 % strong ), Her2-neu ( Neg 1+  ), Ki-(10%)  Did patient present with symptoms (if so, please note symptoms) or was this found on screening mammography?: Mammagram  Past/Anticipated interventions by surgeon, if any: 12/01/2020 Dr. Donne Hazel    RIGHT BREAST LUMPECTOMY WITH  RADIOACTIVE SEED AND RIGHT AXILLARY SENTINEL LYMPH NODE BIOPSY  Past/Anticipated interventions by medical oncology, if any: Chemotherapy   Lymphedema issues, if any: None  Pain issues, if any:  None  SAFETY ISSUES:  Prior radiation?  No  Pacemaker/ICD?  No  Possible current pregnancy? No  Is the patient on methotrexate?  No  Current Complaints / other details:  Vitals:   01/04/21 1325  BP: 126/87  Pulse: 78  Resp: 18  Temp: 97.6 F (36.4 C)  SpO2: 100%  Weight: 65.9 kg  Height: 5' 5.5" (1.664 m)       Sherrlyn Hock, RN 12/29/2020,12:36 PM

## 2021-01-02 ENCOUNTER — Other Ambulatory Visit: Payer: Self-pay

## 2021-01-02 ENCOUNTER — Ambulatory Visit: Payer: BLUE CROSS/BLUE SHIELD

## 2021-01-02 DIAGNOSIS — C50411 Malignant neoplasm of upper-outer quadrant of right female breast: Secondary | ICD-10-CM

## 2021-01-02 DIAGNOSIS — M25611 Stiffness of right shoulder, not elsewhere classified: Secondary | ICD-10-CM

## 2021-01-02 DIAGNOSIS — Z483 Aftercare following surgery for neoplasm: Secondary | ICD-10-CM

## 2021-01-02 DIAGNOSIS — R293 Abnormal posture: Secondary | ICD-10-CM

## 2021-01-02 DIAGNOSIS — Z170 Estrogen receptor positive status [ER+]: Secondary | ICD-10-CM

## 2021-01-02 NOTE — Therapy (Addendum)
 [facility] [address] [place], [place], [address] Phone: [phone]   Fax:  [phone]  Physical Therapy Treatment  Patient Details  Name: Destiny Mitchell MRN: [id number] Date of Birth: [birth year] Referring Provider (PT): Dr. Emelia Loron   Encounter Date: 01/02/2021   PT End of Session - 01/02/21 1407     Visit Number 6    Number of Visits 10    Date for PT Re-Evaluation 01/16/21    PT Start Time 1304    PT Stop Time 1406    PT Time Calculation (min) 62 min    Activity Tolerance Patient tolerated treatment well    Behavior During Therapy [facility] for tasks assessed/performed             Past Medical History:  Diagnosis Date   Anxiety attack    Back pain    Chronic kidney disease    Family history of breast cancer    Family history of colon cancer    Family history of kidney cancer    Family history of lung cancer    Family history of pancreatic cancer    Family history of prostate cancer    Family history of stomach cancer    Palpitations    Pre-eclampsia, postpartum    Sinus tachycardia    SVD (spontaneous vaginal delivery) 06/22/2014   Syncope and collapse    Ureter obstruction    s/p surgery in 2009    Past Surgical History:  Procedure Laterality Date   BREAST LUMPECTOMY WITH RADIOACTIVE SEED AND SENTINEL LYMPH NODE BIOPSY Right 12/01/2020   Procedure: RIGHT BREAST LUMPECTOMY WITH RADIOACTIVE SEED AND RIGHT AXILLARY SENTINEL LYMPH NODE BIOPSY;  Surgeon: Emelia Loron, MD;  Location: [facility];  Service: General;  Laterality: Right;  PEC BLOCK   KIDNEY SURGERY  July 2009   Blocked ureter after pregnancy   US ECHOCARDIOGRAPHY  01/01/2005   EF 55-60%. NORMAL   WISDOM TOOTH EXTRACTION      There were no vitals filed for this visit.   Subjective Assessment - 01/02/21 1306     Subjective I just attended the ABC class and that was good. I don't have any questions about that. And  my Rt shoulder is doing much better overall. I've been stretching more at home since you showed me the new exercises and it felt so good to be exercising again!    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 Patient underwent a right lumpectomy and sentinel node biopsy (2 negative nodes) on 12/01/2020. It is ER/PR positive and HER2 negative with a Ki67 of 10%.    Patient Stated Goals See if my arm is doing ok    Currently in Pain? No/denies                               [facility] Adult PT Treatment/Exercise - 01/02/21 0001       Shoulder Exercises: Standing   Other Standing Exercises Bil UE 3 way raises with back against wall/core engaged, and shoulder and head against wall for flexion, scaption and abduction to shoulder height x10 each      Shoulder Exercises: Pulleys   Flexion 2 minutes    ABduction 2 minutes      Shoulder Exercises: Therapy Ball   Flexion Both;10 reps   forward lean into end of stretch   ABduction Both;10  reps   same side lean into end of stretch     Shoulder Exercises: Stretch   Other Shoulder Stretches Modified downward dog on wall 5x, 5sec holds and dropping Rt shoulder to increase stretch at axilla and returning therapist demo      Manual Therapy   Myofascial Release To right axilla, cording conts to be much improved and only mildly palpable now    Passive ROM To right shoulder to pts available flexion, abduction and D2                         PT Long Term Goals - 12/19/20 1212       PT LONG TERM GOAL #1   Title Patient will demonstrate she has regained full shoulder ROM and function post operatively compared to baselines.    Time 4    Period Weeks    Status On-going    Target Date 01/16/21      PT LONG TERM GOAL #2   Title Patient will increase right shoulder flexion to >/= 150 degrees for increased ease reaching.    Baseline 125 degrees    Time 4     Period Weeks    Status New    Target Date 01/16/21      PT LONG TERM GOAL #3   Title Patient will increase right shoulder abduction to >/= 160 degrees for increased ease reaching.    Baseline 130 degrees    Time 4    Period Weeks    Status New    Target Date 01/16/21      PT LONG TERM GOAL #4   Title Patient will report good understanding of lymphedema risk reduction practices.    Time 4    Period Weeks    Status New    Target Date 01/16/21      PT LONG TERM GOAL #5   Title Patient will report >/= 50% improvement in axillary cording with less restriction during end range abduction.    Time 4    Period Weeks    Status New    Target Date 01/16/21                   Plan - 01/02/21 1407     Clinical Impression Statement Pt is doing excellent and has made one more appt next week and then plans to be on hold until after radiation ends unless she needs [place] before then. She is doing well with supine scapular series so progressed in clinic to bil UE 3 way raises but with no weights. Her end Rt shoulder P/ROM is WNLs and cording is very mildly palpable and visible. Pt also reports noticing good improvements with this as she does not feel limited by the cording any longer. She will benefit from one more session next week to finalize HEP before radiation then another session after radiation complete to assess Rt breast and Rt shoulder ROM. Pt is agreeable to this.    Stability/Clinical Decision Making Stable/Uncomplicated    Rehab Potential Excellent    PT Frequency 2x / week    PT Duration 4 weeks    PT Treatment/Interventions ADLs/Self Care Home Management;Therapeutic exercise;Patient/family education;Manual techniques;Manual lymph drainage;Passive range of motion;Scar mobilization    PT Next Visit Plan Remeasure A/ROM of Rt shoulder and reassess goals next. How was appt with Dr. Dwain Sarna? How was radiation simultaion? Add bil UE 3 way raises to HEP; PROM right shoulder; myofascial  release for cording; review scar massage if steri-strips off; cont AA/ROM with pulleys and ball roll up wall    PT Home Exercise Plan Post op shoulder ROM HEP and closed chain flexion and abduction; supine scapular series    Consulted and Agree with Plan of Care Patient             Patient will benefit from skilled therapeutic intervention in order to improve the following deficits and impairments:  Postural dysfunction,Decreased range of motion,Decreased knowledge of precautions,Impaired UE functional use,Pain,Decreased scar mobility,Increased edema,Increased fascial restricitons  Visit Diagnosis: Malignant neoplasm of upper-outer quadrant of right breast in female, estrogen receptor positive (HCC)  Abnormal posture  Aftercare following surgery for neoplasm  Stiffness of right shoulder, not elsewhere classified     Problem List Patient Active Problem List   Diagnosis Date Noted   Genetic testing 11/14/2020   Malignant neoplasm of upper-outer quadrant of right breast in female, estrogen receptor positive (HCC) 11/04/2020   Family history of prostate cancer    Family history of lung cancer    Family history of kidney cancer    Family history of stomach cancer    Family history of breast cancer    Family history of colon cancer    Family history of pancreatic cancer    Dysuria 06/09/2019   Vaginal discharge 06/09/2019   Urinary urgency 06/09/2019   Mallet finger of left hand 12/06/2017   Pain of left hand 12/06/2017   Normal labor 06/22/2014   SVD (spontaneous vaginal delivery) 06/22/2014   Heart palpitations 09/10/2011   ACUTE MAXILLARY SINUSITIS 08/27/2009  PHYSICAL THERAPY DISCHARGE SUMMARY  Visits from Start of Care: 6  Current functional level related to goals / functional outcomes: Per above   Remaining deficits: Lymphedema risk    Education / Equipment: Self care HEP   Plan: Patient agrees to discharge.      Hermenia Bers, PTA 01/02/2021,  4:28 PM  [facility] [address] [place], [place], [address] Phone: [phone]   Fax:  [phone]  Name: Destiny Mitchell MRN: [id number] Date of Birth: January 27, 1976

## 2021-01-04 ENCOUNTER — Ambulatory Visit
Admission: RE | Admit: 2021-01-04 | Discharge: 2021-01-04 | Disposition: A | Discharge status: Home / Self Care | Payer: BLUE CROSS/BLUE SHIELD | Source: Ambulatory Visit | Attending: Radiation Oncology | Admitting: Radiation Oncology

## 2021-01-04 ENCOUNTER — Other Ambulatory Visit: Payer: Self-pay

## 2021-01-04 ENCOUNTER — Encounter: Payer: Self-pay | Admitting: Radiation Oncology

## 2021-01-04 VITALS — BP 126/87 | HR 78 | Temp 97.6°F | Resp 18 | Ht 65.5 in | Wt 145.2 lb

## 2021-01-04 DIAGNOSIS — Z79899 Other long term (current) drug therapy: Secondary | ICD-10-CM | POA: Diagnosis not present

## 2021-01-04 DIAGNOSIS — Z170 Estrogen receptor positive status [ER+]: Secondary | ICD-10-CM | POA: Diagnosis not present

## 2021-01-04 DIAGNOSIS — Z510 Encounter for antineoplastic radiation therapy: Secondary | ICD-10-CM | POA: Diagnosis present

## 2021-01-04 DIAGNOSIS — C50411 Malignant neoplasm of upper-outer quadrant of right female breast: Secondary | ICD-10-CM | POA: Insufficient documentation

## 2021-01-04 DIAGNOSIS — Z803 Family history of malignant neoplasm of breast: Secondary | ICD-10-CM | POA: Diagnosis not present

## 2021-01-04 NOTE — Progress Notes (Signed)
Radiation Oncology         [phone]) [phone] ________________________________  Name: Destiny Mitchell MRN: [id number]  Date: 01/04/2021  DOB: 05/03/1976  Re-Evaluation Note  CC: [name], MD  Nicholas Lose, MD    ICD-10-CM   1. Malignant neoplasm of upper-outer quadrant of right breast in female, estrogen receptor positive ([place])  C50.411    Z17.0     Diagnosis:  Stage IA (pT1c, pN0, cM0) Right Breast UOQ, Invasive Ductal Carcinoma with DCIS, ER+ / PR+ / Her2-, Grade 2  Narrative:  The patient returns today to discuss radiation treatment options. She was seen in consultation on 11/10/2020, at which time it was recommended that she proceed with breast conserving surgery with radiation therapy as a component.  Since consultation, she underwent genetic testing that was negative.  She underwent a right breast lumpectomy with right deep axillary sentinel lymph node biopsy on 12/01/2020 under the care of Dr. Donne Hazel. Pathology from the procedure revealed grade 2 invasive ductal carcinoma that spanned 1.6 cm. There was also noted to be ductal carcinoma in situ, intermediate nuclear grade with central necrosis. Margins of resection: invasive carcinoma broadly involved the posterior margin; invasive carcinoma was 1 mm from each the lateral and superior margins; in-situ carcinoma was 1.5 mm from the posterior margin and 2 mm from each the lateral and superior margins. Two right axillary lymph nodes were biopsied, both of which were negative for carcinoma.  On review of systems, the patient reports no complaints. She denies lymphedema, pain, and any other symptoms.   Of note is the patient says her mother was recently diagnosed with breast cancer and is undergoing work-up for this issue at the present time.   Allergies:  has No Known Allergies.  Meds: Current Outpatient Medications  Medication Sig Dispense Refill  . hydrochlorothiazide (HYDRODIURIL) 25 MG tablet Take 1 tablet (25 mg total)  by mouth daily. 90 tablet 3  . levonorgestrel (LILETTA) 19.5 MCG/DAY IUD IUD 1 Intra Uterine Device (1 each total) by Intrauterine route once for 1 dose. 1 each 0  . traMADol (ULTRAM) 50 MG tablet Take 1 tablet (50 mg total) by mouth every 6 (six) hours as needed. (Patient not taking: Reported on 01/04/2021) 10 tablet 0   No current facility-administered medications for this encounter.    Physical Findings: The patient is in no acute distress. Patient is alert and oriented.  height is 5' 5.5" (1.664 m) and weight is 145 lb 3.2 oz (65.9 kg). Her temperature is 97.6 F (36.4 C). Her blood pressure is 126/87 and her pulse is 78. Her respiration is 18 and oxygen saturation is 100%.  No significant changes. Lungs are clear to auscultation bilaterally. Heart has regular rate and rhythm. No palpable cervical, supraclavicular, or axillary adenopathy. Abdomen soft, non-tender, normal bowel sounds. Left breast: no palpable mass, nipple discharge or bleeding. Right breast: she has a well-healing scar in the low axillary region which encompasses her lumpectomy site as well as her sentinel node procedure.  No signs of infection within the breast.  No dominant mass appreciated nipple discharge or bleeding.  Lab Findings: Lab Results  Component Value Date   WBC 4.3 04/07/2018   HGB 13.1 04/07/2018   HCT 39.6 04/07/2018   MCV 81 04/07/2018   PLT 219 04/07/2018    Radiographic Findings: No results found.  Impression: Stage IA (pT1c, pN0, cM0) Right Breast UOQ, Invasive Ductal Carcinoma with DCIS, ER+ / PR+ / Her2-, Grade 2  The patient would  be an excellent candidate for breast conservation with radiation therapy as a component.  I discussed the overall treatment course side effects and potential toxicities of radiation therapy in the situation with the patient and she agrees with therapy.  Plan:  She is scheduled for CT simulation later today.  She will proceed with radiation therapy in approximately a  week.  Given her young age and the close proximity of her lumpectomy site to the axillary region I would not recommend hypofractionated accelerated radiation therapy in this situation.  She does have a positive deep margin along the chest wall area and therefore she will receive a boost to the lumpectomy cavity after completion of whole breast radiation treatment.  Total time spent in this encounter was 35 minutes which included reviewing the patient's most recent genetic testing, lumpectomy, pathology report, physical examination, and documentation.  -----------------------------------  Blair Promise, PhD, MD  This document serves as a record of services personally performed by Gery Pray, MD. It was created on his behalf by Clerance Lav, a trained medical scribe. The creation of this record is based on the scribe's personal observations and the provider's statements to them. This document has been checked and approved by the attending provider.

## 2021-01-05 ENCOUNTER — Ambulatory Visit: Payer: BLUE CROSS/BLUE SHIELD

## 2021-01-10 ENCOUNTER — Encounter: Payer: Self-pay | Admitting: *Deleted

## 2021-01-11 ENCOUNTER — Ambulatory Visit: Payer: BLUE CROSS/BLUE SHIELD

## 2021-01-11 DIAGNOSIS — C50411 Malignant neoplasm of upper-outer quadrant of right female breast: Secondary | ICD-10-CM | POA: Diagnosis not present

## 2021-01-16 ENCOUNTER — Other Ambulatory Visit (HOSPITAL_COMMUNITY): Payer: Self-pay | Admitting: Registered Nurse

## 2021-01-16 ENCOUNTER — Other Ambulatory Visit: Payer: Self-pay

## 2021-01-16 ENCOUNTER — Ambulatory Visit
Admission: RE | Admit: 2021-01-16 | Discharge: 2021-01-16 | Disposition: A | Discharge status: Home / Self Care | Payer: BLUE CROSS/BLUE SHIELD | Source: Ambulatory Visit | Attending: Radiation Oncology | Admitting: Radiation Oncology

## 2021-01-16 DIAGNOSIS — C50411 Malignant neoplasm of upper-outer quadrant of right female breast: Secondary | ICD-10-CM

## 2021-01-16 DIAGNOSIS — Z170 Estrogen receptor positive status [ER+]: Secondary | ICD-10-CM

## 2021-01-16 MED FILL — levoFLOXacin 500 MG TABS: 500 | 10 days supply | Qty: 10 | Fill #0

## 2021-01-16 MED FILL — ALBUTEROL SULFATE HFA 108 (: 108 (90 BAS | 17 days supply | Qty: 18 | Fill #0

## 2021-01-17 ENCOUNTER — Ambulatory Visit
Admission: RE | Admit: 2021-01-17 | Discharge: 2021-01-17 | Disposition: A | Discharge status: Home / Self Care | Payer: BLUE CROSS/BLUE SHIELD | Source: Ambulatory Visit | Attending: Radiation Oncology | Admitting: Radiation Oncology

## 2021-01-17 ENCOUNTER — Encounter: Payer: Self-pay | Admitting: Radiation Oncology

## 2021-01-17 ENCOUNTER — Other Ambulatory Visit: Payer: Self-pay

## 2021-01-17 DIAGNOSIS — Z170 Estrogen receptor positive status [ER+]: Secondary | ICD-10-CM

## 2021-01-17 DIAGNOSIS — C50411 Malignant neoplasm of upper-outer quadrant of right female breast: Secondary | ICD-10-CM | POA: Diagnosis not present

## 2021-01-17 MED ORDER — RADIAPLEXRX EX GEL: Freq: Once | CUTANEOUS | Status: AC

## 2021-01-17 MED ORDER — ALRA NON-METALLIC DEODORANT (RAD-ONC)
1.0000 "application " | Freq: Once | TOPICAL | Status: AC
  Administered 2021-01-17: 1 via TOPICAL

## 2021-01-18 ENCOUNTER — Ambulatory Visit
Admission: RE | Admit: 2021-01-18 | Discharge: 2021-01-18 | Disposition: A | Discharge status: Home / Self Care | Payer: BLUE CROSS/BLUE SHIELD | Source: Ambulatory Visit | Attending: Radiation Oncology | Admitting: Radiation Oncology

## 2021-01-18 ENCOUNTER — Other Ambulatory Visit: Payer: Self-pay

## 2021-01-18 DIAGNOSIS — C50411 Malignant neoplasm of upper-outer quadrant of right female breast: Secondary | ICD-10-CM | POA: Diagnosis not present

## 2021-01-19 ENCOUNTER — Other Ambulatory Visit: Payer: Self-pay

## 2021-01-19 ENCOUNTER — Ambulatory Visit
Admission: RE | Admit: 2021-01-19 | Discharge: 2021-01-19 | Disposition: A | Discharge status: Home / Self Care | Payer: BLUE CROSS/BLUE SHIELD | Source: Ambulatory Visit | Attending: Radiation Oncology | Admitting: Radiation Oncology

## 2021-01-19 DIAGNOSIS — C50411 Malignant neoplasm of upper-outer quadrant of right female breast: Secondary | ICD-10-CM | POA: Diagnosis not present

## 2021-01-20 ENCOUNTER — Other Ambulatory Visit: Payer: Self-pay

## 2021-01-20 ENCOUNTER — Ambulatory Visit
Admission: RE | Admit: 2021-01-20 | Discharge: 2021-01-20 | Disposition: A | Discharge status: Home / Self Care | Payer: BLUE CROSS/BLUE SHIELD | Source: Ambulatory Visit | Attending: Radiation Oncology | Admitting: Radiation Oncology

## 2021-01-20 DIAGNOSIS — C50411 Malignant neoplasm of upper-outer quadrant of right female breast: Secondary | ICD-10-CM | POA: Diagnosis not present

## 2021-01-23 ENCOUNTER — Ambulatory Visit
Admission: RE | Admit: 2021-01-23 | Discharge: 2021-01-23 | Disposition: A | Discharge status: Home / Self Care | Payer: BLUE CROSS/BLUE SHIELD | Source: Ambulatory Visit | Attending: Radiation Oncology | Admitting: Radiation Oncology

## 2021-01-23 DIAGNOSIS — C50411 Malignant neoplasm of upper-outer quadrant of right female breast: Secondary | ICD-10-CM | POA: Diagnosis not present

## 2021-01-24 ENCOUNTER — Ambulatory Visit
Admission: RE | Admit: 2021-01-24 | Discharge: 2021-01-24 | Disposition: A | Discharge status: Home / Self Care | Payer: BLUE CROSS/BLUE SHIELD | Source: Ambulatory Visit | Attending: Radiation Oncology | Admitting: Radiation Oncology

## 2021-01-24 ENCOUNTER — Other Ambulatory Visit: Payer: Self-pay

## 2021-01-24 DIAGNOSIS — Z510 Encounter for antineoplastic radiation therapy: Secondary | ICD-10-CM | POA: Insufficient documentation

## 2021-01-24 DIAGNOSIS — C50411 Malignant neoplasm of upper-outer quadrant of right female breast: Secondary | ICD-10-CM | POA: Diagnosis not present

## 2021-01-24 DIAGNOSIS — Z170 Estrogen receptor positive status [ER+]: Secondary | ICD-10-CM | POA: Insufficient documentation

## 2021-01-25 ENCOUNTER — Other Ambulatory Visit: Payer: Self-pay

## 2021-01-25 ENCOUNTER — Ambulatory Visit
Admission: RE | Admit: 2021-01-25 | Discharge: 2021-01-25 | Disposition: A | Discharge status: Home / Self Care | Payer: BLUE CROSS/BLUE SHIELD | Source: Ambulatory Visit | Attending: Radiation Oncology | Admitting: Radiation Oncology

## 2021-01-25 ENCOUNTER — Telehealth: Payer: Self-pay | Admitting: Hematology and Oncology

## 2021-01-25 DIAGNOSIS — C50411 Malignant neoplasm of upper-outer quadrant of right female breast: Secondary | ICD-10-CM | POA: Diagnosis not present

## 2021-01-25 NOTE — Telephone Encounter (Signed)
Scheduled appointment per 03/01 schedule message. Attempted to contact patient, left a detailed message about appointment.

## 2021-01-26 ENCOUNTER — Ambulatory Visit
Admission: RE | Admit: 2021-01-26 | Discharge: 2021-01-26 | Disposition: A | Discharge status: Home / Self Care | Payer: BLUE CROSS/BLUE SHIELD | Source: Ambulatory Visit | Attending: Radiation Oncology | Admitting: Radiation Oncology

## 2021-01-26 ENCOUNTER — Other Ambulatory Visit: Payer: Self-pay

## 2021-01-26 DIAGNOSIS — C50411 Malignant neoplasm of upper-outer quadrant of right female breast: Secondary | ICD-10-CM | POA: Diagnosis not present

## 2021-01-27 ENCOUNTER — Ambulatory Visit
Admission: RE | Admit: 2021-01-27 | Discharge: 2021-01-27 | Disposition: A | Discharge status: Home / Self Care | Payer: BLUE CROSS/BLUE SHIELD | Source: Ambulatory Visit | Attending: Radiation Oncology | Admitting: Radiation Oncology

## 2021-01-27 ENCOUNTER — Other Ambulatory Visit: Payer: Self-pay

## 2021-01-27 DIAGNOSIS — C50411 Malignant neoplasm of upper-outer quadrant of right female breast: Secondary | ICD-10-CM | POA: Diagnosis not present

## 2021-01-30 ENCOUNTER — Ambulatory Visit
Admission: RE | Admit: 2021-01-30 | Discharge: 2021-01-30 | Disposition: A | Discharge status: Home / Self Care | Payer: BLUE CROSS/BLUE SHIELD | Source: Ambulatory Visit | Attending: Radiation Oncology | Admitting: Radiation Oncology

## 2021-01-30 DIAGNOSIS — C50411 Malignant neoplasm of upper-outer quadrant of right female breast: Secondary | ICD-10-CM | POA: Diagnosis not present

## 2021-01-31 ENCOUNTER — Ambulatory Visit
Admission: RE | Admit: 2021-01-31 | Discharge: 2021-01-31 | Disposition: A | Discharge status: Home / Self Care | Payer: BLUE CROSS/BLUE SHIELD | Source: Ambulatory Visit | Attending: Radiation Oncology | Admitting: Radiation Oncology

## 2021-01-31 ENCOUNTER — Other Ambulatory Visit: Payer: Self-pay

## 2021-01-31 DIAGNOSIS — C50411 Malignant neoplasm of upper-outer quadrant of right female breast: Secondary | ICD-10-CM | POA: Diagnosis not present

## 2021-02-01 ENCOUNTER — Other Ambulatory Visit: Payer: Self-pay

## 2021-02-01 ENCOUNTER — Ambulatory Visit
Admission: RE | Admit: 2021-02-01 | Discharge: 2021-02-01 | Disposition: A | Discharge status: Home / Self Care | Payer: BLUE CROSS/BLUE SHIELD | Source: Ambulatory Visit | Attending: Radiation Oncology | Admitting: Radiation Oncology

## 2021-02-01 DIAGNOSIS — C50411 Malignant neoplasm of upper-outer quadrant of right female breast: Secondary | ICD-10-CM | POA: Diagnosis not present

## 2021-02-02 ENCOUNTER — Other Ambulatory Visit: Payer: Self-pay

## 2021-02-02 ENCOUNTER — Ambulatory Visit
Admission: RE | Admit: 2021-02-02 | Discharge: 2021-02-02 | Disposition: A | Discharge status: Home / Self Care | Payer: BLUE CROSS/BLUE SHIELD | Source: Ambulatory Visit | Attending: Radiation Oncology | Admitting: Radiation Oncology

## 2021-02-02 DIAGNOSIS — C50411 Malignant neoplasm of upper-outer quadrant of right female breast: Secondary | ICD-10-CM | POA: Diagnosis not present

## 2021-02-03 ENCOUNTER — Other Ambulatory Visit: Payer: Self-pay

## 2021-02-03 ENCOUNTER — Ambulatory Visit
Admission: RE | Admit: 2021-02-03 | Discharge: 2021-02-03 | Disposition: A | Discharge status: Home / Self Care | Payer: BLUE CROSS/BLUE SHIELD | Source: Ambulatory Visit | Attending: Radiation Oncology | Admitting: Radiation Oncology

## 2021-02-03 DIAGNOSIS — C50411 Malignant neoplasm of upper-outer quadrant of right female breast: Secondary | ICD-10-CM | POA: Diagnosis not present

## 2021-02-06 ENCOUNTER — Other Ambulatory Visit: Payer: Self-pay

## 2021-02-06 ENCOUNTER — Ambulatory Visit
Admission: RE | Admit: 2021-02-06 | Discharge: 2021-02-06 | Disposition: A | Discharge status: Home / Self Care | Payer: BLUE CROSS/BLUE SHIELD | Source: Ambulatory Visit | Attending: Radiation Oncology | Admitting: Radiation Oncology

## 2021-02-06 DIAGNOSIS — C50411 Malignant neoplasm of upper-outer quadrant of right female breast: Secondary | ICD-10-CM | POA: Diagnosis not present

## 2021-02-07 ENCOUNTER — Ambulatory Visit
Admission: RE | Admit: 2021-02-07 | Discharge: 2021-02-07 | Disposition: A | Discharge status: Home / Self Care | Payer: BLUE CROSS/BLUE SHIELD | Source: Ambulatory Visit | Attending: Radiation Oncology | Admitting: Radiation Oncology

## 2021-02-07 ENCOUNTER — Other Ambulatory Visit: Payer: Self-pay

## 2021-02-07 DIAGNOSIS — C50411 Malignant neoplasm of upper-outer quadrant of right female breast: Secondary | ICD-10-CM | POA: Diagnosis not present

## 2021-02-08 ENCOUNTER — Ambulatory Visit
Admission: RE | Admit: 2021-02-08 | Discharge: 2021-02-08 | Disposition: A | Discharge status: Home / Self Care | Payer: BLUE CROSS/BLUE SHIELD | Source: Ambulatory Visit | Attending: Radiation Oncology | Admitting: Radiation Oncology

## 2021-02-08 ENCOUNTER — Other Ambulatory Visit: Payer: Self-pay

## 2021-02-08 DIAGNOSIS — C50411 Malignant neoplasm of upper-outer quadrant of right female breast: Secondary | ICD-10-CM | POA: Diagnosis not present

## 2021-02-09 ENCOUNTER — Ambulatory Visit
Admission: RE | Admit: 2021-02-09 | Discharge: 2021-02-09 | Disposition: A | Discharge status: Home / Self Care | Payer: BLUE CROSS/BLUE SHIELD | Source: Ambulatory Visit | Attending: Radiation Oncology | Admitting: Radiation Oncology

## 2021-02-09 DIAGNOSIS — C50411 Malignant neoplasm of upper-outer quadrant of right female breast: Secondary | ICD-10-CM | POA: Diagnosis not present

## 2021-02-10 ENCOUNTER — Ambulatory Visit
Admission: RE | Admit: 2021-02-10 | Discharge: 2021-02-10 | Disposition: A | Discharge status: Home / Self Care | Payer: BLUE CROSS/BLUE SHIELD | Source: Ambulatory Visit | Attending: Radiation Oncology | Admitting: Radiation Oncology

## 2021-02-10 ENCOUNTER — Other Ambulatory Visit: Payer: Self-pay

## 2021-02-10 DIAGNOSIS — C50411 Malignant neoplasm of upper-outer quadrant of right female breast: Secondary | ICD-10-CM | POA: Diagnosis not present

## 2021-02-13 ENCOUNTER — Other Ambulatory Visit: Payer: Self-pay

## 2021-02-13 ENCOUNTER — Ambulatory Visit
Admission: RE | Admit: 2021-02-13 | Discharge: 2021-02-13 | Disposition: A | Discharge status: Home / Self Care | Payer: BLUE CROSS/BLUE SHIELD | Source: Ambulatory Visit | Attending: Radiation Oncology | Admitting: Radiation Oncology

## 2021-02-13 DIAGNOSIS — C50411 Malignant neoplasm of upper-outer quadrant of right female breast: Secondary | ICD-10-CM | POA: Diagnosis not present

## 2021-02-14 ENCOUNTER — Ambulatory Visit
Admission: RE | Admit: 2021-02-14 | Discharge: 2021-02-14 | Disposition: A | Discharge status: Home / Self Care | Payer: BLUE CROSS/BLUE SHIELD | Source: Ambulatory Visit | Attending: Radiation Oncology | Admitting: Radiation Oncology

## 2021-02-14 DIAGNOSIS — C50411 Malignant neoplasm of upper-outer quadrant of right female breast: Secondary | ICD-10-CM | POA: Diagnosis not present

## 2021-02-15 ENCOUNTER — Ambulatory Visit: Payer: BLUE CROSS/BLUE SHIELD

## 2021-02-16 ENCOUNTER — Other Ambulatory Visit: Payer: Self-pay

## 2021-02-16 ENCOUNTER — Ambulatory Visit
Admission: RE | Admit: 2021-02-16 | Discharge: 2021-02-16 | Disposition: A | Discharge status: Home / Self Care | Payer: BLUE CROSS/BLUE SHIELD | Source: Ambulatory Visit | Attending: Radiation Oncology | Admitting: Radiation Oncology

## 2021-02-16 DIAGNOSIS — C50411 Malignant neoplasm of upper-outer quadrant of right female breast: Secondary | ICD-10-CM | POA: Diagnosis not present

## 2021-02-17 ENCOUNTER — Other Ambulatory Visit (HOSPITAL_BASED_OUTPATIENT_CLINIC_OR_DEPARTMENT_OTHER): Payer: Self-pay

## 2021-02-17 ENCOUNTER — Ambulatory Visit
Admission: RE | Admit: 2021-02-17 | Discharge: 2021-02-17 | Disposition: A | Discharge status: Home / Self Care | Payer: BLUE CROSS/BLUE SHIELD | Source: Ambulatory Visit | Attending: Radiation Oncology | Admitting: Radiation Oncology

## 2021-02-17 DIAGNOSIS — C50411 Malignant neoplasm of upper-outer quadrant of right female breast: Secondary | ICD-10-CM | POA: Diagnosis not present

## 2021-02-18 DIAGNOSIS — B029 Zoster without complications: Secondary | ICD-10-CM | POA: Insufficient documentation

## 2021-02-20 ENCOUNTER — Other Ambulatory Visit: Payer: Self-pay

## 2021-02-20 ENCOUNTER — Ambulatory Visit
Admission: RE | Admit: 2021-02-20 | Discharge: 2021-02-20 | Disposition: A | Discharge status: Home / Self Care | Payer: BLUE CROSS/BLUE SHIELD | Source: Ambulatory Visit | Attending: Radiation Oncology | Admitting: Radiation Oncology

## 2021-02-20 DIAGNOSIS — C50411 Malignant neoplasm of upper-outer quadrant of right female breast: Secondary | ICD-10-CM | POA: Diagnosis not present

## 2021-02-21 ENCOUNTER — Ambulatory Visit
Admission: RE | Admit: 2021-02-21 | Discharge: 2021-02-21 | Disposition: A | Discharge status: Home / Self Care | Payer: BLUE CROSS/BLUE SHIELD | Source: Ambulatory Visit | Attending: Radiation Oncology | Admitting: Radiation Oncology

## 2021-02-21 ENCOUNTER — Ambulatory Visit: Payer: BLUE CROSS/BLUE SHIELD | Admitting: Radiation Oncology

## 2021-02-21 DIAGNOSIS — C50411 Malignant neoplasm of upper-outer quadrant of right female breast: Secondary | ICD-10-CM | POA: Diagnosis not present

## 2021-02-22 ENCOUNTER — Ambulatory Visit
Admission: RE | Admit: 2021-02-22 | Discharge: 2021-02-22 | Disposition: A | Discharge status: Home / Self Care | Payer: BLUE CROSS/BLUE SHIELD | Source: Ambulatory Visit | Attending: Radiation Oncology | Admitting: Radiation Oncology

## 2021-02-22 ENCOUNTER — Other Ambulatory Visit: Payer: Self-pay

## 2021-02-22 DIAGNOSIS — C50411 Malignant neoplasm of upper-outer quadrant of right female breast: Secondary | ICD-10-CM | POA: Diagnosis not present

## 2021-02-23 ENCOUNTER — Ambulatory Visit: Payer: BLUE CROSS/BLUE SHIELD

## 2021-02-23 ENCOUNTER — Other Ambulatory Visit: Payer: Self-pay

## 2021-02-23 ENCOUNTER — Ambulatory Visit
Admission: RE | Admit: 2021-02-23 | Discharge: 2021-02-23 | Disposition: A | Discharge status: Home / Self Care | Payer: BLUE CROSS/BLUE SHIELD | Source: Ambulatory Visit | Attending: Radiation Oncology | Admitting: Radiation Oncology

## 2021-02-23 DIAGNOSIS — C50411 Malignant neoplasm of upper-outer quadrant of right female breast: Secondary | ICD-10-CM | POA: Diagnosis not present

## 2021-02-24 ENCOUNTER — Other Ambulatory Visit: Payer: Self-pay

## 2021-02-24 ENCOUNTER — Ambulatory Visit: Payer: BLUE CROSS/BLUE SHIELD

## 2021-02-24 ENCOUNTER — Ambulatory Visit
Admission: RE | Admit: 2021-02-24 | Discharge: 2021-02-24 | Disposition: A | Discharge status: Home / Self Care | Payer: BLUE CROSS/BLUE SHIELD | Source: Ambulatory Visit | Attending: Radiation Oncology | Admitting: Radiation Oncology

## 2021-02-24 DIAGNOSIS — C50411 Malignant neoplasm of upper-outer quadrant of right female breast: Secondary | ICD-10-CM | POA: Insufficient documentation

## 2021-02-24 DIAGNOSIS — Z170 Estrogen receptor positive status [ER+]: Secondary | ICD-10-CM | POA: Diagnosis not present

## 2021-02-24 DIAGNOSIS — Z510 Encounter for antineoplastic radiation therapy: Secondary | ICD-10-CM | POA: Diagnosis present

## 2021-02-27 ENCOUNTER — Other Ambulatory Visit (HOSPITAL_COMMUNITY): Payer: Self-pay

## 2021-02-27 ENCOUNTER — Other Ambulatory Visit: Payer: Self-pay

## 2021-02-27 ENCOUNTER — Ambulatory Visit
Admission: RE | Admit: 2021-02-27 | Discharge: 2021-02-27 | Disposition: A | Discharge status: Home / Self Care | Payer: BLUE CROSS/BLUE SHIELD | Source: Ambulatory Visit | Attending: Radiation Oncology | Admitting: Radiation Oncology

## 2021-02-27 DIAGNOSIS — C50411 Malignant neoplasm of upper-outer quadrant of right female breast: Secondary | ICD-10-CM | POA: Diagnosis not present

## 2021-02-27 MED FILL — Hydrochlorothiazide Tab 25 MG: ORAL | 30 days supply | Qty: 30 | Fill #0 | Status: AC

## 2021-02-28 ENCOUNTER — Ambulatory Visit
Admission: RE | Admit: 2021-02-28 | Discharge: 2021-02-28 | Disposition: A | Discharge status: Home / Self Care | Payer: BLUE CROSS/BLUE SHIELD | Source: Ambulatory Visit | Attending: Radiation Oncology | Admitting: Radiation Oncology

## 2021-02-28 DIAGNOSIS — C50411 Malignant neoplasm of upper-outer quadrant of right female breast: Secondary | ICD-10-CM | POA: Diagnosis not present

## 2021-03-01 ENCOUNTER — Ambulatory Visit
Admission: RE | Admit: 2021-03-01 | Discharge: 2021-03-01 | Disposition: A | Discharge status: Home / Self Care | Payer: BLUE CROSS/BLUE SHIELD | Source: Ambulatory Visit | Attending: Radiation Oncology | Admitting: Radiation Oncology

## 2021-03-01 ENCOUNTER — Other Ambulatory Visit: Payer: Self-pay

## 2021-03-01 DIAGNOSIS — C50411 Malignant neoplasm of upper-outer quadrant of right female breast: Secondary | ICD-10-CM | POA: Diagnosis not present

## 2021-03-02 ENCOUNTER — Other Ambulatory Visit: Payer: Self-pay

## 2021-03-02 ENCOUNTER — Ambulatory Visit
Admission: RE | Admit: 2021-03-02 | Discharge: 2021-03-02 | Disposition: A | Discharge status: Home / Self Care | Payer: BLUE CROSS/BLUE SHIELD | Source: Ambulatory Visit | Attending: Radiation Oncology | Admitting: Radiation Oncology

## 2021-03-02 DIAGNOSIS — C50411 Malignant neoplasm of upper-outer quadrant of right female breast: Secondary | ICD-10-CM | POA: Diagnosis not present

## 2021-03-02 NOTE — Progress Notes (Signed)
Patient Care Team: [name], MD as PCP - General (Internal Medicine)  DIAGNOSIS:    ICD-10-CM   1. Malignant neoplasm of upper-outer quadrant of right breast in female, estrogen receptor positive ([place])  C50.411    Z17.0     SUMMARY OF ONCOLOGIC HISTORY: Oncology History  Malignant neoplasm of upper-outer quadrant of right breast in female, estrogen receptor positive ([place])  10/31/2020 Initial Diagnosis    Screening detected right breast mass 10:30 position 1.7 cm by ultrasound, numerous benign-appearing cysts largest measuring 1.3 cm.  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   11/04/2020 Cancer Staging   Staging form: Breast, AJCC 8th Edition - Clinical: Stage IA (cT1c, cN0, cM0, G2, ER+, PR+, HER2-) - Signed by Nicholas Lose, MD on 11/04/2020   11/11/2020 Genetic Testing   Negative genetic testing on the STAT and Multicancer panel.  The Multi-Gene Panel offered by [facility] includes sequencing and/or deletion duplication testing of the following 85 genes: AIP, ALK, APC, ATM, AXIN2,BAP1,  BARD1, BLM, BMPR1A, BRCA1, BRCA2, BRIP1, CASR, CDC73, CDH1, CDK4, CDKN1B, CDKN1C, CDKN2A (p14ARF), CDKN2A (p16INK4a), CEBPA, CHEK2, CTNNA1, DICER1, DIS3L2, EGFR (c.2369C>T, p.Thr790Met variant only), EPCAM (Deletion/duplication testing only), FH, FLCN, GATA2, GPC3, GREM1 (Promoter region deletion/duplication testing only), HOXB13 (c.251G>A, p.Gly84Glu), HRAS, KIT, MAX, MEN1, MET, MITF (c.952G>A, p.Glu318Lys variant only), MLH1, MSH2, MSH3, MSH6, MUTYH, NBN, NF1, NF2, NTHL1, PALB2, PDGFRA, PHOX2B, PMS2, POLD1, POLE, POT1, PRKAR1A, PTCH1, PTEN, RAD50, RAD51C, RAD51D, RB1, RECQL4, RET, RNF43, RUNX1, SDHAF2, SDHA (sequence changes only), SDHB, SDHC, SDHD, SMAD4, SMARCA4, SMARCB1, SMARCE1, STK11, SUFU, TERC, TERT, TMEM127, TP53, TSC1, TSC2, VHL, WRN and WT1.  The report date is 11/13/2020.   12/01/2020 Surgery    Right lumpectomy Donne Hazel): invasive and in situ ductal carcinoma, 1.6cm, grade 2, involved posterior margin, 2 right axillary lymph nodes negative for carcinoma.   12/16/2020 Oncotype testing   Oncotype DX score: 5; risk of distant recurrence at 9 years: 3%   01/17/2021 - 03/03/2021 Radiation Therapy   Adjuvant radiation    03/15/2021 -  Anti-estrogen oral therapy   Tamoxifen 20 mg daily     CHIEF COMPLIANT: Follow-up to discuss antiestrogen therapy  INTERVAL HISTORY: Destiny Mitchell is a 45 y.o. with above-mentioned history of right breast cancer. She underwent a right lumpectomy with Dr. Donne Hazel on 12/01/20 for which pathology showed invasive and in situ ductal carcinoma, 1.6cm, grade 2, involved posterior margin, 2 right axillary lymph nodes negative for carcinoma. She started radiation on 01/17/21. She presents to the clinic today for follow-up to discuss antiestrogen therapy.  She tolerated radiation extremely well without any major problems or concerns.  ALLERGIES:  has No Known Allergies.  MEDICATIONS:  Current Outpatient Medications  Medication Sig Dispense Refill  . [START ON 04/17/2021] tamoxifen (NOLVADEX) 20 MG tablet Take 1 tablet (20 mg total) by mouth daily. 90 tablet 3  . hydrochlorothiazide (HYDRODIURIL) 25 MG tablet TAKE 1 TABLET BY MOUTH DAILY 90 tablet 3  . levonorgestrel (LILETTA) 19.5 MCG/DAY IUD IUD 1 Intra Uterine Device (1 each total) by Intrauterine route once for 1 dose. 1 each 0   No current facility-administered medications for this visit.    PHYSICAL EXAMINATION: ECOG PERFORMANCE STATUS: 1 - Symptomatic but completely ambulatory  Vitals:   03/03/21 0826  BP: (!) 140/91  Pulse: 76  Resp: 20  Temp: 97.9 F (36.6 C)  SpO2: 100%   Filed Weights   03/03/21 0826  Weight: 144 lb 4.8 oz (65.5 kg)       LABORATORY DATA:  I have reviewed the data as listed CMP Latest Ref Rng & Units 11/24/2020 04/07/2018 04/04/2017  Glucose 70 - 99 mg/dL 110(H) 86 -   BUN 6 - 20 mg/dL 23(H) 17 -  Creatinine 0.44 - 1.00 mg/dL 0.81 0.92 -  Sodium 135 - 145 mmol/L 137 139 -  Potassium 3.5 - 5.1 mmol/L 3.2(L) 4.0 -  Chloride 98 - 111 mmol/L 101 100 -  CO2 22 - 32 mmol/L 26 25 -  Calcium 8.9 - 10.3 mg/dL 9.8 9.2 -  Total Protein 6.0 - 8.5 g/dL - 7.1 7.1  Total Bilirubin 0.0 - 1.2 mg/dL - 0.6 0.5  Alkaline Phos 39 - 117 IU/L - 64 72  AST 0 - 40 IU/L - 17 17  ALT 0 - 32 IU/L - 13 9    Lab Results  Component Value Date   WBC 4.3 04/07/2018   HGB 13.1 04/07/2018   HCT 39.6 04/07/2018   MCV 81 04/07/2018   PLT 219 04/07/2018   NEUTROABS 2.7 04/07/2018    ASSESSMENT & PLAN:  Malignant neoplasm of upper-outer quadrant of right breast in female, estrogen receptor positive ([place]) 12/01/2020:Right lumpectomy Donne Hazel): invasive and in situ ductal carcinoma, 1.6cm, grade 2, involved posterior margin, 2 right axillary lymph nodes negative for carcinoma.  ER greater than 95%, PR greater than 95%, Ki-67 10%, HER-2 negative Oncotype DX score: 5; risk of distant recurrence at 9 years: 3% Adjuvant radiation 01/17/2021-03/03/2021  Treatment plan: Tamoxifen 20 mg daily to start 03/17/2021 Tamoxifen counseling: We discussed the risks and benefits of tamoxifen. These include but not limited to insomnia, hot flashes, mood changes, vaginal dryness, and weight gain. Although rare, serious side effects including endometrial cancer, risk of blood clots were also discussed. We strongly believe that the benefits far outweigh the risks. Patient understands these risks and consented to starting treatment. Planned treatment duration is 10 years.  Return to clinic in 3 months for survivorship care plan visit    No orders of the defined types were placed in this encounter.  The patient has a good understanding of the overall plan. she agrees with it. she will call with any problems that may develop before the next visit here.  Total time spent: 30 mins including face to face time  and time spent for planning, charting and coordination of care  Rulon Eisenmenger, MD, MPH 03/03/2021  I, Molly Dorshimer, am acting as scribe for Dr. Nicholas Lose.  I have reviewed the above documentation for accuracy and completeness, and I agree with the above.

## 2021-03-03 ENCOUNTER — Encounter: Payer: Self-pay | Admitting: Radiation Oncology

## 2021-03-03 ENCOUNTER — Ambulatory Visit
Admission: RE | Admit: 2021-03-03 | Discharge: 2021-03-03 | Disposition: A | Discharge status: Home / Self Care | Payer: BLUE CROSS/BLUE SHIELD | Source: Ambulatory Visit | Attending: Radiation Oncology | Admitting: Radiation Oncology

## 2021-03-03 ENCOUNTER — Inpatient Hospital Stay: Payer: BLUE CROSS/BLUE SHIELD | Attending: Hematology and Oncology | Admitting: Hematology and Oncology

## 2021-03-03 ENCOUNTER — Ambulatory Visit: Payer: BLUE CROSS/BLUE SHIELD

## 2021-03-03 ENCOUNTER — Other Ambulatory Visit: Payer: Self-pay

## 2021-03-03 ENCOUNTER — Telehealth: Payer: Self-pay | Admitting: Hematology and Oncology

## 2021-03-03 ENCOUNTER — Other Ambulatory Visit (HOSPITAL_COMMUNITY): Payer: Self-pay

## 2021-03-03 DIAGNOSIS — N6001 Solitary cyst of right breast: Secondary | ICD-10-CM | POA: Insufficient documentation

## 2021-03-03 DIAGNOSIS — Z79899 Other long term (current) drug therapy: Secondary | ICD-10-CM | POA: Diagnosis not present

## 2021-03-03 DIAGNOSIS — Z793 Long term (current) use of hormonal contraceptives: Secondary | ICD-10-CM | POA: Insufficient documentation

## 2021-03-03 DIAGNOSIS — Z170 Estrogen receptor positive status [ER+]: Secondary | ICD-10-CM | POA: Diagnosis not present

## 2021-03-03 DIAGNOSIS — C50411 Malignant neoplasm of upper-outer quadrant of right female breast: Secondary | ICD-10-CM | POA: Diagnosis not present

## 2021-03-03 MED ORDER — TAMOXIFEN CITRATE 20 MG PO TABS
20.0000 mg | ORAL_TABLET | Freq: Every day | ORAL | 3 refills | Status: DC
Start: 2021-04-17 — End: 2022-06-04
  Filled 2021-03-03 – 2021-07-13 (×2): qty 30, 30d supply, fill #0
  Filled 2021-08-15: qty 30, 30d supply, fill #1
  Filled 2021-10-08: qty 90, 90d supply, fill #2
  Filled 2022-02-21: qty 90, 90d supply, fill #3

## 2021-03-03 NOTE — Assessment & Plan Note (Signed)
12/01/2020:Right lumpectomy Donne Hazel): invasive and in situ ductal carcinoma, 1.6cm, grade 2, involved posterior margin, 2 right axillary lymph nodes negative for carcinoma.  ER greater than 95%, PR greater than 95%, Ki-67 10%, HER-2 negative Oncotype DX score: 5; risk of distant recurrence at 9 years: 3% Adjuvant radiation 01/17/2021-03/03/2021  Treatment plan: Tamoxifen 20 mg daily to start 03/15/2021 Tamoxifen counseling: We discussed the risks and benefits of tamoxifen. These include but not limited to insomnia, hot flashes, mood changes, vaginal dryness, and weight gain. Although rare, serious side effects including endometrial cancer, risk of blood clots were also discussed. We strongly believe that the benefits far outweigh the risks. Patient understands these risks and consented to starting treatment. Planned treatment duration is 10 years.  Return to clinic in 3 months for survivorship care plan visit

## 2021-03-03 NOTE — Telephone Encounter (Signed)
Scheduled follow-up appointment per 4/8 los. Patient is aware.

## 2021-03-06 ENCOUNTER — Other Ambulatory Visit: Payer: Self-pay

## 2021-03-06 ENCOUNTER — Ambulatory Visit: Payer: BLUE CROSS/BLUE SHIELD

## 2021-03-06 ENCOUNTER — Ambulatory Visit: Payer: BLUE CROSS/BLUE SHIELD | Attending: General Surgery

## 2021-03-06 ENCOUNTER — Encounter: Payer: Self-pay | Admitting: *Deleted

## 2021-03-06 DIAGNOSIS — Z483 Aftercare following surgery for neoplasm: Secondary | ICD-10-CM | POA: Insufficient documentation

## 2021-03-06 NOTE — Therapy (Signed)
[place], [place], [address] Phone: [phone]   Fax:  [phone]  Physical Therapy Treatment  Patient Details  Name: Destiny Mitchell MRN: [id number] Date of Birth: Jun 02, 1976 Referring Provider (PT): Dr. Rolm Bookbinder   Encounter Date: 03/06/2021   PT End of Session - 03/06/21 0954    Visit Number 6   # unchanged due to screen only   Number of Visits 10    Date for PT Re-Evaluation 01/16/21    PT Start Time 0942    PT Stop Time 0953    PT Time Calculation (min) 11 min    Activity Tolerance Patient tolerated treatment well    Behavior During Therapy [facility] for tasks assessed/performed           Past Medical History:  Diagnosis Date  . Anxiety attack   . Back pain   . Chronic kidney disease   . Family history of breast cancer   . Family history of colon cancer   . Family history of kidney cancer   . Family history of lung cancer   . Family history of pancreatic cancer   . Family history of prostate cancer   . Family history of stomach cancer   . Palpitations   . Pre-eclampsia, postpartum   . Sinus tachycardia   . SVD (spontaneous vaginal delivery) 06/22/2014  . Syncope and collapse   . Ureter obstruction    s/p surgery in 2009    Past Surgical History:  Procedure Laterality Date  . BREAST LUMPECTOMY WITH RADIOACTIVE SEED AND SENTINEL LYMPH NODE BIOPSY Right 12/01/2020   Procedure: RIGHT BREAST LUMPECTOMY WITH RADIOACTIVE SEED AND RIGHT AXILLARY SENTINEL LYMPH NODE BIOPSY;  Surgeon: Rolm Bookbinder, MD;  Location: [facility];  Service: General;  Laterality: Right;  PEC BLOCK  . 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 US ECHOCARDIOGRAPHY  01/01/2005   EF 55-60%. NORMAL  . WISDOM TOOTH EXTRACTION      There were no vitals filed for this visit.   Subjective Assessment - 03/06/21 0943    Subjective Pt returns for her 3 month L-Dex screen. "I  just finished radiation last Friday. I somehow ended up with shingles at the inferior aspect of my breast but I am already starting to feel better now that that is behind me."    Pertinent History Patient was diagnosed on 10/31/2020 with 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 Patient underwent a right lumpectomy and sentinel node biopsy (2 negative nodes) on 12/01/2020. It is ER/PR positive and HER2 negative with a Ki67 of 10%.                  L-DEX FLOWSHEETS - 03/06/21 0900      L-DEX LYMPHEDEMA SCREENING   Measurement Type Unilateral    L-DEX MEASUREMENT EXTREMITY Upper Extremity    POSITION  Standing    DOMINANT SIDE Right    At Risk Side Right    BASELINE SCORE (UNILATERAL) -0.5    L-DEX SCORE (UNILATERAL) -2.8    VALUE CHANGE (UNILAT) -2.3                                  PT Long Term Goals - 12/19/20 1212      PT LONG TERM GOAL #1   Title Patient will demonstrate she has regained full shoulder ROM and  function post operatively compared to baselines.    Time 4    Period Weeks    Status On-going    Target Date 01/16/21      PT LONG TERM GOAL #2   Title Patient will increase right shoulder flexion to >/= 150 degrees for increased ease reaching.    Baseline 125 degrees    Time 4    Period Weeks    Status New    Target Date 01/16/21      PT LONG TERM GOAL #3   Title Patient will increase right shoulder abduction to >/= 160 degrees for increased ease reaching.    Baseline 130 degrees    Time 4    Period Weeks    Status New    Target Date 01/16/21      PT LONG TERM GOAL #4   Title Patient will report good understanding of lymphedema risk reduction practices.    Time 4    Period Weeks    Status New    Target Date 01/16/21      PT LONG TERM GOAL #5   Title Patient will report >/= 50% improvement in axillary cording with less restriction during end range abduction.    Time 4    Period Weeks    Status New    Target Date  01/16/21                 Plan - 03/06/21 0955    Clinical Impression Statement Pt returns for her 3 month L-Dex screen. Her change from baseline of  is -2.3 is WNLs so no further treatment is required at this time except to cont every 3 month L-Dex screens which pt is agreeable to.    PT Next Visit Plan Cont every 3 month L-Dex screens for up to 2 years from her SLNB.    Consulted and Agree with Plan of Care Patient           Patient will benefit from skilled therapeutic intervention in order to improve the following deficits and impairments:     Visit Diagnosis: Aftercare following surgery for neoplasm     Problem List Patient Active Problem List   Diagnosis Date Noted  . Genetic testing 11/14/2020  . Malignant neoplasm of upper-outer quadrant of right breast in female, estrogen receptor positive ([place]) 11/04/2020  . Family history of prostate cancer   . Family history of lung cancer   . Family history of kidney cancer   . Family history of stomach cancer   . Family history of breast cancer   . Family history of colon cancer   . Family history of pancreatic cancer   . Dysuria 06/09/2019  . Vaginal discharge 06/09/2019  . Urinary urgency 06/09/2019  . Mallet finger of left hand 12/06/2017  . Pain of left hand 12/06/2017  . Normal labor 06/22/2014  . SVD (spontaneous vaginal delivery) 06/22/2014  . Heart palpitations 09/10/2011  . ACUTE MAXILLARY SINUSITIS 08/27/2009    Otelia Limes, PTA 03/06/2021, 10:04 AM  [place], [place], [address] Phone: [phone]   Fax:  [phone]  Name: Destiny Mitchell MRN: [id number] Date of Birth: September 13, 1976

## 2021-03-27 ENCOUNTER — Other Ambulatory Visit (HOSPITAL_COMMUNITY): Payer: Self-pay

## 2021-03-27 MED FILL — Hydrochlorothiazide Tab 25 MG: ORAL | 30 days supply | Qty: 30 | Fill #1 | Status: AC

## 2021-03-28 ENCOUNTER — Encounter: Payer: Self-pay | Admitting: Radiology

## 2021-03-29 ENCOUNTER — Other Ambulatory Visit (HOSPITAL_COMMUNITY): Payer: Self-pay

## 2021-04-05 NOTE — Progress Notes (Incomplete)
  Radiation Oncology         [phone] ________________________________  Name: Destiny Mitchell MRN: [id number]  Date: 03/03/2021  DOB: 09-28-1976  End of Treatment Note  Diagnosis:   StageIA(pT1c, pN0, cM0)RightBreast UOQ,Invasive DuctalCarcinoma with DCIS, ER+/ PR+/ Her2-, Grade2     Indication for treatment:  Curative       Radiation treatment dates:   01/16/21-03/03/21    Narrative: The patient tolerated radiation treatment relatively well. She experienced some mild fatigue, tenderness to the breast, as well as on her back mirrored to treatment field, and skin redness. Her skin remained intact throughout.  Of note, she also developed shingles. She was treated with Valtrex starting 02/19/21.  Plan: The patient has completed radiation treatment. The patient will return to radiation oncology clinic for routine followup in one month. I advised them to call or return sooner if they have any questions or concerns related to their recovery or treatment.  -----------------------------------  Blair Promise, PhD, MD  This document serves as a record of services personally performed by Gery Pray, MD. It was created on his behalf by Roney Mans, a trained medical scribe. The creation of this record is based on the scribe's personal observations and the provider's statements to them. This document has been checked and approved by the attending provider.

## 2021-04-05 NOTE — Progress Notes (Signed)
Radiation Oncology         [phone] ________________________________  Name: Destiny Mitchell MRN: [id number]  Date: 04/06/2021  DOB: 03-11-1976  Follow-Up Visit Note  CC: [name], MD  Nicholas Lose, MD    ICD-10-CM   1. Malignant neoplasm of upper-outer quadrant of right breast in female, estrogen receptor positive ([place])  C50.411    Z17.0     Diagnosis:   StageIA(pT1c,pN0, cM0)RightBreast UOQ,Invasive DuctalCarcinoma with DCIS, ER+/ PR+/ Her2-, Grade2   Interval Since Last Radiation:  1 months  01/16/21 - 03/03/21    Narrative:  The patient returns today for routine follow-up.   On review of systems, she reports feeling well with return of her energy level to 100%.  She has noticed some numbness along the axillary and upper arm area which has been present since her surgery.  She denies any breast discomfort or tenderness at this time.  She started tamoxifen approximately 2 weeks ago and is tolerating this medication well.                        Allergies:  has No Known Allergies.  Meds: Current Outpatient Medications  Medication Sig Dispense Refill  . hydrochlorothiazide (HYDRODIURIL) 25 MG tablet TAKE 1 TABLET BY MOUTH DAILY 90 tablet 3  . levonorgestrel (LILETTA) 20.1 MCG/DAY IUD Liletta 20.1 mcg/24 hrs (6 yrs) 52 mg intrauterine device  Take 1 insert by intrauterine route.    Derrill Memo ON 04/17/2021] tamoxifen (NOLVADEX) 20 MG tablet Take 1 tablet (20 mg total) by mouth daily. 90 tablet 3   No current facility-administered medications for this encounter.    Physical Findings: The patient is in no acute distress. Patient is alert and oriented.  height is 5' 5.5" (1.664 m) and weight is 145 lb 6 oz (65.9 kg). Her temporal temperature is 97 F (36.1 C) (abnormal). Her blood pressure is 116/81 and her pulse is 67. Her respiration is 18 and oxygen saturation is 100%. .  No significant changes. Lungs are clear to auscultation bilaterally. Heart has regular  rate and rhythm. No palpable cervical, supraclavicular, or axillary adenopathy. Abdomen soft, non-tender, normal bowel sounds. Left breast: no palpable mass nipple discharge or bleeding. Right breast: skin is healed well.  Very mild hyperpigmentation changes in the breast area.  Mild edema throughout the breast most notable in the nipple areolar complex area.  Some induration along the lumpectomy scar consistent with surgery and radiation effect.  No dominant mass appreciated in the breast, nipple discharge or bleeding.  Very mild skin changes in the inferior aspect of the breast related to her prior shingles.  Lab Findings: Lab Results  Component Value Date   WBC 4.3 04/07/2018   HGB 13.1 04/07/2018   HCT 39.6 04/07/2018   MCV 81 04/07/2018   PLT 219 04/07/2018    Radiographic Findings: No results found.  Impression:  StageIA(pT1c,pN0, cM0)RightBreast UOQ,Invasive DuctalCarcinoma with DCIS, ER+/ PR+/ Her2-, Grade2   The patient has recovered well from her radiation therapy.  No signs of recurrence on clinical exam today.  She is tolerating tamoxifen extremely well at this time.  Plan: As needed follow-up in radiation oncology.  The patient will continue close follow-up with medical oncology and surgery.  She will continue on tamoxifen as recommended by Dr. Lindi Adie.    ____________________________________  Blair Promise, PhD, MD   This document serves as a record of services personally performed by Gery Pray, MD. It was created  on his behalf by Roney Mans, a trained medical scribe. The creation of this record is based on the scribe's personal observations and the provider's statements to them. This document has been checked and approved by the attending provider.

## 2021-04-06 ENCOUNTER — Other Ambulatory Visit: Payer: Self-pay

## 2021-04-06 ENCOUNTER — Ambulatory Visit
Admission: RE | Admit: 2021-04-06 | Discharge: 2021-04-06 | Disposition: A | Discharge status: Home / Self Care | Payer: BLUE CROSS/BLUE SHIELD | Source: Ambulatory Visit | Attending: Radiation Oncology | Admitting: Radiation Oncology

## 2021-04-06 ENCOUNTER — Encounter: Payer: Self-pay | Admitting: Radiation Oncology

## 2021-04-06 VITALS — BP 116/81 | HR 67 | Temp 97.0°F | Resp 18 | Ht 65.5 in | Wt 145.4 lb

## 2021-04-06 DIAGNOSIS — M79621 Pain in right upper arm: Secondary | ICD-10-CM | POA: Insufficient documentation

## 2021-04-06 DIAGNOSIS — C50411 Malignant neoplasm of upper-outer quadrant of right female breast: Secondary | ICD-10-CM | POA: Diagnosis present

## 2021-04-06 DIAGNOSIS — Z79899 Other long term (current) drug therapy: Secondary | ICD-10-CM | POA: Diagnosis not present

## 2021-04-06 DIAGNOSIS — Z170 Estrogen receptor positive status [ER+]: Secondary | ICD-10-CM | POA: Diagnosis not present

## 2021-04-06 DIAGNOSIS — Z923 Personal history of irradiation: Secondary | ICD-10-CM | POA: Diagnosis not present

## 2021-04-06 DIAGNOSIS — Z79810 Long term (current) use of selective estrogen receptor modulators (SERMs): Secondary | ICD-10-CM | POA: Diagnosis not present

## 2021-04-06 NOTE — Progress Notes (Signed)
She is currently in no pain.   Pt denies fatigue, weakness, loss of sleep and poor appetite  Pt right breast- negative for erythema, breast tenderness, nipple discharge, breast lump, moist desquamation and dry desquamation.    Pt denies edema over right upper extremity.   Pt continues to apply Dermavitality Radiation Relief Cream as directed. as directed.  BP 116/81 (BP Location: Left Arm, Patient Position: Sitting)   Pulse 67   Temp (!) 97 F (36.1 C) (Temporal)   Resp 18   Ht 5' 5.5" (1.664 m)   Wt 145 lb 6 oz (65.9 kg)   SpO2 100%   BMI 23.82 kg/m   Pt reports Yes No Comments  Tamoxifen [x]  []    Letrozole []  [x]    Arimidex []  [x]    Mammogram []  Date:  []     Patient denies further complaints at this time.

## 2021-04-25 ENCOUNTER — Telehealth: Payer: Self-pay | Admitting: Hematology and Oncology

## 2021-04-25 NOTE — Telephone Encounter (Signed)
R/s appts per 5/31 sch msg. Pt aware.  

## 2021-05-01 ENCOUNTER — Other Ambulatory Visit (HOSPITAL_COMMUNITY): Payer: Self-pay

## 2021-05-01 MED FILL — Hydrochlorothiazide Tab 25 MG: ORAL | 30 days supply | Qty: 30 | Fill #2 | Status: AC

## 2021-05-25 ENCOUNTER — Other Ambulatory Visit (HOSPITAL_BASED_OUTPATIENT_CLINIC_OR_DEPARTMENT_OTHER): Payer: Self-pay

## 2021-05-25 MED FILL — Hydrochlorothiazide Tab 25 MG: ORAL | 30 days supply | Qty: 30 | Fill #0 | Status: AC

## 2021-06-02 ENCOUNTER — Ambulatory Visit: Payer: BLUE CROSS/BLUE SHIELD | Admitting: Hematology and Oncology

## 2021-06-05 ENCOUNTER — Telehealth: Payer: Self-pay | Admitting: Hematology and Oncology

## 2021-06-05 ENCOUNTER — Ambulatory Visit: Payer: BLUE CROSS/BLUE SHIELD | Attending: General Surgery

## 2021-06-05 ENCOUNTER — Other Ambulatory Visit: Payer: Self-pay

## 2021-06-05 DIAGNOSIS — Z483 Aftercare following surgery for neoplasm: Secondary | ICD-10-CM | POA: Insufficient documentation

## 2021-06-05 NOTE — Therapy (Addendum)
[place], [place], [address] Phone: [phone]   Fax:  [phone]  Physical Therapy Treatment  Patient Details  Name: Destiny Mitchell MRN: [id number] Date of Birth: August 26, 1976 Referring Provider (PT): Dr. Rolm Bookbinder   Encounter Date: 06/05/2021   PT End of Session - 06/05/21 1002     Visit Number 6   # unchanged due to screen only   PT Start Time 6237    PT Stop Time 1003    PT Time Calculation (min) 9 min    Activity Tolerance Patient tolerated treatment well    Behavior During Therapy [facility] for tasks assessed/performed             Past Medical History:  Diagnosis Date   Anxiety attack    Back pain    Chronic kidney disease    Family history of breast cancer    Family history of colon cancer    Family history of kidney cancer    Family history of lung cancer    Family history of pancreatic cancer    Family history of prostate cancer    Family history of stomach cancer    History of radiation therapy 01/16/2021-03/03/2021   IMRT to right breast    Dr Gery Pray   Palpitations    Pre-eclampsia, postpartum    Sinus tachycardia    SVD (spontaneous vaginal delivery) 06/22/2014   Syncope and collapse    Ureter obstruction    s/p surgery in 2009    Past Surgical History:  Procedure Laterality Date   BREAST LUMPECTOMY WITH RADIOACTIVE SEED AND SENTINEL LYMPH NODE BIOPSY Right 12/01/2020   Procedure: RIGHT BREAST LUMPECTOMY WITH RADIOACTIVE SEED AND RIGHT AXILLARY SENTINEL LYMPH NODE BIOPSY;  Surgeon: Rolm Bookbinder, MD;  Location: [facility];  Service: General;  Laterality: Right;  PEC BLOCK   KIDNEY SURGERY  July 2009   Blocked ureter after pregnancy   US ECHOCARDIOGRAPHY  01/01/2005   EF 55-60%. NORMAL   WISDOM TOOTH EXTRACTION      There were no vitals filed for this visit.   Subjective Assessment - 06/05/21 0959     Subjective Pt returns for her 3 month  L-Dex screen.    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 Patient underwent a right lumpectomy and sentinel node biopsy (2 negative nodes) on 12/01/2020. It is ER/PR positive and HER2 negative with a Ki67 of 10%.                    L-DEX FLOWSHEETS - 06/05/21 0900       L-DEX LYMPHEDEMA SCREENING   Measurement Type Unilateral    L-DEX MEASUREMENT EXTREMITY Upper Extremity    POSITION  Standing    DOMINANT SIDE Right    At Risk Side Right    BASELINE SCORE (UNILATERAL) -0.5    L-DEX SCORE (UNILATERAL) -2.2    VALUE CHANGE (UNILAT) -1.7                                    PT Long Term Goals - 12/19/20 1212       PT LONG TERM GOAL #1   Title Patient will demonstrate she has regained full shoulder ROM and function post operatively compared to baselines.    Time 4    Period Weeks  Status On-going    Target Date 01/16/21      PT LONG TERM GOAL #2   Title Patient will increase right shoulder flexion to >/= 150 degrees for increased ease reaching.    Baseline 125 degrees    Time 4    Period Weeks    Status New    Target Date 01/16/21      PT LONG TERM GOAL #3   Title Patient will increase right shoulder abduction to >/= 160 degrees for increased ease reaching.    Baseline 130 degrees    Time 4    Period Weeks    Status New    Target Date 01/16/21      PT LONG TERM GOAL #4   Title Patient will report good understanding of lymphedema risk reduction practices.    Time 4    Period Weeks    Status New    Target Date 01/16/21      PT LONG TERM GOAL #5   Title Patient will report >/= 50% improvement in axillary cording with less restriction during end range abduction.    Time 4    Period Weeks    Status New    Target Date 01/16/21                   Plan - 06/05/21 1003     Clinical Impression Statement Pt returns for her 3 month L-Dex screen. Her chnage  from baseline of -1.7 is WNLs so no further treatment is required at this time except to cont every 3 month L-Dex screens which pt is agreeable to. Pt reports her Rt breast has been fuller heavy since radiation ended so started wearing her compression bra again recently. She sees Dr. Lindi Adie this week for a follow up visit. Encouraged her to speak with him about possible breast lymphedema so we can begin treatment for this and pt verablized understanding and agreed.    PT Next Visit Plan Cont every 3 month L-Dex screens for up to 2 years from her SLNB.    Consulted and Agree with Plan of Care Patient             Patient will benefit from skilled therapeutic intervention in order to improve the following deficits and impairments:     Visit Diagnosis: Aftercare following surgery for neoplasm     Problem List Patient Active Problem List   Diagnosis Date Noted   Genetic testing 11/14/2020   Malignant neoplasm of upper-outer quadrant of right breast in female, estrogen receptor positive ([place]) 11/04/2020   Family history of prostate cancer    Family history of lung cancer    Family history of kidney cancer    Family history of stomach cancer    Family history of breast cancer    Family history of colon cancer    Family history of pancreatic cancer    Dysuria 06/09/2019   Vaginal discharge 06/09/2019   Urinary urgency 06/09/2019   Mallet finger of left hand 12/06/2017   Pain of left hand 12/06/2017   Normal labor 06/22/2014   SVD (spontaneous vaginal delivery) 06/22/2014   Heart palpitations 09/10/2011   ACUTE MAXILLARY SINUSITIS 08/27/2009    Otelia Limes, PTA 06/05/2021, 11:03 AM  [place], [place], [address] Phone: [phone]   Fax:  [phone]  Name: Destiny Mitchell MRN: [id number] Date of Birth: 08-01-1976

## 2021-06-05 NOTE — Telephone Encounter (Signed)
Rescheduled per 7/11 email. Called and spoke with pt to confirmed appt time change

## 2021-06-06 NOTE — Progress Notes (Signed)
Patient Care Team: [name], MD as PCP - General (Internal Medicine)  DIAGNOSIS:    ICD-10-CM   1. Malignant neoplasm of upper-outer quadrant of right breast in female, estrogen receptor positive ([place])  C50.411    Z17.0       SUMMARY OF ONCOLOGIC HISTORY: Oncology History  Malignant neoplasm of upper-outer quadrant of right breast in female, estrogen receptor positive ([place])  10/31/2020 Initial Diagnosis    Screening detected right breast mass 10:30 position 1.7 cm by ultrasound, numerous benign-appearing cysts largest measuring 1.3 cm.  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   11/04/2020 Cancer Staging   Staging form: Breast, AJCC 8th Edition - Clinical: Stage IA (cT1c, cN0, cM0, G2, ER+, PR+, HER2-) - Signed by Nicholas Lose, MD on 11/04/2020    11/11/2020 Genetic Testing   Negative genetic testing on the STAT and Multicancer panel.  The Multi-Gene Panel offered by [facility] includes sequencing and/or deletion duplication testing of the following 85 genes: AIP, ALK, APC, ATM, AXIN2,BAP1,  BARD1, BLM, BMPR1A, BRCA1, BRCA2, BRIP1, CASR, CDC73, CDH1, CDK4, CDKN1B, CDKN1C, CDKN2A (p14ARF), CDKN2A (p16INK4a), CEBPA, CHEK2, CTNNA1, DICER1, DIS3L2, EGFR (c.2369C>T, p.Thr790Met variant only), EPCAM (Deletion/duplication testing only), FH, FLCN, GATA2, GPC3, GREM1 (Promoter region deletion/duplication testing only), HOXB13 (c.251G>A, p.Gly84Glu), HRAS, KIT, MAX, MEN1, MET, MITF (c.952G>A, p.Glu318Lys variant only), MLH1, MSH2, MSH3, MSH6, MUTYH, NBN, NF1, NF2, NTHL1, PALB2, PDGFRA, PHOX2B, PMS2, POLD1, POLE, POT1, PRKAR1A, PTCH1, PTEN, RAD50, RAD51C, RAD51D, RB1, RECQL4, RET, RNF43, RUNX1, SDHAF2, SDHA (sequence changes only), SDHB, SDHC, SDHD, SMAD4, SMARCA4, SMARCB1, SMARCE1, STK11, SUFU, TERC, TERT, TMEM127, TP53, TSC1, TSC2, VHL, WRN and WT1.  The report date is 11/13/2020.   12/01/2020 Surgery    Right lumpectomy [facility]): invasive and in situ ductal carcinoma, 1.6cm, grade 2, involved posterior margin, 2 right axillary lymph nodes negative for carcinoma.   12/16/2020 Oncotype testing   Oncotype DX score: 5; risk of distant recurrence at 9 years: 3%   01/17/2021 - 03/03/2021 Radiation Therapy   Adjuvant radiation    03/15/2021 -  Anti-estrogen oral therapy   Tamoxifen 20 mg daily     CHIEF COMPLIANT: Follow-up of right breast cancer  INTERVAL HISTORY: Destiny Mitchell is a 45 y.o. with above-mentioned history of right breast cancer having undergone a lumpectomy and radiation therapy, currently on antiestrogen therapy with tamoxifen. She reports to the clinic today for follow-up and to discuss a survivorship care plan.  She is tolerating tamoxifen extremely well without any major problems or concerns.  She does have occasional hot flashes but they are not unbearable.  ALLERGIES:  has No Known Allergies.  MEDICATIONS:  Current Outpatient Medications  Medication Sig Dispense Refill   hydrochlorothiazide (HYDRODIURIL) 25 MG tablet TAKE 1 TABLET BY MOUTH DAILY 90 tablet 3   levonorgestrel (LILETTA) 20.1 MCG/DAY IUD Liletta 20.1 mcg/24 hrs (6 yrs) 52 mg intrauterine device  Take 1 insert by intrauterine route.     tamoxifen (NOLVADEX) 20 MG tablet Take 1 tablet (20 mg total) by mouth daily. 90 tablet 3   No current facility-administered medications for this visit.    PHYSICAL EXAMINATION: ECOG PERFORMANCE STATUS: 1 - Symptomatic but completely ambulatory  Vitals:   06/07/21 1201  BP: 135/83  Pulse: 68  Resp: 18  Temp: 97.9 F (36.6 C)  SpO2: 100%   Filed Weights   06/07/21 1201  Weight: 146 lb 9.6 oz (66.5 kg)  LABORATORY DATA:  I have reviewed the data as listed CMP Latest Ref Rng & Units 11/24/2020 04/07/2018 04/04/2017  Glucose 70 - 99 mg/dL 110(H) 86 -  BUN 6 - 20 mg/dL 23(H) 17 -  Creatinine 0.44 - 1.00 mg/dL 0.81 0.92 -  Sodium 135 - 145 mmol/L 137  139 -  Potassium 3.5 - 5.1 mmol/L 3.2(L) 4.0 -  Chloride 98 - 111 mmol/L 101 100 -  CO2 22 - 32 mmol/L 26 25 -  Calcium 8.9 - 10.3 mg/dL 9.8 9.2 -  Total Protein 6.0 - 8.5 g/dL - 7.1 7.1  Total Bilirubin 0.0 - 1.2 mg/dL - 0.6 0.5  Alkaline Phos 39 - 117 IU/L - 64 72  AST 0 - 40 IU/L - 17 17  ALT 0 - 32 IU/L - 13 9    Lab Results  Component Value Date   WBC 4.3 04/07/2018   HGB 13.1 04/07/2018   HCT 39.6 04/07/2018   MCV 81 04/07/2018   PLT 219 04/07/2018   NEUTROABS 2.7 04/07/2018    ASSESSMENT & PLAN:  Malignant neoplasm of upper-outer quadrant of right breast in female, estrogen receptor positive ([facility]) 12/01/2020:Right lumpectomy ([place]): invasive and in situ ductal carcinoma, 1.6cm, grade 2, involved posterior margin, 2 right axillary lymph nodes negative for carcinoma.  ER greater than 95%, PR greater than 95%, Ki-67 10%, HER-2 negative Oncotype DX score: 5; risk of distant recurrence at 9 years: 3% Adjuvant radiation 01/17/2021-03/03/2021   Treatment plan: Tamoxifen 20 mg daily started 03/17/2021 Tamoxifen Toxicities: Denies any major adverse effects with tamoxifen.  She is tolerating it fairly well.  Breast Cancer Surveillance: Mammograms to be done in November 2022 Breast MRIs will be done in June 2023  RTC in 3 months for survivorship care plan visit.  After that I can see her in 1 year.    No orders of the defined types were placed in this encounter.  The patient has a good understanding of the overall plan. she agrees with it. she will call with any problems that may develop before the next visit here.  Total time spent: 20 mins including face to face time and time spent for planning, charting and coordination of care  Vinay K Gudena, MD, MPH 06/07/2021  I, Kirstyn Evans, am acting as scribe for Dr. Vinay Gudena.  I have reviewed the above documentation for accuracy and completeness, and I agree with the above.       

## 2021-06-06 NOTE — Assessment & Plan Note (Signed)
12/01/2020:Right lumpectomy Destiny Mitchell): invasive and in situ ductal carcinoma, 1.6cm, grade 2, involved posterior margin, 2 right axillary lymph nodes negative for carcinoma.  ER greater than 95%, PR greater than 95%, Ki-67 10%, HER-2 negative Oncotype DX score: 5; risk of distant recurrence at 9 years: 3% Adjuvant radiation 01/17/2021-03/03/2021  Treatment plan: Tamoxifen 20 mg daily started 03/17/2021 Tamoxifen Toxicities:  Breast Cancer Surveillance  RTC in 1 year

## 2021-06-07 ENCOUNTER — Inpatient Hospital Stay: Payer: BLUE CROSS/BLUE SHIELD | Attending: Hematology and Oncology | Admitting: Hematology and Oncology

## 2021-06-07 ENCOUNTER — Ambulatory Visit: Payer: BLUE CROSS/BLUE SHIELD | Admitting: Hematology and Oncology

## 2021-06-07 ENCOUNTER — Other Ambulatory Visit: Payer: Self-pay

## 2021-06-07 DIAGNOSIS — Z170 Estrogen receptor positive status [ER+]: Secondary | ICD-10-CM | POA: Insufficient documentation

## 2021-06-07 DIAGNOSIS — Z793 Long term (current) use of hormonal contraceptives: Secondary | ICD-10-CM | POA: Diagnosis not present

## 2021-06-07 DIAGNOSIS — C50411 Malignant neoplasm of upper-outer quadrant of right female breast: Secondary | ICD-10-CM | POA: Insufficient documentation

## 2021-06-07 DIAGNOSIS — Z923 Personal history of irradiation: Secondary | ICD-10-CM | POA: Diagnosis not present

## 2021-06-07 DIAGNOSIS — Z79899 Other long term (current) drug therapy: Secondary | ICD-10-CM | POA: Insufficient documentation

## 2021-06-07 DIAGNOSIS — Z79810 Long term (current) use of selective estrogen receptor modulators (SERMs): Secondary | ICD-10-CM | POA: Diagnosis not present

## 2021-06-30 ENCOUNTER — Other Ambulatory Visit (HOSPITAL_BASED_OUTPATIENT_CLINIC_OR_DEPARTMENT_OTHER): Payer: Self-pay

## 2021-06-30 MED FILL — Hydrochlorothiazide Tab 25 MG: ORAL | 30 days supply | Qty: 30 | Fill #1 | Status: AC

## 2021-07-05 ENCOUNTER — Other Ambulatory Visit: Payer: Self-pay | Admitting: Adult Health

## 2021-07-05 DIAGNOSIS — C50411 Malignant neoplasm of upper-outer quadrant of right female breast: Secondary | ICD-10-CM

## 2021-07-05 DIAGNOSIS — Z170 Estrogen receptor positive status [ER+]: Secondary | ICD-10-CM

## 2021-07-07 ENCOUNTER — Other Ambulatory Visit (HOSPITAL_BASED_OUTPATIENT_CLINIC_OR_DEPARTMENT_OTHER): Payer: Self-pay

## 2021-07-13 ENCOUNTER — Other Ambulatory Visit (HOSPITAL_BASED_OUTPATIENT_CLINIC_OR_DEPARTMENT_OTHER): Payer: Self-pay

## 2021-08-01 ENCOUNTER — Other Ambulatory Visit (HOSPITAL_BASED_OUTPATIENT_CLINIC_OR_DEPARTMENT_OTHER): Payer: Self-pay

## 2021-08-01 MED ORDER — HYDROCHLOROTHIAZIDE 25 MG PO TABS
25.0000 mg | ORAL_TABLET | Freq: Every day | ORAL | 3 refills | Status: DC
  Filled 2021-08-01: qty 90, 90d supply, fill #0
  Filled 2021-10-08: qty 90, 90d supply, fill #1
  Filled 2022-02-06: qty 90, 90d supply, fill #2
  Filled 2022-04-30: qty 90, 90d supply, fill #3

## 2021-08-15 ENCOUNTER — Other Ambulatory Visit (HOSPITAL_BASED_OUTPATIENT_CLINIC_OR_DEPARTMENT_OTHER): Payer: Self-pay

## 2021-08-17 ENCOUNTER — Other Ambulatory Visit (HOSPITAL_BASED_OUTPATIENT_CLINIC_OR_DEPARTMENT_OTHER): Payer: Self-pay

## 2021-08-17 MED ORDER — ALPRAZOLAM 0.5 MG PO TABS
ORAL_TABLET | ORAL | 0 refills | Status: DC
  Filled 2021-08-17: qty 20, 30d supply, fill #0

## 2021-08-24 ENCOUNTER — Telehealth: Payer: Self-pay | Admitting: Adult Health

## 2021-08-24 NOTE — Telephone Encounter (Signed)
Per 9/28 schedule msg, appt was cancelled, msg left with pt

## 2021-08-28 ENCOUNTER — Telehealth: Payer: Self-pay | Admitting: *Deleted

## 2021-09-04 ENCOUNTER — Ambulatory Visit: Payer: BLUE CROSS/BLUE SHIELD | Attending: General Surgery

## 2021-09-04 DIAGNOSIS — Z483 Aftercare following surgery for neoplasm: Secondary | ICD-10-CM | POA: Insufficient documentation

## 2021-09-07 ENCOUNTER — Encounter: Payer: BLUE CROSS/BLUE SHIELD | Admitting: Adult Health

## 2021-10-09 ENCOUNTER — Other Ambulatory Visit (HOSPITAL_BASED_OUTPATIENT_CLINIC_OR_DEPARTMENT_OTHER): Payer: Self-pay

## 2021-10-10 ENCOUNTER — Other Ambulatory Visit (HOSPITAL_BASED_OUTPATIENT_CLINIC_OR_DEPARTMENT_OTHER): Payer: Self-pay

## 2021-10-10 MED ORDER — OSELTAMIVIR PHOSPHATE 75 MG PO CAPS
ORAL_CAPSULE | ORAL | 0 refills | Status: DC
  Filled 2021-10-10: qty 10, 5d supply, fill #0

## 2021-10-16 ENCOUNTER — Other Ambulatory Visit (HOSPITAL_BASED_OUTPATIENT_CLINIC_OR_DEPARTMENT_OTHER): Payer: Self-pay

## 2021-10-16 MED ORDER — AMOXICILLIN-POT CLAVULANATE 875-125 MG PO TABS
ORAL_TABLET | ORAL | 0 refills | Status: DC
  Filled 2021-10-16: qty 14, 7d supply, fill #0

## 2021-10-25 ENCOUNTER — Ambulatory Visit
Admission: RE | Admit: 2021-10-25 | Discharge: 2021-10-25 | Disposition: A | Discharge status: Home / Self Care | Payer: No Typology Code available for payment source | Source: Ambulatory Visit | Attending: Hematology and Oncology | Admitting: Hematology and Oncology

## 2021-10-25 ENCOUNTER — Other Ambulatory Visit: Payer: Self-pay

## 2021-10-25 DIAGNOSIS — C50411 Malignant neoplasm of upper-outer quadrant of right female breast: Secondary | ICD-10-CM

## 2021-10-25 HISTORY — DX: Personal history of irradiation: Z92.3

## 2021-11-08 ENCOUNTER — Other Ambulatory Visit (HOSPITAL_BASED_OUTPATIENT_CLINIC_OR_DEPARTMENT_OTHER): Payer: Self-pay

## 2021-11-08 MED ORDER — AZITHROMYCIN 250 MG PO TABS
ORAL_TABLET | ORAL | 0 refills | Status: DC
  Filled 2021-11-08: qty 6, 5d supply, fill #0

## 2021-11-08 MED ORDER — HYDROCODONE BIT-HOMATROP MBR 5-1.5 MG/5ML PO SOLN
ORAL | 0 refills | Status: DC
  Filled 2021-11-08: qty 140, 7d supply, fill #0

## 2021-11-16 ENCOUNTER — Telehealth: Payer: Self-pay | Admitting: *Deleted

## 2021-12-01 ENCOUNTER — Other Ambulatory Visit (HOSPITAL_BASED_OUTPATIENT_CLINIC_OR_DEPARTMENT_OTHER): Payer: Self-pay

## 2021-12-04 ENCOUNTER — Other Ambulatory Visit: Payer: Self-pay

## 2021-12-04 ENCOUNTER — Encounter: Payer: Self-pay | Admitting: Physical Therapy

## 2021-12-04 ENCOUNTER — Ambulatory Visit: Payer: 59 | Attending: General Surgery | Admitting: Physical Therapy

## 2021-12-04 DIAGNOSIS — C50411 Malignant neoplasm of upper-outer quadrant of right female breast: Secondary | ICD-10-CM | POA: Insufficient documentation

## 2021-12-04 DIAGNOSIS — Z170 Estrogen receptor positive status [ER+]: Secondary | ICD-10-CM | POA: Insufficient documentation

## 2021-12-04 NOTE — Therapy (Signed)
[facility] @ [facility] [address], [place], [address] Phone: [phone]   Fax:  [phone]  Physical Therapy Treatment  Patient Details  Name: Destiny Mitchell MRN: [id number] Date of Birth: 1976-04-11 Referring Provider (PT): Dr. Rolm Bookbinder   Encounter Date: 12/04/2021   PT End of Session - 12/04/21 1117     Visit Number 6   # unchanged due to screen only   PT Start Time 2    PT Stop Time 1116    PT Time Calculation (min) 5 min    Activity Tolerance Patient tolerated treatment well    Behavior During Therapy [facility] for tasks assessed/performed             Past Medical History:  Diagnosis Date   Anxiety attack    Back pain    Chronic kidney disease    Family history of breast cancer    mom at age 58   Family history of colon cancer    Family history of kidney cancer    Family history of lung cancer    Family history of pancreatic cancer    Family history of prostate cancer    Family history of stomach cancer    History of radiation therapy 01/16/2021-03/03/2021   IMRT to right breast    Dr Gery Pray   Palpitations    Personal history of radiation therapy    Pre-eclampsia, postpartum    Sinus tachycardia    SVD (spontaneous vaginal delivery) 06/22/2014   Syncope and collapse    Ureter obstruction    s/p surgery in 2009    Past Surgical History:  Procedure Laterality Date   BREAST LUMPECTOMY Left 12/01/2020   w/ radiation. no chemo. started tamoxifen   BREAST LUMPECTOMY WITH RADIOACTIVE SEED AND SENTINEL LYMPH NODE BIOPSY Right 12/01/2020   Procedure: RIGHT BREAST LUMPECTOMY WITH RADIOACTIVE SEED AND RIGHT AXILLARY SENTINEL LYMPH NODE BIOPSY;  Surgeon: Rolm Bookbinder, MD;  Location: [facility];  Service: General;  Laterality: Right;  PEC BLOCK   KIDNEY SURGERY  05/26/2008   Blocked ureter after pregnancy   US ECHOCARDIOGRAPHY  01/01/2005   EF 55-60%. NORMAL   WISDOM TOOTH  EXTRACTION      There were no vitals filed for this visit.   Subjective Assessment - 12/04/21 1110     Subjective I am doing good. Pt returns for her 3 month L-Dex screen.    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 Patient underwent a right lumpectomy and sentinel node biopsy (2 negative nodes) on 12/01/2020. It is ER/PR positive and HER2 negative with a Ki67 of 10%.    Patient Stated Goals See if my arm is doing ok    Currently in Pain? No/denies    Pain Score 0-No pain                    L-DEX FLOWSHEETS - 12/04/21 1100       L-DEX LYMPHEDEMA SCREENING   Measurement Type Unilateral    L-DEX MEASUREMENT EXTREMITY Upper Extremity    POSITION  Standing    DOMINANT SIDE Right    At Risk Side Right    BASELINE SCORE (UNILATERAL) -0.5    L-DEX SCORE (UNILATERAL) -1.2    VALUE CHANGE (UNILAT) -0.7  PT Long Term Goals - 12/19/20 1212       PT LONG TERM GOAL #1   Title Patient will demonstrate she has regained full shoulder ROM and function post operatively compared to baselines.    Time 4    Period Weeks    Status On-going    Target Date 01/16/21      PT LONG TERM GOAL #2   Title Patient will increase right shoulder flexion to >/= 150 degrees for increased ease reaching.    Baseline 125 degrees    Time 4    Period Weeks    Status New    Target Date 01/16/21      PT LONG TERM GOAL #3   Title Patient will increase right shoulder abduction to >/= 160 degrees for increased ease reaching.    Baseline 130 degrees    Time 4    Period Weeks    Status New    Target Date 01/16/21      PT LONG TERM GOAL #4   Title Patient will report good understanding of lymphedema risk reduction practices.    Time 4    Period Weeks    Status New    Target Date 01/16/21      PT LONG TERM GOAL #5   Title Patient will report >/= 50% improvement in  axillary cording with less restriction during end range abduction.    Time 4    Period Weeks    Status New    Target Date 01/16/21                   Plan - 12/04/21 1117     Clinical Impression Statement Pt returns for her 3 month L-Dex screen. Her change from baseline of -0.7 is WNL so no further treatment is required at this time except to continue every 3 month L-Dex screens for the first 2 years after surgery which pt is agreeable to.    PT Next Visit Plan Cont every 3 month L-Dex screens for up to 2 years from her SLNB.    Consulted and Agree with Plan of Care Patient             Patient will benefit from skilled therapeutic intervention in order to improve the following deficits and impairments:     Visit Diagnosis: Malignant neoplasm of upper-outer quadrant of right breast in female, estrogen receptor positive [facility])     Problem List Patient Active Problem List   Diagnosis Date Noted   Genetic testing 11/14/2020   Malignant neoplasm of upper-outer quadrant of right breast in female, estrogen receptor positive ([place]) 11/04/2020   Family history of prostate cancer    Family history of lung cancer    Family history of kidney cancer    Family history of stomach cancer    Family history of breast cancer    Family history of colon cancer    Family history of pancreatic cancer    Dysuria 06/09/2019   Vaginal discharge 06/09/2019   Urinary urgency 06/09/2019   Mallet finger of left hand 12/06/2017   Pain of left hand 12/06/2017   Normal labor 06/22/2014   SVD (spontaneous vaginal delivery) 06/22/2014   Heart palpitations 09/10/2011   ACUTE MAXILLARY SINUSITIS 08/27/2009    Allyson Sabal [place], PT 12/04/2021, 11:21 AM  [facility] @ [address], [place], [address] Phone: [phone]   Fax:  [phone]  Name: Destiny Mitchell MRN:  [id number] Date of Birth: 02-Jun-1976

## 2021-12-08 ENCOUNTER — Other Ambulatory Visit: Payer: Self-pay

## 2021-12-08 ENCOUNTER — Encounter: Payer: Self-pay | Admitting: *Deleted

## 2021-12-08 ENCOUNTER — Inpatient Hospital Stay: Payer: 59 | Attending: Hematology and Oncology | Admitting: *Deleted

## 2021-12-08 VITALS — BP 122/79 | HR 64 | Temp 97.8°F | Resp 18 | Ht 66.0 in | Wt 146.3 lb

## 2021-12-08 DIAGNOSIS — Z170 Estrogen receptor positive status [ER+]: Secondary | ICD-10-CM

## 2021-12-08 DIAGNOSIS — C50411 Malignant neoplasm of upper-outer quadrant of right female breast: Secondary | ICD-10-CM

## 2021-12-08 NOTE — Progress Notes (Signed)
°  Reviewed and completed SCP visit with patient. SDOH assessment completed. Pt is doing very well. Vital signs were normal. Pt is doing very well on Tamoxifen with very few side effects. She has an occasional hot flash. Pt last saw her PCP in Sept,2022 and GYN in Nov,2022. Pt also had her flu vaccine in Sept,2022. No other vaccines to report. Pt will continue to see dermatologist twice a year to check her skin. Next Mammo will be scheduled for November 2023. I will put order in today.

## 2021-12-10 IMAGING — MG DIGITAL DIAGNOSTIC BILAT W/ TOMO W/ CAD
6 of 11 series · 6 of 31 positions shown · non-contrast
Comparison: Previous exam(s).

CLINICAL DATA: Status post right lumpectomy and radiation therapy
for breast cancer in November 2020. She is currently taking
tamoxifen.

EXAM:
DIGITAL DIAGNOSTIC BILATERAL MAMMOGRAM WITH TOMOSYNTHESIS AND CAD
TECHNIQUE: Bilateral digital diagnostic mammography and breast tomosynthesis
was performed. The images were evaluated with computer-aided
detection.

[R MLO]
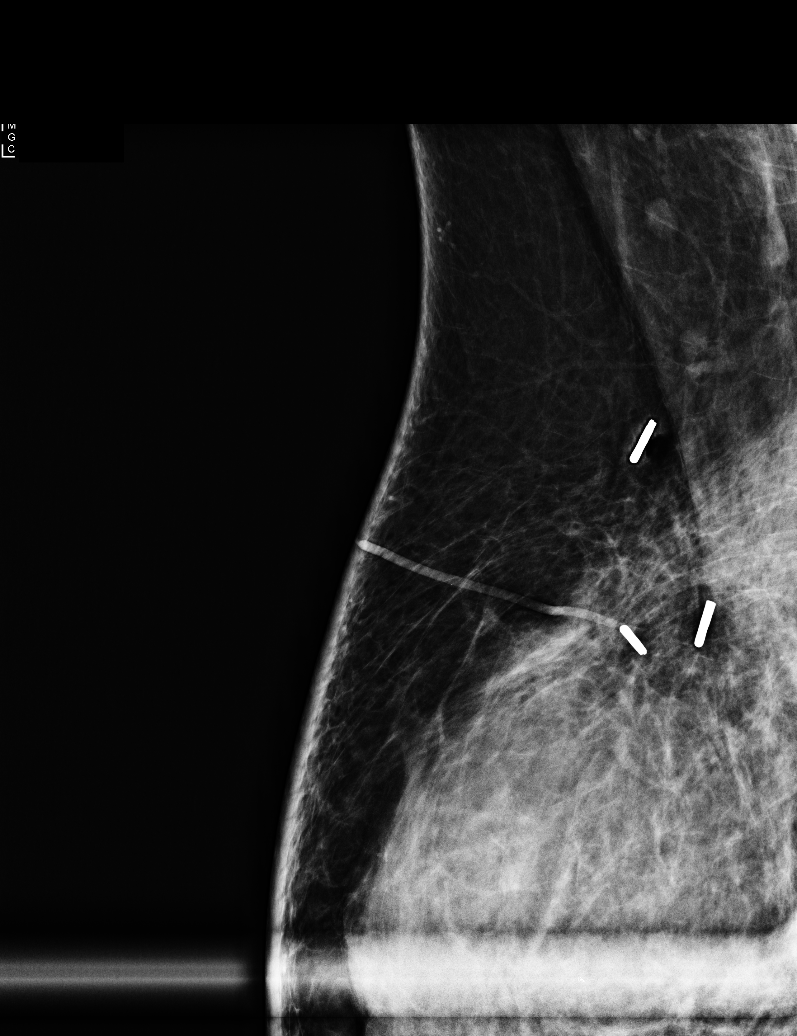

[L CC synth-2D]
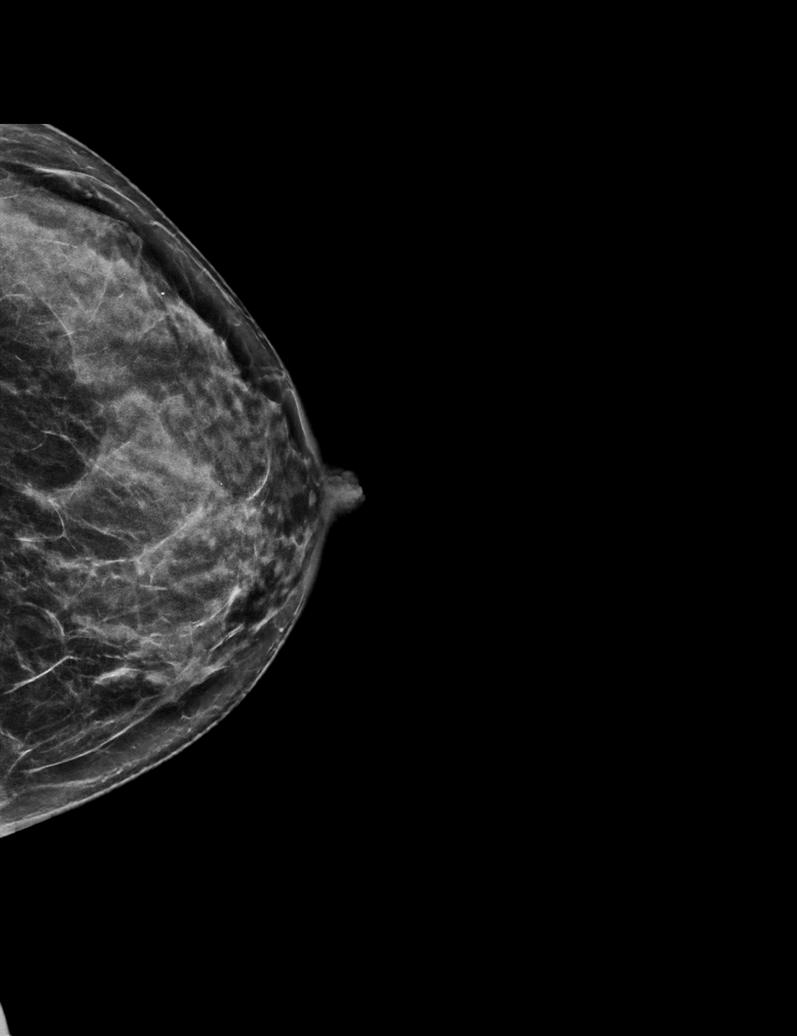

[R MLO synth-2D]
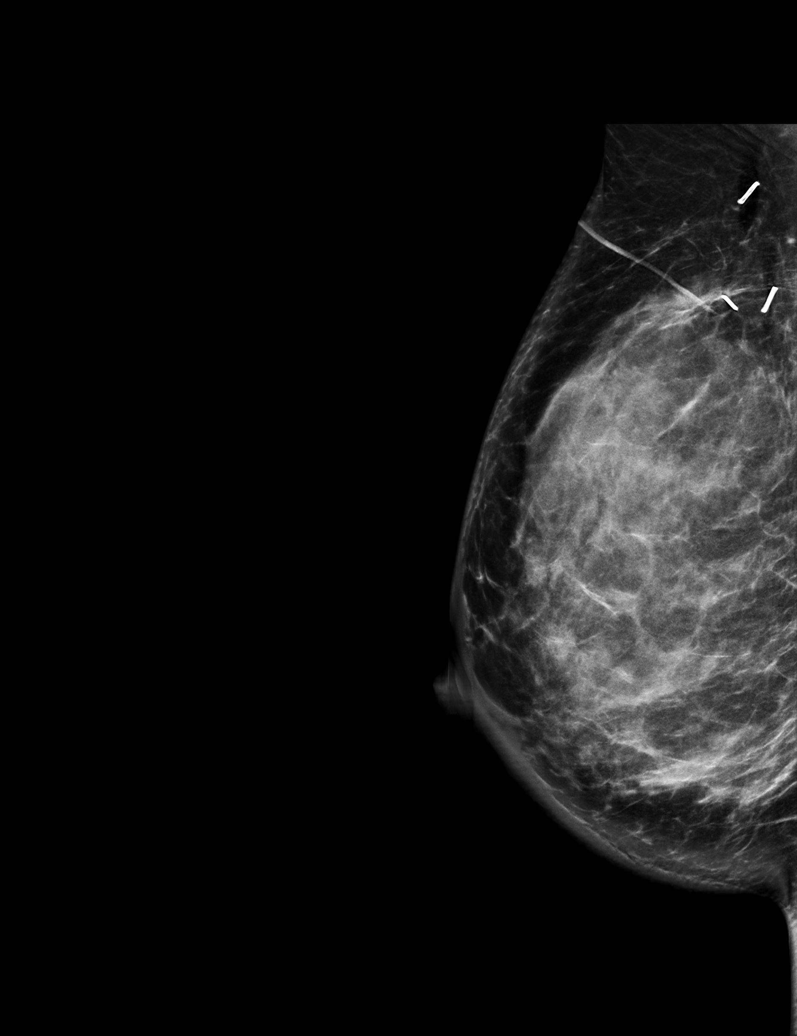

[R XCCL synth-2D]
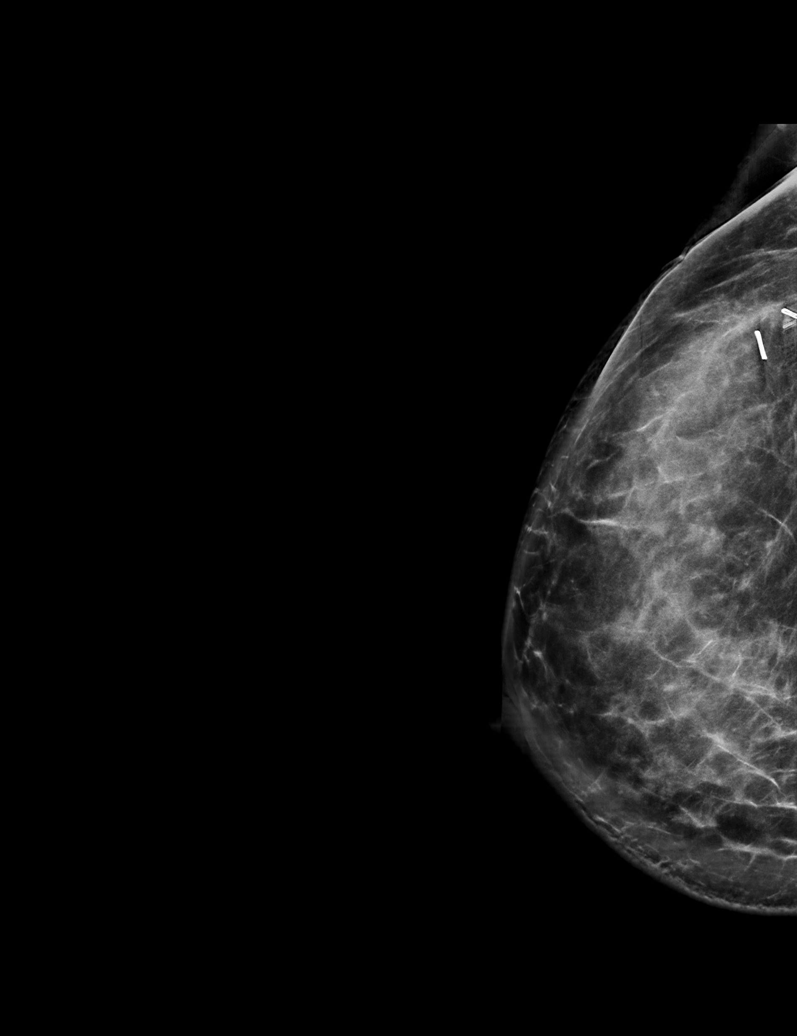

[R CC synth-2D]
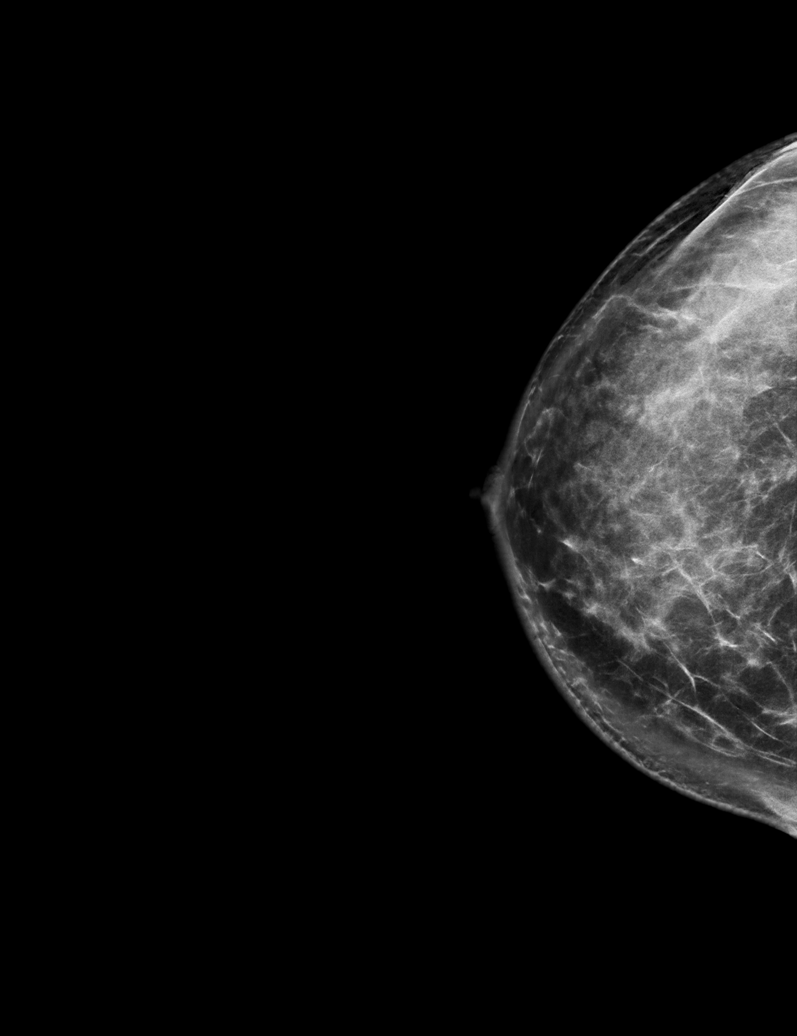

[L MLO synth-2D]
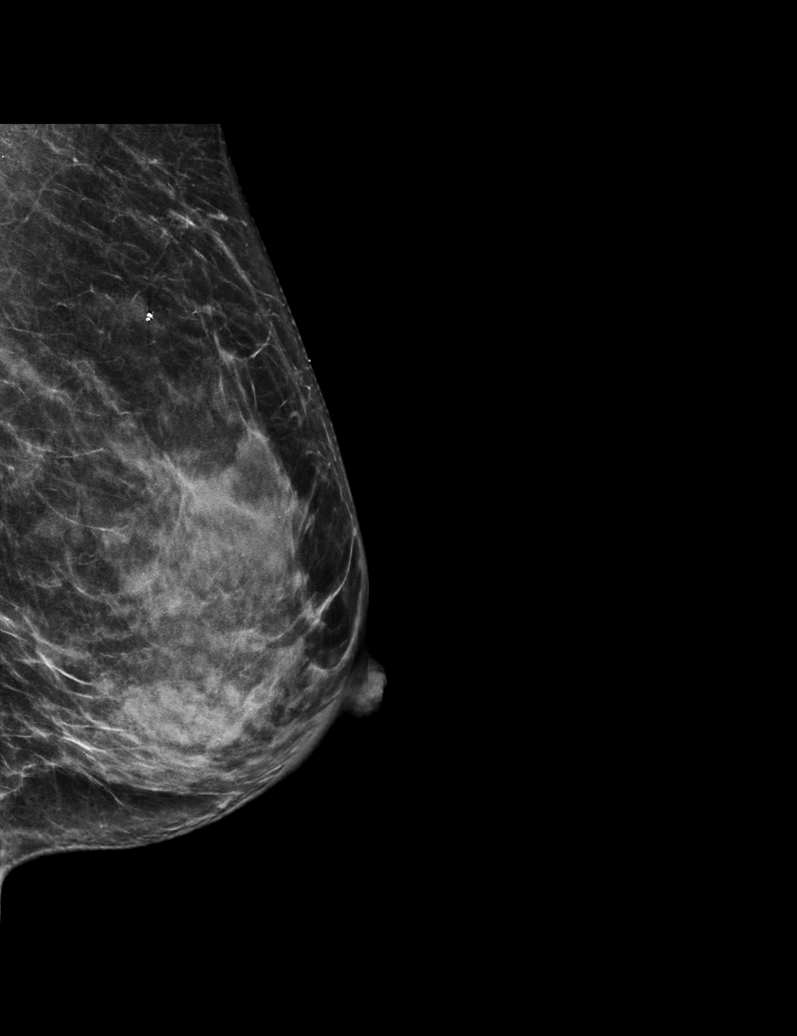

[6 of 31 positions shown; findings below may reference images not displayed]

ACR Breast Density Category d: The breast tissue is extremely dense,
which lowers the sensitivity of mammography.
FINDINGS: Interval post lumpectomy and postradiation changes on the right. No
findings suspicious for malignancy in either breast.
IMPRESSION: No evidence of malignancy.

RECOMMENDATION:
Bilateral diagnostic mammogram in 1 year.

I have discussed the findings and recommendations with the patient.
If applicable, a reminder letter will be sent to the patient
regarding the next appointment.

BI-RADS CATEGORY  2: Benign.

## 2022-01-04 DIAGNOSIS — H5213 Myopia, bilateral: Secondary | ICD-10-CM | POA: Diagnosis not present

## 2022-01-04 DIAGNOSIS — H524 Presbyopia: Secondary | ICD-10-CM | POA: Diagnosis not present

## 2022-01-25 ENCOUNTER — Other Ambulatory Visit (HOSPITAL_BASED_OUTPATIENT_CLINIC_OR_DEPARTMENT_OTHER): Payer: Self-pay

## 2022-01-25 DIAGNOSIS — N3090 Cystitis, unspecified without hematuria: Secondary | ICD-10-CM | POA: Diagnosis not present

## 2022-01-25 DIAGNOSIS — R310 Gross hematuria: Secondary | ICD-10-CM | POA: Diagnosis not present

## 2022-01-25 DIAGNOSIS — N390 Urinary tract infection, site not specified: Secondary | ICD-10-CM | POA: Diagnosis not present

## 2022-01-25 MED ORDER — NITROFURANTOIN MONOHYD MACRO 100 MG PO CAPS
ORAL_CAPSULE | ORAL | 0 refills | Status: DC
  Filled 2022-01-25: qty 10, 5d supply, fill #0

## 2022-02-06 ENCOUNTER — Other Ambulatory Visit (HOSPITAL_BASED_OUTPATIENT_CLINIC_OR_DEPARTMENT_OTHER): Payer: Self-pay

## 2022-02-14 DIAGNOSIS — D2262 Melanocytic nevi of left upper limb, including shoulder: Secondary | ICD-10-CM | POA: Diagnosis not present

## 2022-02-14 DIAGNOSIS — D2272 Melanocytic nevi of left lower limb, including hip: Secondary | ICD-10-CM | POA: Diagnosis not present

## 2022-02-14 DIAGNOSIS — L821 Other seborrheic keratosis: Secondary | ICD-10-CM | POA: Diagnosis not present

## 2022-02-14 DIAGNOSIS — D1801 Hemangioma of skin and subcutaneous tissue: Secondary | ICD-10-CM | POA: Diagnosis not present

## 2022-02-14 DIAGNOSIS — D485 Neoplasm of uncertain behavior of skin: Secondary | ICD-10-CM | POA: Diagnosis not present

## 2022-02-14 DIAGNOSIS — B078 Other viral warts: Secondary | ICD-10-CM | POA: Diagnosis not present

## 2022-02-14 DIAGNOSIS — D225 Melanocytic nevi of trunk: Secondary | ICD-10-CM | POA: Diagnosis not present

## 2022-02-14 DIAGNOSIS — C44612 Basal cell carcinoma of skin of right upper limb, including shoulder: Secondary | ICD-10-CM | POA: Diagnosis not present

## 2022-02-14 DIAGNOSIS — D2271 Melanocytic nevi of right lower limb, including hip: Secondary | ICD-10-CM | POA: Diagnosis not present

## 2022-02-14 DIAGNOSIS — Z85828 Personal history of other malignant neoplasm of skin: Secondary | ICD-10-CM | POA: Diagnosis not present

## 2022-02-14 DIAGNOSIS — D2261 Melanocytic nevi of right upper limb, including shoulder: Secondary | ICD-10-CM | POA: Diagnosis not present

## 2022-02-21 ENCOUNTER — Other Ambulatory Visit (HOSPITAL_BASED_OUTPATIENT_CLINIC_OR_DEPARTMENT_OTHER): Payer: Self-pay

## 2022-02-23 ENCOUNTER — Other Ambulatory Visit: Payer: Self-pay | Admitting: Hematology and Oncology

## 2022-02-23 DIAGNOSIS — Z170 Estrogen receptor positive status [ER+]: Secondary | ICD-10-CM

## 2022-03-15 ENCOUNTER — Encounter (HOSPITAL_COMMUNITY): Payer: Self-pay

## 2022-03-19 ENCOUNTER — Ambulatory Visit: Payer: 59 | Attending: General Surgery | Admitting: Physical Therapy

## 2022-03-19 ENCOUNTER — Encounter: Payer: Self-pay | Admitting: Physical Therapy

## 2022-03-19 DIAGNOSIS — Z483 Aftercare following surgery for neoplasm: Secondary | ICD-10-CM | POA: Insufficient documentation

## 2022-03-19 NOTE — Therapy (Signed)
?OUTPATIENT PHYSICAL THERAPY SOZO SCREENING NOTE ? ? ?Patient Name: Destiny Mitchell ?MRN: [id number] ?DOB:March 03, 1976, 46 y.o., female ?Today's Date: 03/19/2022 ? ?PCP: [name], MD ?REFERRING PROVIDER: Rolm Bookbinder, MD ? ? PT End of Session - 03/19/22 1224   ? ? Visit Number 6   ? Number of Visits 10   ? PT Start Time 1200   ? PT Stop Time 1210   ? PT Time Calculation (min) 10 min   ? Activity Tolerance Patient tolerated treatment well   ? Behavior During Therapy [facility] for tasks assessed/performed   ? ?  ?  ? ?  ? ? ?Past Medical History:  ?Diagnosis Date  ? Anxiety attack   ? Back pain   ? Chronic kidney disease   ? Family history of breast cancer   ? mom at age 79  ? Family history of colon cancer   ? Family history of kidney cancer   ? Family history of lung cancer   ? Family history of pancreatic cancer   ? Family history of prostate cancer   ? Family history of stomach cancer   ? History of radiation therapy 01/16/2021-03/03/2021  ? IMRT to right breast    Dr Gery Pray  ? Palpitations   ? Personal history of radiation therapy   ? Pre-eclampsia, postpartum   ? Sinus tachycardia   ? SVD (spontaneous vaginal delivery) 06/22/2014  ? Syncope and collapse   ? Ureter obstruction   ? s/p surgery in 2009  ? ?Past Surgical History:  ?Procedure Laterality Date  ? BREAST LUMPECTOMY Left 12/01/2020  ? w/ radiation. no chemo. started tamoxifen  ? BREAST LUMPECTOMY WITH RADIOACTIVE SEED AND SENTINEL LYMPH NODE BIOPSY Right 12/01/2020  ? Procedure: RIGHT BREAST LUMPECTOMY WITH RADIOACTIVE SEED AND RIGHT AXILLARY SENTINEL LYMPH NODE BIOPSY;  Surgeon: Rolm Bookbinder, MD;  Location: [facility];  Service: General;  Laterality: Right;  PEC BLOCK  ? KIDNEY SURGERY  05/26/2008  ? Blocked ureter after pregnancy  ? US ECHOCARDIOGRAPHY  01/01/2005  ? EF 55-60%. NORMAL  ? WISDOM TOOTH EXTRACTION    ? ?Patient Active Problem List  ? Diagnosis Date Noted  ? Genetic testing 11/14/2020  ? Malignant neoplasm of  upper-outer quadrant of right breast in female, estrogen receptor positive ([place]) 11/04/2020  ? Family history of prostate cancer   ? Family history of lung cancer   ? Family history of kidney cancer   ? Family history of stomach cancer   ? Family history of breast cancer   ? Family history of colon cancer   ? Family history of pancreatic cancer   ? Dysuria 06/09/2019  ? Vaginal discharge 06/09/2019  ? Urinary urgency 06/09/2019  ? Mallet finger of left hand 12/06/2017  ? Pain of left hand 12/06/2017  ? Normal labor 06/22/2014  ? SVD (spontaneous vaginal delivery) 06/22/2014  ? Heart palpitations 09/10/2011  ? ACUTE MAXILLARY SINUSITIS 08/27/2009  ? ? ?REFERRING DIAG: right breast cancer at risk for lymphedema ? ?THERAPY DIAG:  ?Aftercare following surgery for neoplasm ? ?PERTINENT HISTORY: Patient was diagnosed on 10/31/2020 with 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 Patient underwent a right lumpectomy and sentinel node biopsy (2 negative nodes) on 12/01/2020. It is ER/PR positive and HER2 negative with a Ki67 of 10%.  ? ?PRECAUTIONS: right UE Lymphedema risk ? ?SUBJECTIVE: Here for SOZO screen; doing fine ? ?PAIN:  ?Are you having pain? No ? ?SOZO SCREENING: ?Patient was assessed today using the SOZO  machine to determine the lymphedema index score. This was compared to her baseline score. It was determined that she is within the recommended range when compared to her baseline and no further action is needed at this time. She will continue SOZO screenings. These are done every 3 months for 2 years post operatively followed by every 6 months for 2 years, and then annually. ? ?L-DEX LYMPHEDEMA SCREENING ?Measurement Type: Unilateral ?L-DEX MEASUREMENT EXTREMITY: Upper Extremity ?POSITION : Standing ?DOMINANT SIDE: Right ?At Risk Side: Right ?BASELINE SCORE (UNILATERAL): -0.5 ?L-DEX SCORE (UNILATERAL): -2 ?VALUE CHANGE (UNILAT): -1.5 ? ?Annia Friendly, PT ?03/19/22 12:27 PM ? ? ?  ? ?

## 2022-04-12 ENCOUNTER — Telehealth: Payer: Self-pay | Admitting: Hematology and Oncology

## 2022-04-12 NOTE — Telephone Encounter (Signed)
Rescheduled appointment per provider PAL. Patient is aware of the changes made to her upcoming appointment. 

## 2022-04-20 ENCOUNTER — Other Ambulatory Visit (HOSPITAL_BASED_OUTPATIENT_CLINIC_OR_DEPARTMENT_OTHER): Payer: Self-pay

## 2022-04-20 MED ORDER — AZITHROMYCIN 250 MG PO TABS
ORAL_TABLET | ORAL | 0 refills | Status: DC
  Filled 2022-04-20: qty 6, 5d supply, fill #0

## 2022-04-30 ENCOUNTER — Other Ambulatory Visit (HOSPITAL_BASED_OUTPATIENT_CLINIC_OR_DEPARTMENT_OTHER): Payer: Self-pay

## 2022-05-08 ENCOUNTER — Ambulatory Visit: Payer: BLUE CROSS/BLUE SHIELD | Admitting: Hematology and Oncology

## 2022-05-10 DIAGNOSIS — C50411 Malignant neoplasm of upper-outer quadrant of right female breast: Secondary | ICD-10-CM | POA: Diagnosis not present

## 2022-05-10 DIAGNOSIS — Z170 Estrogen receptor positive status [ER+]: Secondary | ICD-10-CM | POA: Diagnosis not present

## 2022-06-04 ENCOUNTER — Other Ambulatory Visit: Payer: Self-pay | Admitting: Hematology and Oncology

## 2022-06-05 ENCOUNTER — Other Ambulatory Visit (HOSPITAL_BASED_OUTPATIENT_CLINIC_OR_DEPARTMENT_OTHER): Payer: Self-pay

## 2022-06-05 MED ORDER — TAMOXIFEN CITRATE 20 MG PO TABS
20.0000 mg | ORAL_TABLET | Freq: Every day | ORAL | 3 refills | Status: DC
  Filled 2022-06-05: qty 90, 90d supply, fill #0
  Filled 2022-09-03: qty 90, 90d supply, fill #1
  Filled 2022-12-06: qty 90, 90d supply, fill #2
  Filled 2023-04-23: qty 90, 90d supply, fill #3

## 2022-06-11 ENCOUNTER — Ambulatory Visit: Payer: 59 | Attending: General Surgery

## 2022-06-11 VITALS — Wt 154.5 lb

## 2022-06-11 DIAGNOSIS — Z483 Aftercare following surgery for neoplasm: Secondary | ICD-10-CM | POA: Insufficient documentation

## 2022-06-11 NOTE — Therapy (Signed)
OUTPATIENT PHYSICAL THERAPY SOZO SCREENING NOTE   Patient Name: Destiny Mitchell MRN: [id number] DOB:Feb 27, 1976, 46 y.o., female Today's Date: 06/11/2022  PCP: [name], MD REFERRING PROVIDER: Rolm Bookbinder, MD   PT End of Session - 06/11/22 1209     Visit Number 6   # unchanged due to screen only   PT Start Time 1206    PT Stop Time 1211    PT Time Calculation (min) 5 min    Activity Tolerance Patient tolerated treatment well    Behavior During Therapy WFL for tasks assessed/performed             Past Medical History:  Diagnosis Date   Anxiety attack    Back pain    Chronic kidney disease    Family history of breast cancer    mom at age 46   Family history of colon cancer    Family history of kidney cancer    Family history of lung cancer    Family history of pancreatic cancer    Family history of prostate cancer    Family history of stomach cancer    History of radiation therapy 01/16/2021-03/03/2021   IMRT to right breast    Dr Gery Pray   Palpitations    Personal history of radiation therapy    Pre-eclampsia, postpartum    Sinus tachycardia    SVD (spontaneous vaginal delivery) 06/22/2014   Syncope and collapse    Ureter obstruction    s/p surgery in 2009   Past Surgical History:  Procedure Laterality Date   BREAST LUMPECTOMY Left 12/01/2020   w/ radiation. no chemo. started tamoxifen   BREAST LUMPECTOMY WITH RADIOACTIVE SEED AND SENTINEL LYMPH NODE BIOPSY Right 12/01/2020   Procedure: RIGHT BREAST LUMPECTOMY WITH RADIOACTIVE SEED AND RIGHT AXILLARY SENTINEL LYMPH NODE BIOPSY;  Surgeon: Rolm Bookbinder, MD;  Location: [facility];  Service: General;  Laterality: Right;  PEC BLOCK   KIDNEY SURGERY  05/26/2008   Blocked ureter after pregnancy   US ECHOCARDIOGRAPHY  01/01/2005   EF 55-60%. NORMAL   WISDOM TOOTH EXTRACTION     Patient Active Problem List   Diagnosis Date Noted   Genetic testing 11/14/2020   Malignant  neoplasm of upper-outer quadrant of right breast in female, estrogen receptor positive ([place]) 11/04/2020   Family history of prostate cancer    Family history of lung cancer    Family history of kidney cancer    Family history of stomach cancer    Family history of breast cancer    Family history of colon cancer    Family history of pancreatic cancer    Dysuria 06/09/2019   Vaginal discharge 06/09/2019   Urinary urgency 06/09/2019   Mallet finger of left hand 12/06/2017   Pain of left hand 12/06/2017   Normal labor 06/22/2014   SVD (spontaneous vaginal delivery) 06/22/2014   Heart palpitations 09/10/2011   ACUTE MAXILLARY SINUSITIS 08/27/2009    REFERRING DIAG: right breast cancer at risk for lymphedema  THERAPY DIAG: Aftercare following surgery for neoplasm  PERTINENT HISTORY: Patient was diagnosed on 10/31/2020 with 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 Patient underwent a right lumpectomy and sentinel node biopsy (2 negative nodes) on 12/01/2020. It is ER/PR positive and HER2 negative with a Ki67 of 10%.   PRECAUTIONS: right UE Lymphedema risk  SUBJECTIVE: Pt returns for her 3 month L-Dex screen.   PAIN:  Are you having pain? No  SOZO SCREENING: Patient was assessed today using  the SOZO machine to determine the lymphedema index score. This was compared to her baseline score. It was determined that she is within the recommended range when compared to her baseline and no further action is needed at this time. She will continue SOZO screenings. These are done every 3 months for 2 years post operatively followed by every 6 months for 2 years, and then annually.  L-DEX LYMPHEDEMA SCREENING Measurement Type: Unilateral L-DEX MEASUREMENT EXTREMITY: Upper Extremity POSITION : Standing DOMINANT SIDE: Right At Risk Side: Right BASELINE SCORE (UNILATERAL): -0.5 L-DEX SCORE (UNILATERAL): -3.5 VALUE CHANGE (UNILAT): -3  Collie Siad, PTA 06/11/22 12:14 PM

## 2022-06-13 ENCOUNTER — Other Ambulatory Visit: Payer: 59

## 2022-06-18 ENCOUNTER — Ambulatory Visit: Payer: BLUE CROSS/BLUE SHIELD | Admitting: Hematology and Oncology

## 2022-06-22 ENCOUNTER — Ambulatory Visit
Admission: RE | Admit: 2022-06-22 | Discharge: 2022-06-22 | Disposition: A | Discharge status: Home / Self Care | Payer: 59 | Source: Ambulatory Visit | Attending: Hematology and Oncology | Admitting: Hematology and Oncology

## 2022-06-22 DIAGNOSIS — C50911 Malignant neoplasm of unspecified site of right female breast: Secondary | ICD-10-CM | POA: Diagnosis not present

## 2022-06-22 DIAGNOSIS — Z853 Personal history of malignant neoplasm of breast: Secondary | ICD-10-CM | POA: Diagnosis not present

## 2022-06-22 DIAGNOSIS — C50411 Malignant neoplasm of upper-outer quadrant of right female breast: Secondary | ICD-10-CM

## 2022-06-22 DIAGNOSIS — Z170 Estrogen receptor positive status [ER+]: Secondary | ICD-10-CM

## 2022-06-22 MED ORDER — GADOBUTROL 1 MMOL/ML IV SOLN
6.0000 mL | Freq: Once | INTRAVENOUS | Status: AC | PRN
  Administered 2022-06-22: 6 mL via INTRAVENOUS

## 2022-06-25 ENCOUNTER — Telehealth: Payer: Self-pay

## 2022-06-25 ENCOUNTER — Other Ambulatory Visit: Payer: Self-pay | Admitting: Hematology and Oncology

## 2022-06-25 DIAGNOSIS — R9389 Abnormal findings on diagnostic imaging of other specified body structures: Secondary | ICD-10-CM

## 2022-06-25 NOTE — Telephone Encounter (Signed)
Pt called voicing concerns regarding MRI she had 06/22/22. Pt states there were no results discussed with her per her MRI, then she received a call from the scheduler at [facility] letting her know she would need to come in for MR guided bx. Pt is highly upset that no one called her regarding the results before being called for a biopsy. She states she would prefer to hear from Dr Lindi Adie regarding this and she would like more information on plan accordingly.   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 Extended apology to patient and attempted to discuss results, but she states she would prefer for Dr Lindi Adie to call her tonight. Advised pt he is not on-call but that He would be made aware of this first thing in the morning. She verbalized thanks and understanding.

## 2022-07-02 ENCOUNTER — Inpatient Hospital Stay: Payer: 59 | Admitting: Hematology and Oncology

## 2022-07-04 ENCOUNTER — Ambulatory Visit
Admission: RE | Admit: 2022-07-04 | Discharge: 2022-07-04 | Disposition: A | Discharge status: Home / Self Care | Payer: 59 | Source: Ambulatory Visit | Attending: Hematology and Oncology | Admitting: Hematology and Oncology

## 2022-07-04 DIAGNOSIS — N6311 Unspecified lump in the right breast, upper outer quadrant: Secondary | ICD-10-CM | POA: Diagnosis not present

## 2022-07-04 DIAGNOSIS — Z853 Personal history of malignant neoplasm of breast: Secondary | ICD-10-CM | POA: Diagnosis not present

## 2022-07-04 DIAGNOSIS — R9389 Abnormal findings on diagnostic imaging of other specified body structures: Secondary | ICD-10-CM

## 2022-07-04 DIAGNOSIS — N6011 Diffuse cystic mastopathy of right breast: Secondary | ICD-10-CM | POA: Diagnosis not present

## 2022-07-04 MED ORDER — GADOBUTROL 1 MMOL/ML IV SOLN
8.0000 mL | Freq: Once | INTRAVENOUS | Status: AC | PRN
  Administered 2022-07-04: 8 mL via INTRAVENOUS

## 2022-07-06 ENCOUNTER — Other Ambulatory Visit: Payer: Self-pay | Admitting: *Deleted

## 2022-07-06 DIAGNOSIS — C50411 Malignant neoplasm of upper-outer quadrant of right female breast: Secondary | ICD-10-CM

## 2022-07-06 NOTE — Progress Notes (Signed)
MD request for repeat breat MRI in 6 months for close follow up of recent abnormal MRI needing an MRI guided biopsy.  Orders placed.  RN attempt x1 to contact pt.  No answer, LVM for pt to return call to the office.

## 2022-07-09 ENCOUNTER — Telehealth: Payer: Self-pay | Admitting: Hematology and Oncology

## 2022-07-09 NOTE — Telephone Encounter (Signed)
Scheduled appointment per 8/11 staff message. Patient is aware.

## 2022-07-11 ENCOUNTER — Ambulatory Visit: Payer: 59 | Admitting: Hematology and Oncology

## 2022-07-23 ENCOUNTER — Other Ambulatory Visit (HOSPITAL_BASED_OUTPATIENT_CLINIC_OR_DEPARTMENT_OTHER): Payer: Self-pay

## 2022-08-15 DIAGNOSIS — Z1212 Encounter for screening for malignant neoplasm of rectum: Secondary | ICD-10-CM | POA: Diagnosis not present

## 2022-08-15 DIAGNOSIS — R7989 Other specified abnormal findings of blood chemistry: Secondary | ICD-10-CM | POA: Diagnosis not present

## 2022-08-15 DIAGNOSIS — F419 Anxiety disorder, unspecified: Secondary | ICD-10-CM | POA: Diagnosis not present

## 2022-08-15 DIAGNOSIS — I10 Essential (primary) hypertension: Secondary | ICD-10-CM | POA: Diagnosis not present

## 2022-08-15 DIAGNOSIS — E785 Hyperlipidemia, unspecified: Secondary | ICD-10-CM | POA: Diagnosis not present

## 2022-08-22 ENCOUNTER — Other Ambulatory Visit (HOSPITAL_BASED_OUTPATIENT_CLINIC_OR_DEPARTMENT_OTHER): Payer: Self-pay

## 2022-08-22 ENCOUNTER — Other Ambulatory Visit (HOSPITAL_COMMUNITY): Payer: Self-pay

## 2022-08-22 DIAGNOSIS — Z0000 Encounter for general adult medical examination without abnormal findings: Secondary | ICD-10-CM | POA: Diagnosis not present

## 2022-08-22 DIAGNOSIS — D1801 Hemangioma of skin and subcutaneous tissue: Secondary | ICD-10-CM | POA: Diagnosis not present

## 2022-08-22 DIAGNOSIS — C50919 Malignant neoplasm of unspecified site of unspecified female breast: Secondary | ICD-10-CM | POA: Diagnosis not present

## 2022-08-22 DIAGNOSIS — E785 Hyperlipidemia, unspecified: Secondary | ICD-10-CM | POA: Diagnosis not present

## 2022-08-22 DIAGNOSIS — L821 Other seborrheic keratosis: Secondary | ICD-10-CM | POA: Diagnosis not present

## 2022-08-22 DIAGNOSIS — D2262 Melanocytic nevi of left upper limb, including shoulder: Secondary | ICD-10-CM | POA: Diagnosis not present

## 2022-08-22 DIAGNOSIS — F419 Anxiety disorder, unspecified: Secondary | ICD-10-CM | POA: Diagnosis not present

## 2022-08-22 DIAGNOSIS — R002 Palpitations: Secondary | ICD-10-CM | POA: Diagnosis not present

## 2022-08-22 DIAGNOSIS — L812 Freckles: Secondary | ICD-10-CM | POA: Diagnosis not present

## 2022-08-22 DIAGNOSIS — D2271 Melanocytic nevi of right lower limb, including hip: Secondary | ICD-10-CM | POA: Diagnosis not present

## 2022-08-22 DIAGNOSIS — D2261 Melanocytic nevi of right upper limb, including shoulder: Secondary | ICD-10-CM | POA: Diagnosis not present

## 2022-08-22 DIAGNOSIS — D225 Melanocytic nevi of trunk: Secondary | ICD-10-CM | POA: Diagnosis not present

## 2022-08-22 DIAGNOSIS — R82998 Other abnormal findings in urine: Secondary | ICD-10-CM | POA: Diagnosis not present

## 2022-08-22 DIAGNOSIS — I10 Essential (primary) hypertension: Secondary | ICD-10-CM | POA: Diagnosis not present

## 2022-08-22 DIAGNOSIS — Z85828 Personal history of other malignant neoplasm of skin: Secondary | ICD-10-CM | POA: Diagnosis not present

## 2022-08-22 DIAGNOSIS — K5900 Constipation, unspecified: Secondary | ICD-10-CM | POA: Diagnosis not present

## 2022-08-22 DIAGNOSIS — Z1339 Encounter for screening examination for other mental health and behavioral disorders: Secondary | ICD-10-CM | POA: Diagnosis not present

## 2022-08-22 MED ORDER — HYDROCHLOROTHIAZIDE 25 MG PO TABS
25.0000 mg | ORAL_TABLET | Freq: Every morning | ORAL | 3 refills | Status: DC
  Filled 2022-08-22: qty 90, 90d supply, fill #0

## 2022-08-22 MED ORDER — PROPRANOLOL HCL 10 MG PO TABS
10.0000 mg | ORAL_TABLET | Freq: Three times a day (TID) | ORAL | 3 refills | Status: DC | PRN
  Filled 2022-08-22: qty 30, 5d supply, fill #0

## 2022-08-22 MED ORDER — MOMETASONE FUROATE 0.1 % EX CREA
TOPICAL_CREAM | CUTANEOUS | 1 refills | Status: DC
  Filled 2022-08-22: qty 45, 30d supply, fill #0
  Filled 2022-12-06: qty 45, 30d supply, fill #1

## 2022-08-22 MED ORDER — HYDROCHLOROTHIAZIDE 25 MG PO TABS
25.0000 mg | ORAL_TABLET | ORAL | 3 refills | Status: DC
  Filled 2022-08-22 – 2022-09-03 (×2): qty 90, 90d supply, fill #0

## 2022-08-23 ENCOUNTER — Other Ambulatory Visit (HOSPITAL_COMMUNITY): Payer: Self-pay

## 2022-08-31 ENCOUNTER — Other Ambulatory Visit (HOSPITAL_COMMUNITY): Payer: Self-pay

## 2022-09-03 ENCOUNTER — Other Ambulatory Visit (HOSPITAL_BASED_OUTPATIENT_CLINIC_OR_DEPARTMENT_OTHER): Payer: Self-pay

## 2022-09-03 MED ORDER — HYDROCHLOROTHIAZIDE 25 MG PO TABS
25.0000 mg | ORAL_TABLET | Freq: Every morning | ORAL | 3 refills | Status: DC
  Filled 2022-12-06: qty 90, 90d supply, fill #0
  Filled 2023-04-23: qty 90, 90d supply, fill #1
  Filled 2023-07-20: qty 30, 30d supply, fill #2

## 2022-09-12 ENCOUNTER — Ambulatory Visit: Payer: 59 | Attending: General Surgery | Admitting: Rehabilitation

## 2022-09-12 ENCOUNTER — Encounter: Payer: Self-pay | Admitting: Rehabilitation

## 2022-09-12 DIAGNOSIS — Z483 Aftercare following surgery for neoplasm: Secondary | ICD-10-CM

## 2022-09-12 DIAGNOSIS — C50411 Malignant neoplasm of upper-outer quadrant of right female breast: Secondary | ICD-10-CM

## 2022-09-12 DIAGNOSIS — Z170 Estrogen receptor positive status [ER+]: Secondary | ICD-10-CM | POA: Insufficient documentation

## 2022-09-12 NOTE — Therapy (Signed)
OUTPATIENT PHYSICAL THERAPY SOZO SCREENING NOTE   Patient Name: Destiny Mitchell MRN: [id number] DOB:02-17-1976, 46 y.o., female Today's Date: 09/12/2022  PCP: [name], MD REFERRING PROVIDER: Rolm Bookbinder, MD   PT End of Session - 09/12/22 1205     Visit Number 6    Number of Visits --    Date for PT Re-Evaluation --    PT Start Time 1200    PT Stop Time 1206    PT Time Calculation (min) 6 min    Activity Tolerance Patient tolerated treatment well    Behavior During Therapy WFL for tasks assessed/performed             Past Medical History:  Diagnosis Date   Anxiety attack    Back pain    Chronic kidney disease    Family history of breast cancer    mom at age 83   Family history of colon cancer    Family history of kidney cancer    Family history of lung cancer    Family history of pancreatic cancer    Family history of prostate cancer    Family history of stomach cancer    History of radiation therapy 01/16/2021-03/03/2021   IMRT to right breast    Dr Gery Pray   Palpitations    Personal history of radiation therapy    Pre-eclampsia, postpartum    Sinus tachycardia    SVD (spontaneous vaginal delivery) 06/22/2014   Syncope and collapse    Ureter obstruction    s/p surgery in 2009   Past Surgical History:  Procedure Laterality Date   BREAST LUMPECTOMY Left 12/01/2020   w/ radiation. no chemo. started tamoxifen   BREAST LUMPECTOMY WITH RADIOACTIVE SEED AND SENTINEL LYMPH NODE BIOPSY Right 12/01/2020   Procedure: RIGHT BREAST LUMPECTOMY WITH RADIOACTIVE SEED AND RIGHT AXILLARY SENTINEL LYMPH NODE BIOPSY;  Surgeon: Rolm Bookbinder, MD;  Location: [facility];  Service: General;  Laterality: Right;  PEC BLOCK   KIDNEY SURGERY  05/26/2008   Blocked ureter after pregnancy   US ECHOCARDIOGRAPHY  01/01/2005   EF 55-60%. NORMAL   WISDOM TOOTH EXTRACTION     Patient Active Problem List   Diagnosis Date Noted   Genetic testing  11/14/2020   Malignant neoplasm of upper-outer quadrant of right breast in female, estrogen receptor positive ([place]) 11/04/2020   Family history of prostate cancer    Family history of lung cancer    Family history of kidney cancer    Family history of stomach cancer    Family history of breast cancer    Family history of colon cancer    Family history of pancreatic cancer    Dysuria 06/09/2019   Vaginal discharge 06/09/2019   Urinary urgency 06/09/2019   Mallet finger of left hand 12/06/2017   Pain of left hand 12/06/2017   Normal labor 06/22/2014   SVD (spontaneous vaginal delivery) 06/22/2014   Heart palpitations 09/10/2011   ACUTE MAXILLARY SINUSITIS 08/27/2009    REFERRING DIAG: right breast cancer at risk for lymphedema  THERAPY DIAG:  Aftercare following surgery for neoplasm  Malignant neoplasm of upper-outer quadrant of right breast in female, estrogen receptor positive ([place])  PERTINENT HISTORY: Patient was diagnosed on 10/31/2020 with 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 Patient underwent a right lumpectomy and sentinel node biopsy (2 negative nodes) on 12/01/2020. It is ER/PR positive and HER2 negative with a Ki67 of 10%.     PRECAUTIONS: right UE Lymphedema risk, None  SUBJECTIVE: No changes since last time   PAIN:  Are you having pain? No  SOZO SCREENING: Patient was assessed today using the SOZO machine to determine the lymphedema index score. This was compared to her baseline score. It was determined that she is within the recommended range when compared to her baseline and no further action is needed at this time. She will continue SOZO screenings. These are done every 3 months for 2 years post operatively followed by every 6 months for 2 years, and then annually.       Aneesah Hernan, Student-PT 09/12/2022, 12:07 PM

## 2022-09-16 ENCOUNTER — Telehealth: Payer: 59 | Admitting: Nurse Practitioner

## 2022-09-16 DIAGNOSIS — J020 Streptococcal pharyngitis: Secondary | ICD-10-CM | POA: Diagnosis not present

## 2022-09-16 MED ORDER — AMOXICILLIN 500 MG PO CAPS: 500.0000 mg | ORAL_CAPSULE | Freq: Two times a day (BID) | ORAL | 0 refills | Status: AC

## 2022-09-16 NOTE — Progress Notes (Signed)

## 2022-09-16 NOTE — Progress Notes (Signed)
I have spent 5 minutes in review of e-visit questionnaire, review and updating patient chart, medical decision making and response to patient.  ° °Trenyce Loera W Caasi Giglia, NP ° °  °

## 2022-09-18 ENCOUNTER — Other Ambulatory Visit (HOSPITAL_BASED_OUTPATIENT_CLINIC_OR_DEPARTMENT_OTHER): Payer: Self-pay

## 2022-09-18 MED ORDER — AZITHROMYCIN 500 MG PO TABS
500.0000 mg | ORAL_TABLET | Freq: Every day | ORAL | 0 refills | Status: AC
  Filled 2022-09-18: qty 3, 3d supply, fill #0

## 2022-10-29 ENCOUNTER — Ambulatory Visit
Admission: RE | Admit: 2022-10-29 | Discharge: 2022-10-29 | Disposition: A | Discharge status: Home / Self Care | Payer: 59 | Source: Ambulatory Visit | Attending: Hematology and Oncology | Admitting: Hematology and Oncology

## 2022-10-29 DIAGNOSIS — Z853 Personal history of malignant neoplasm of breast: Secondary | ICD-10-CM | POA: Diagnosis not present

## 2022-10-29 DIAGNOSIS — R92333 Mammographic heterogeneous density, bilateral breasts: Secondary | ICD-10-CM | POA: Diagnosis not present

## 2022-10-29 DIAGNOSIS — C50411 Malignant neoplasm of upper-outer quadrant of right female breast: Secondary | ICD-10-CM

## 2022-11-03 ENCOUNTER — Other Ambulatory Visit (HOSPITAL_BASED_OUTPATIENT_CLINIC_OR_DEPARTMENT_OTHER): Payer: Self-pay

## 2022-11-03 DIAGNOSIS — U071 COVID-19: Secondary | ICD-10-CM | POA: Diagnosis not present

## 2022-11-03 MED ORDER — PAXLOVID (300/100) 20 X 150 MG & 10 X 100MG PO TBPK
ORAL_TABLET | ORAL | 0 refills | Status: DC
  Filled 2022-11-03: qty 30, 5d supply, fill #0

## 2022-11-13 ENCOUNTER — Other Ambulatory Visit (HOSPITAL_BASED_OUTPATIENT_CLINIC_OR_DEPARTMENT_OTHER): Payer: Self-pay

## 2022-11-13 MED ORDER — AZITHROMYCIN 250 MG PO TABS
ORAL_TABLET | ORAL | 0 refills | Status: DC
  Filled 2022-11-13: qty 6, 5d supply, fill #0

## 2022-11-30 ENCOUNTER — Other Ambulatory Visit (HOSPITAL_BASED_OUTPATIENT_CLINIC_OR_DEPARTMENT_OTHER): Payer: Self-pay

## 2022-11-30 DIAGNOSIS — I10 Essential (primary) hypertension: Secondary | ICD-10-CM | POA: Diagnosis not present

## 2022-11-30 DIAGNOSIS — E785 Hyperlipidemia, unspecified: Secondary | ICD-10-CM | POA: Diagnosis not present

## 2022-11-30 DIAGNOSIS — F419 Anxiety disorder, unspecified: Secondary | ICD-10-CM | POA: Diagnosis not present

## 2022-11-30 DIAGNOSIS — R053 Chronic cough: Secondary | ICD-10-CM | POA: Diagnosis not present

## 2022-11-30 DIAGNOSIS — C50919 Malignant neoplasm of unspecified site of unspecified female breast: Secondary | ICD-10-CM | POA: Diagnosis not present

## 2022-11-30 DIAGNOSIS — R002 Palpitations: Secondary | ICD-10-CM | POA: Diagnosis not present

## 2022-11-30 MED ORDER — PREDNISONE 20 MG PO TABS
20.0000 mg | ORAL_TABLET | Freq: Every day | ORAL | 0 refills | Status: DC
  Filled 2022-11-30: qty 7, 7d supply, fill #0

## 2022-12-06 ENCOUNTER — Other Ambulatory Visit (HOSPITAL_BASED_OUTPATIENT_CLINIC_OR_DEPARTMENT_OTHER): Payer: Self-pay

## 2022-12-07 ENCOUNTER — Other Ambulatory Visit (HOSPITAL_BASED_OUTPATIENT_CLINIC_OR_DEPARTMENT_OTHER): Payer: Self-pay

## 2022-12-11 ENCOUNTER — Ambulatory Visit: Payer: Commercial Managed Care - PPO | Admitting: Rehabilitation

## 2023-01-10 DIAGNOSIS — H5213 Myopia, bilateral: Secondary | ICD-10-CM | POA: Diagnosis not present

## 2023-01-11 ENCOUNTER — Ambulatory Visit
Admission: RE | Admit: 2023-01-11 | Discharge: 2023-01-11 | Disposition: A | Discharge status: Home / Self Care | Payer: Commercial Managed Care - PPO | Source: Ambulatory Visit | Attending: Hematology and Oncology | Admitting: Hematology and Oncology

## 2023-01-11 DIAGNOSIS — N6489 Other specified disorders of breast: Secondary | ICD-10-CM | POA: Diagnosis not present

## 2023-01-11 DIAGNOSIS — Z170 Estrogen receptor positive status [ER+]: Secondary | ICD-10-CM

## 2023-01-11 MED ORDER — GADOPICLENOL 0.5 MMOL/ML IV SOLN
7.0000 mL | Freq: Once | INTRAVENOUS | Status: AC | PRN
  Administered 2023-01-11: 7 mL via INTRAVENOUS

## 2023-01-11 NOTE — Progress Notes (Signed)
Patient Care Team: [name], MD as PCP - General (Internal Medicine) Nicholas Lose, MD as Consulting Physician (Hematology and Oncology) Rolm Bookbinder, MD as Consulting Physician (General Surgery) Gery Pray, MD as Consulting Physician (Radiation Oncology) Delice Bison Charlestine Massed, NP as Nurse Practitioner (Hematology and Oncology) Harmon Pier, RN as Registered Nurse  DIAGNOSIS: No diagnosis found.  SUMMARY OF ONCOLOGIC HISTORY: Oncology History  Malignant neoplasm of upper-outer quadrant of right breast in female, estrogen receptor positive ([place])  10/31/2020 Initial Diagnosis    Screening detected right breast mass 10:30 position 1.7 cm by ultrasound, numerous benign-appearing cysts largest measuring 1.3 cm.  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   11/04/2020 Cancer Staging   Staging form: Breast, AJCC 8th Edition - Clinical: Stage IA (cT1c, cN0, cM0, G2, ER+, PR+, HER2-) - Signed by Nicholas Lose, MD on 11/04/2020   11/11/2020 Genetic Testing   Negative genetic testing on the STAT and Multicancer panel.  The Multi-Gene Panel offered by [facility] includes sequencing and/or deletion duplication testing of the following 85 genes: AIP, ALK, APC, ATM, AXIN2,BAP1,  BARD1, BLM, BMPR1A, BRCA1, BRCA2, BRIP1, CASR, CDC73, CDH1, CDK4, CDKN1B, CDKN1C, CDKN2A (p14ARF), CDKN2A (p16INK4a), CEBPA, CHEK2, CTNNA1, DICER1, DIS3L2, EGFR (c.2369C>T, p.Thr790Met variant only), EPCAM (Deletion/duplication testing only), FH, FLCN, GATA2, GPC3, GREM1 (Promoter region deletion/duplication testing only), HOXB13 (c.251G>A, p.Gly84Glu), HRAS, KIT, MAX, MEN1, MET, MITF (c.952G>A, p.Glu318Lys variant only), MLH1, MSH2, MSH3, MSH6, MUTYH, NBN, NF1, NF2, NTHL1, PALB2, PDGFRA, PHOX2B, PMS2, POLD1, POLE, POT1, PRKAR1A, PTCH1, PTEN, RAD50, RAD51C, RAD51D, RB1, RECQL4, RET, RNF43, RUNX1, SDHAF2, SDHA (sequence  changes only), SDHB, SDHC, SDHD, SMAD4, SMARCA4, SMARCB1, SMARCE1, STK11, SUFU, TERC, TERT, TMEM127, TP53, TSC1, TSC2, VHL, WRN and WT1.  The report date is 11/13/2020.   12/01/2020 Surgery   Right lumpectomy Donne Hazel): invasive and in situ ductal carcinoma, 1.6cm, grade 2, involved posterior margin, 2 right axillary lymph nodes negative for carcinoma.   12/16/2020 Oncotype testing   Oncotype DX score: 5; risk of distant recurrence at 9 years: 3%   01/17/2021 - 03/03/2021 Radiation Therapy   Adjuvant radiation    03/15/2021 -  Anti-estrogen oral therapy   Tamoxifen 20 mg daily     CHIEF COMPLIANT:   INTERVAL HISTORY: Destiny Mitchell is a   ALLERGIES:  has No Known Allergies.  MEDICATIONS:  Current Outpatient Medications  Medication Sig Dispense Refill   azithromycin (ZITHROMAX Z-PAK) 250 MG tablet Take 2 tablets by mouth on day 1, then take 1 tablet daily on days 2-5. 6 tablet 0   hydrochlorothiazide (HYDRODIURIL) 25 MG tablet TAKE 1 TABLET BY MOUTH DAILY 90 tablet 3   hydrochlorothiazide (HYDRODIURIL) 25 MG tablet Take 1 tablet (25 mg total) by mouth every morning. 90 tablet 3   hydrochlorothiazide (HYDRODIURIL) 25 MG tablet Take 1 tablet (25 mg total) by mouth in the morning. 90 tablet 3   hydrochlorothiazide (HYDRODIURIL) 25 MG tablet Take 1 tablet (25 mg total) by mouth every morning. 90 tablet 3   levonorgestrel (LILETTA) 20.1 MCG/DAY IUD Liletta 20.1 mcg/24 hrs (6 yrs) 52 mg intrauterine device  Take 1 insert by intrauterine route.     mometasone (ELOCON) 0.1 % cream Apply a small amount to skin twice a day 45 g 1   nirmatrelvir & ritonavir (PAXLOVID, 300/100,) 20 x 150 MG & 10 x 100MG TBPK Take as directed on dose pack. 30 tablet 0   predniSONE (DELTASONE) 20  MG tablet Take 1 tablet (20 mg total) by mouth daily for 7 days. 7 tablet 0   propranolol (INDERAL) 10 MG tablet Take 1-2 tablets (10-20 mg total) by mouth every 8 (eight) hours as needed for palpitations. 30 tablet 3    tamoxifen (NOLVADEX) 20 MG tablet Take 1 tablet (20 mg total) by mouth daily. 90 tablet 3   No current facility-administered medications for this visit.    PHYSICAL EXAMINATION: ECOG PERFORMANCE STATUS: {CHL ONC ECOG PS:[id number]}  There were no vitals filed for this visit. There were no vitals filed for this visit.  BREAST:*** No palpable masses or nodules in either right or left breasts. No palpable axillary supraclavicular or infraclavicular adenopathy no breast tenderness or nipple discharge. (exam performed in the presence of a chaperone)  LABORATORY DATA:  I have reviewed the data as listed    Latest Ref Rng & Units 11/24/2020    3:32 PM 04/07/2018    8:17 AM 04/04/2017    8:56 AM  CMP  Glucose 70 - 99 mg/dL 110  86    BUN 6 - 20 mg/dL 23  17    Creatinine 0.44 - 1.00 mg/dL 0.81  0.92    Sodium 135 - 145 mmol/L 137  139    Potassium 3.5 - 5.1 mmol/L 3.2  4.0    Chloride 98 - 111 mmol/L 101  100    CO2 22 - 32 mmol/L 26  25    Calcium 8.9 - 10.3 mg/dL 9.8  9.2    Total Protein 6.0 - 8.5 g/dL  7.1  7.1   Total Bilirubin 0.0 - 1.2 mg/dL  0.6  0.5   Alkaline Phos 39 - 117 IU/L  64  72   AST 0 - 40 IU/L  17  17   ALT 0 - 32 IU/L  13  9     Lab Results  Component Value Date   WBC 4.3 04/07/2018   HGB 13.1 04/07/2018   HCT 39.6 04/07/2018   MCV 81 04/07/2018   PLT 219 04/07/2018   NEUTROABS 2.7 04/07/2018    ASSESSMENT & PLAN:  No problem-specific Assessment & Plan notes found for this encounter.    No orders of the defined types were placed in this encounter.  The patient has a good understanding of the overall plan. she agrees with it. she will call with any problems that may develop before the next visit here. Total time spent: 30 mins including face to face time and time spent for planning, charting and co-ordination of care   Suzzette Righter, [place] 01/11/23    I Gardiner Coins am acting as a Education administrator for [employer]  ***

## 2023-01-14 ENCOUNTER — Inpatient Hospital Stay: Payer: Commercial Managed Care - PPO | Attending: Hematology and Oncology | Admitting: Hematology and Oncology

## 2023-01-14 VITALS — BP 162/68 | HR 71 | Temp 97.7°F | Resp 14 | Ht 66.0 in | Wt 159.0 lb

## 2023-01-14 DIAGNOSIS — Z79810 Long term (current) use of selective estrogen receptor modulators (SERMs): Secondary | ICD-10-CM | POA: Diagnosis not present

## 2023-01-14 DIAGNOSIS — Z170 Estrogen receptor positive status [ER+]: Secondary | ICD-10-CM | POA: Insufficient documentation

## 2023-01-14 DIAGNOSIS — C50411 Malignant neoplasm of upper-outer quadrant of right female breast: Secondary | ICD-10-CM | POA: Diagnosis not present

## 2023-01-14 DIAGNOSIS — Z923 Personal history of irradiation: Secondary | ICD-10-CM | POA: Diagnosis not present

## 2023-01-14 DIAGNOSIS — Z79899 Other long term (current) drug therapy: Secondary | ICD-10-CM | POA: Diagnosis not present

## 2023-01-14 NOTE — Assessment & Plan Note (Signed)
12/01/2020:Right lumpectomy Destiny Mitchell): invasive and in situ ductal carcinoma, 1.6cm, grade 2, involved posterior margin, 2 right axillary lymph nodes negative for carcinoma.  ER greater than 95%, PR greater than 95%, Ki-67 10%, HER-2 negative Oncotype DX score: 5; risk of distant recurrence at 9 years: 3% Adjuvant radiation 01/17/2021-03/03/2021   Treatment plan: Tamoxifen 20 mg daily started 03/17/2021 Tamoxifen Toxicities: Denies any major adverse effects with tamoxifen.  She is tolerating it fairly well.   Breast Cancer Surveillance: Mammograms 10/29/2022: Benign breast density category D 2.  Breast MRI 01/11/2023: Small area UOQ right breast non-mass enhancement at the site of biopsy.  102-monthfollow-up recommended 3.  Breast exam 01/14/2023: Benign  Based on the above recommendations we will obtain a breast MRI in August 2024. Follow-up with me in 1 year

## 2023-01-28 ENCOUNTER — Other Ambulatory Visit (HOSPITAL_BASED_OUTPATIENT_CLINIC_OR_DEPARTMENT_OTHER): Payer: Self-pay

## 2023-02-20 DIAGNOSIS — D1801 Hemangioma of skin and subcutaneous tissue: Secondary | ICD-10-CM | POA: Diagnosis not present

## 2023-02-20 DIAGNOSIS — D485 Neoplasm of uncertain behavior of skin: Secondary | ICD-10-CM | POA: Diagnosis not present

## 2023-02-20 DIAGNOSIS — D2262 Melanocytic nevi of left upper limb, including shoulder: Secondary | ICD-10-CM | POA: Diagnosis not present

## 2023-02-20 DIAGNOSIS — D2272 Melanocytic nevi of left lower limb, including hip: Secondary | ICD-10-CM | POA: Diagnosis not present

## 2023-02-20 DIAGNOSIS — D2261 Melanocytic nevi of right upper limb, including shoulder: Secondary | ICD-10-CM | POA: Diagnosis not present

## 2023-02-20 DIAGNOSIS — L812 Freckles: Secondary | ICD-10-CM | POA: Diagnosis not present

## 2023-02-20 DIAGNOSIS — Z85828 Personal history of other malignant neoplasm of skin: Secondary | ICD-10-CM | POA: Diagnosis not present

## 2023-02-20 DIAGNOSIS — D2339 Other benign neoplasm of skin of other parts of face: Secondary | ICD-10-CM | POA: Diagnosis not present

## 2023-02-20 DIAGNOSIS — D2271 Melanocytic nevi of right lower limb, including hip: Secondary | ICD-10-CM | POA: Diagnosis not present

## 2023-02-20 DIAGNOSIS — D225 Melanocytic nevi of trunk: Secondary | ICD-10-CM | POA: Diagnosis not present

## 2023-04-23 ENCOUNTER — Other Ambulatory Visit (HOSPITAL_BASED_OUTPATIENT_CLINIC_OR_DEPARTMENT_OTHER): Payer: Self-pay

## 2023-07-20 ENCOUNTER — Other Ambulatory Visit (HOSPITAL_BASED_OUTPATIENT_CLINIC_OR_DEPARTMENT_OTHER): Payer: Self-pay

## 2023-07-20 ENCOUNTER — Other Ambulatory Visit: Payer: Self-pay | Admitting: Hematology and Oncology

## 2023-07-21 ENCOUNTER — Other Ambulatory Visit: Payer: Self-pay

## 2023-07-22 ENCOUNTER — Other Ambulatory Visit (HOSPITAL_BASED_OUTPATIENT_CLINIC_OR_DEPARTMENT_OTHER): Payer: Self-pay

## 2023-07-22 MED ORDER — TAMOXIFEN CITRATE 20 MG PO TABS
20.0000 mg | ORAL_TABLET | Freq: Every day | ORAL | 3 refills | Status: DC
  Filled 2023-07-22: qty 30, 30d supply, fill #0
  Filled 2023-08-20: qty 30, 30d supply, fill #1
  Filled 2023-11-03: qty 30, 30d supply, fill #2
  Filled 2023-12-02: qty 30, 30d supply, fill #3
  Filled 2023-12-30: qty 30, 30d supply, fill #4
  Filled 2024-01-29 (×3): qty 30, 30d supply, fill #5
  Filled 2024-03-04: qty 30, 30d supply, fill #6
  Filled 2024-04-08: qty 30, 30d supply, fill #7
  Filled 2024-05-05: qty 30, 30d supply, fill #8
  Filled 2024-06-03: qty 30, 30d supply, fill #9
  Filled 2024-07-07: qty 30, 30d supply, fill #10

## 2023-07-23 ENCOUNTER — Other Ambulatory Visit (HOSPITAL_BASED_OUTPATIENT_CLINIC_OR_DEPARTMENT_OTHER): Payer: Self-pay

## 2023-08-20 ENCOUNTER — Other Ambulatory Visit (HOSPITAL_BASED_OUTPATIENT_CLINIC_OR_DEPARTMENT_OTHER): Payer: Self-pay

## 2023-08-26 DIAGNOSIS — Z85828 Personal history of other malignant neoplasm of skin: Secondary | ICD-10-CM | POA: Diagnosis not present

## 2023-08-26 DIAGNOSIS — D485 Neoplasm of uncertain behavior of skin: Secondary | ICD-10-CM | POA: Diagnosis not present

## 2023-08-26 DIAGNOSIS — C44311 Basal cell carcinoma of skin of nose: Secondary | ICD-10-CM | POA: Diagnosis not present

## 2023-08-26 DIAGNOSIS — D225 Melanocytic nevi of trunk: Secondary | ICD-10-CM | POA: Diagnosis not present

## 2023-08-26 DIAGNOSIS — D2271 Melanocytic nevi of right lower limb, including hip: Secondary | ICD-10-CM | POA: Diagnosis not present

## 2023-09-25 DIAGNOSIS — E785 Hyperlipidemia, unspecified: Secondary | ICD-10-CM | POA: Diagnosis not present

## 2023-09-25 DIAGNOSIS — Z1212 Encounter for screening for malignant neoplasm of rectum: Secondary | ICD-10-CM | POA: Diagnosis not present

## 2023-09-25 DIAGNOSIS — I10 Essential (primary) hypertension: Secondary | ICD-10-CM | POA: Diagnosis not present

## 2023-10-01 ENCOUNTER — Encounter: Payer: Self-pay | Admitting: Hematology and Oncology

## 2023-10-02 DIAGNOSIS — Z1331 Encounter for screening for depression: Secondary | ICD-10-CM | POA: Diagnosis not present

## 2023-10-02 DIAGNOSIS — Z0000 Encounter for general adult medical examination without abnormal findings: Secondary | ICD-10-CM | POA: Diagnosis not present

## 2023-10-02 DIAGNOSIS — C50919 Malignant neoplasm of unspecified site of unspecified female breast: Secondary | ICD-10-CM | POA: Diagnosis not present

## 2023-10-02 DIAGNOSIS — Z1339 Encounter for screening examination for other mental health and behavioral disorders: Secondary | ICD-10-CM | POA: Diagnosis not present

## 2023-10-02 DIAGNOSIS — R82998 Other abnormal findings in urine: Secondary | ICD-10-CM | POA: Diagnosis not present

## 2023-10-02 DIAGNOSIS — I10 Essential (primary) hypertension: Secondary | ICD-10-CM | POA: Diagnosis not present

## 2023-10-03 ENCOUNTER — Encounter: Payer: Self-pay | Admitting: Hematology and Oncology

## 2023-10-04 ENCOUNTER — Other Ambulatory Visit (HOSPITAL_BASED_OUTPATIENT_CLINIC_OR_DEPARTMENT_OTHER): Payer: Self-pay

## 2023-10-04 MED ORDER — PROPRANOLOL HCL 10 MG PO TABS
10.0000 mg | ORAL_TABLET | Freq: Two times a day (BID) | ORAL | 3 refills | Status: DC
  Filled 2023-10-04: qty 60, 30d supply, fill #0
  Filled 2023-11-03: qty 60, 30d supply, fill #1
  Filled 2023-12-02: qty 60, 30d supply, fill #2
  Filled 2023-12-30: qty 60, 30d supply, fill #3

## 2023-10-04 MED ORDER — HYDROCHLOROTHIAZIDE 25 MG PO TABS
25.0000 mg | ORAL_TABLET | Freq: Every morning | ORAL | 3 refills | Status: DC
  Filled 2023-10-04: qty 30, 30d supply, fill #0
  Filled 2023-11-03: qty 30, 30d supply, fill #1
  Filled 2023-12-02: qty 30, 30d supply, fill #2
  Filled 2023-12-30: qty 30, 30d supply, fill #3
  Filled 2024-01-29 (×3): qty 30, 30d supply, fill #4
  Filled 2024-03-04: qty 30, 30d supply, fill #5
  Filled 2024-04-08: qty 30, 30d supply, fill #6
  Filled 2024-05-05: qty 30, 30d supply, fill #7
  Filled 2024-06-03: qty 30, 30d supply, fill #8
  Filled 2024-07-07: qty 30, 30d supply, fill #9
  Filled 2024-08-11: qty 30, 30d supply, fill #10
  Filled 2024-09-16: qty 30, 30d supply, fill #11

## 2023-10-15 ENCOUNTER — Ambulatory Visit
Admission: RE | Admit: 2023-10-15 | Discharge: 2023-10-15 | Disposition: A | Discharge status: Home / Self Care | Payer: BC Managed Care – PPO | Source: Ambulatory Visit | Attending: Hematology and Oncology

## 2023-10-15 DIAGNOSIS — Z170 Estrogen receptor positive status [ER+]: Secondary | ICD-10-CM

## 2023-10-15 DIAGNOSIS — R928 Other abnormal and inconclusive findings on diagnostic imaging of breast: Secondary | ICD-10-CM | POA: Diagnosis not present

## 2023-10-15 DIAGNOSIS — C50411 Malignant neoplasm of upper-outer quadrant of right female breast: Secondary | ICD-10-CM

## 2023-10-15 DIAGNOSIS — N6311 Unspecified lump in the right breast, upper outer quadrant: Secondary | ICD-10-CM | POA: Diagnosis not present

## 2023-10-15 MED ORDER — GADOPICLENOL 0.5 MMOL/ML IV SOLN
7.0000 mL | Freq: Once | INTRAVENOUS | Status: AC | PRN
Start: 2023-10-15 — End: 2023-10-15
  Administered 2023-10-15: 7 mL via INTRAVENOUS

## 2023-10-16 ENCOUNTER — Other Ambulatory Visit: Payer: Self-pay | Admitting: Hematology and Oncology

## 2023-10-16 ENCOUNTER — Encounter: Payer: Self-pay | Admitting: Hematology and Oncology

## 2023-10-16 DIAGNOSIS — R928 Other abnormal and inconclusive findings on diagnostic imaging of breast: Secondary | ICD-10-CM

## 2023-10-16 NOTE — Telephone Encounter (Signed)
Reviewed the MRI results. Await biopsy results

## 2023-10-23 ENCOUNTER — Inpatient Hospital Stay
Admission: RE | Admit: 2023-10-23 | Discharge: 2023-10-23 | Disposition: A | Discharge status: Home / Self Care | Payer: BC Managed Care – PPO | Source: Ambulatory Visit | Attending: Hematology and Oncology

## 2023-10-23 ENCOUNTER — Ambulatory Visit
Admission: RE | Admit: 2023-10-23 | Discharge: 2023-10-23 | Disposition: A | Discharge status: Home / Self Care | Payer: BC Managed Care – PPO | Source: Ambulatory Visit | Attending: Hematology and Oncology | Admitting: Hematology and Oncology

## 2023-10-23 DIAGNOSIS — R928 Other abnormal and inconclusive findings on diagnostic imaging of breast: Secondary | ICD-10-CM

## 2023-10-23 DIAGNOSIS — N6311 Unspecified lump in the right breast, upper outer quadrant: Secondary | ICD-10-CM | POA: Diagnosis not present

## 2023-10-23 DIAGNOSIS — N6031 Fibrosclerosis of right breast: Secondary | ICD-10-CM | POA: Diagnosis not present

## 2023-10-23 DIAGNOSIS — I10 Essential (primary) hypertension: Secondary | ICD-10-CM | POA: Diagnosis not present

## 2023-10-23 MED ORDER — GADOPICLENOL 0.5 MMOL/ML IV SOLN
7.5000 mL | Freq: Once | INTRAVENOUS | Status: AC | PRN
  Administered 2023-10-23: 7.5 mL via INTRAVENOUS

## 2023-10-25 LAB — SURGICAL PATHOLOGY

## 2023-11-03 ENCOUNTER — Other Ambulatory Visit (HOSPITAL_BASED_OUTPATIENT_CLINIC_OR_DEPARTMENT_OTHER): Payer: Self-pay

## 2023-11-05 ENCOUNTER — Other Ambulatory Visit (HOSPITAL_BASED_OUTPATIENT_CLINIC_OR_DEPARTMENT_OTHER): Payer: Self-pay

## 2023-11-13 ENCOUNTER — Other Ambulatory Visit: Payer: Self-pay | Admitting: General Surgery

## 2023-11-13 DIAGNOSIS — N6311 Unspecified lump in the right breast, upper outer quadrant: Secondary | ICD-10-CM

## 2023-11-25 ENCOUNTER — Ambulatory Visit
Admission: RE | Admit: 2023-11-25 | Discharge: 2023-11-25 | Disposition: A | Discharge status: Home / Self Care | Payer: BC Managed Care – PPO | Source: Ambulatory Visit | Attending: General Surgery | Admitting: General Surgery

## 2023-11-25 ENCOUNTER — Other Ambulatory Visit (HOSPITAL_BASED_OUTPATIENT_CLINIC_OR_DEPARTMENT_OTHER): Payer: Self-pay

## 2023-11-25 DIAGNOSIS — N6311 Unspecified lump in the right breast, upper outer quadrant: Secondary | ICD-10-CM | POA: Diagnosis not present

## 2023-11-25 DIAGNOSIS — D0511 Intraductal carcinoma in situ of right breast: Secondary | ICD-10-CM | POA: Diagnosis not present

## 2023-11-25 MED ORDER — GADOPICLENOL 0.5 MMOL/ML IV SOLN
7.0000 mL | Freq: Once | INTRAVENOUS | Status: AC | PRN
  Administered 2023-11-25: 7 mL via INTRAVENOUS

## 2023-11-25 MED ORDER — AZITHROMYCIN 250 MG PO TABS
ORAL_TABLET | ORAL | 0 refills | Status: DC
  Filled 2023-11-25: qty 6, 5d supply, fill #0

## 2023-11-26 ENCOUNTER — Other Ambulatory Visit (HOSPITAL_BASED_OUTPATIENT_CLINIC_OR_DEPARTMENT_OTHER): Payer: Self-pay

## 2023-11-26 DIAGNOSIS — C44311 Basal cell carcinoma of skin of nose: Secondary | ICD-10-CM | POA: Diagnosis not present

## 2023-11-26 MED ORDER — MUPIROCIN 2 % EX OINT
TOPICAL_OINTMENT | CUTANEOUS | 0 refills | Status: DC
  Filled 2023-11-26: qty 22, 14d supply, fill #0

## 2023-11-28 ENCOUNTER — Other Ambulatory Visit (HOSPITAL_BASED_OUTPATIENT_CLINIC_OR_DEPARTMENT_OTHER): Payer: Self-pay

## 2023-11-29 DIAGNOSIS — N6311 Unspecified lump in the right breast, upper outer quadrant: Secondary | ICD-10-CM | POA: Diagnosis not present

## 2023-11-29 LAB — SURGICAL PATHOLOGY

## 2023-12-02 ENCOUNTER — Ambulatory Visit: Payer: BC Managed Care – PPO | Admitting: Hematology and Oncology

## 2023-12-02 ENCOUNTER — Telehealth: Payer: Self-pay | Admitting: Hematology and Oncology

## 2023-12-02 ENCOUNTER — Other Ambulatory Visit (HOSPITAL_BASED_OUTPATIENT_CLINIC_OR_DEPARTMENT_OTHER): Payer: Self-pay

## 2023-12-02 NOTE — Telephone Encounter (Signed)
 Called and left message for patient of scheduled appt for 01/07 @1115 .

## 2023-12-03 ENCOUNTER — Inpatient Hospital Stay: Payer: BC Managed Care – PPO | Attending: Hematology and Oncology | Admitting: Hematology and Oncology

## 2023-12-03 ENCOUNTER — Other Ambulatory Visit: Payer: Self-pay | Admitting: General Surgery

## 2023-12-03 VITALS — BP 143/92 | HR 65 | Temp 97.9°F | Resp 18 | Ht 66.0 in | Wt 149.7 lb

## 2023-12-03 DIAGNOSIS — Z79810 Long term (current) use of selective estrogen receptor modulators (SERMs): Secondary | ICD-10-CM | POA: Diagnosis not present

## 2023-12-03 DIAGNOSIS — Z923 Personal history of irradiation: Secondary | ICD-10-CM | POA: Insufficient documentation

## 2023-12-03 DIAGNOSIS — D0511 Intraductal carcinoma in situ of right breast: Secondary | ICD-10-CM

## 2023-12-03 DIAGNOSIS — C50411 Malignant neoplasm of upper-outer quadrant of right female breast: Secondary | ICD-10-CM | POA: Diagnosis not present

## 2023-12-03 DIAGNOSIS — Z170 Estrogen receptor positive status [ER+]: Secondary | ICD-10-CM | POA: Diagnosis not present

## 2023-12-03 NOTE — Assessment & Plan Note (Signed)
 12/01/2020:Right lumpectomy Destiny Mitchell): invasive and in situ ductal carcinoma, 1.6cm, grade 2, involved posterior margin, 2 right axillary lymph nodes negative for carcinoma.  ER greater than 95%, PR greater than 95%, Ki-67 10%, HER-2 negative Oncotype DX score: 5; risk of distant recurrence at 9 years: 3% Adjuvant radiation 01/17/2021-03/03/2021   Treatment plan: Tamoxifen  20 mg daily started 03/17/2021 Tamoxifen  Toxicities: Denies any major adverse effects with tamoxifen .  She is tolerating it fairly well.   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

## 2023-12-03 NOTE — Progress Notes (Signed)
 Patient Care Team: Avva, Ravisankar, MD as PCP - General (Internal Medicine) Odean Potts, MD as Consulting Physician (Hematology and Oncology) Ebbie Cough, MD as Consulting Physician (General Surgery) Shannon Agent, MD as Consulting Physician (Radiation Oncology) Crawford Morna Pickle, NP as Nurse Practitioner (Hematology and Oncology) Dove, Natro D, RN as Registered Nurse  DIAGNOSIS:  Encounter Diagnosis  Name Primary?   Malignant neoplasm of upper-outer quadrant of right breast in female, estrogen receptor positive ([facility]) Yes    SUMMARY OF ONCOLOGIC HISTORY: Oncology History  Malignant neoplasm of upper-outer quadrant of right breast in female, estrogen receptor positive ([facility])  10/31/2020 Initial Diagnosis    Screening detected right breast mass 10:30 position 1.7 cm by ultrasound, numerous benign-appearing cysts largest measuring 1.3 cm.  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   11/04/2020 Cancer Staging   Staging form: Breast, AJCC 8th Edition - Clinical: Stage IA (cT1c, cN0, cM0, G2, ER+, PR+, HER2-) - Signed by Odean Potts, MD on 11/04/2020   11/11/2020 Genetic Testing   Negative genetic testing on the STAT and Multicancer panel.  The Multi-Gene Panel offered by [facility] includes sequencing and/or deletion duplication testing of the following 85 genes: AIP, ALK, APC, ATM, AXIN2,BAP1,  BARD1, BLM, BMPR1A, BRCA1, BRCA2, BRIP1, CASR, CDC73, CDH1, CDK4, CDKN1B, CDKN1C, CDKN2A (p14ARF), CDKN2A (p16INK4a), CEBPA, CHEK2, CTNNA1, DICER1, DIS3L2, EGFR (c.2369C>T, p.Thr790Met variant only), EPCAM (Deletion/duplication testing only), FH, FLCN, GATA2, GPC3, GREM1 (Promoter region deletion/duplication testing only), HOXB13 (c.251G>A, p.Gly84Glu), HRAS, KIT, MAX, MEN1, MET, MITF (c.952G>A, p.Glu318Lys variant only), MLH1, MSH2, MSH3, MSH6, MUTYH, NBN, NF1, NF2, NTHL1, PALB2, PDGFRA, PHOX2B,  PMS2, POLD1, POLE, POT1, PRKAR1A, PTCH1, PTEN, RAD50, RAD51C, RAD51D, RB1, RECQL4, RET, RNF43, RUNX1, SDHAF2, SDHA (sequence changes only), SDHB, SDHC, SDHD, SMAD4, SMARCA4, SMARCB1, SMARCE1, STK11, SUFU, TERC, TERT, TMEM127, TP53, TSC1, TSC2, VHL, WRN and WT1.  The report date is 11/13/2020.   12/01/2020 Surgery   Right lumpectomy Viktoria): invasive and in situ ductal carcinoma, 1.6cm, grade 2, involved posterior margin, 2 right axillary lymph nodes negative for carcinoma.   12/16/2020 Oncotype testing   Oncotype DX score: 5; risk of distant recurrence at 9 years: 3%   01/17/2021 - 03/03/2021 Radiation Therapy   Adjuvant radiation    03/15/2021 -  Anti-estrogen oral therapy   Tamoxifen  20 mg daily     CHIEF COMPLIANT: Follow-up to discuss results of recent biopsy  HISTORY OF PRESENT ILLNESS:   History of Present Illness   The patient, with a history of breast cancer, presents for a follow-up visit after a recent biopsy. She has been on tamoxifen  for the past three years. The patient reports that she has been adhering to the medication regimen despite disliking it. She expresses concern about the recurrence of cancerous lesions, despite being on tamoxifen  and having undergone radiation therapy in the past. The patient also mentions a genetic test she had done five years ago, which was negative for any genetic predisposition to cancer. She expresses anxiety about the possibility of future mastectomies and the stress of annual screenings.         ALLERGIES:  has no known allergies.  MEDICATIONS:  Current Outpatient Medications  Medication Sig Dispense Refill   azithromycin  (ZITHROMAX ) 250 MG tablet Take as directed on packaging for 5 total days. 6 tablet 0   hydrochlorothiazide  (HYDRODIURIL ) 25 MG tablet Take 1 tablet (25 mg total) by mouth every morning. 90 tablet 3  levonorgestrel  (LILETTA ) 20.1 MCG/DAY IUD Liletta  20.1 mcg/24 hrs (6 yrs) 52 mg intrauterine device  Take 1 insert by  intrauterine route.     mometasone  (ELOCON ) 0.1 % cream Apply a small amount to skin twice a day 45 g 1   mupirocin  ointment (BACTROBAN ) 2 % Apply a small amount to affected area(s) once daily 22 g 0   propranolol  (INDERAL ) 10 MG tablet Take 1-2 tablets (10-20 mg total) by mouth every 8 (eight) hours as needed for palpitations. 30 tablet 3   propranolol  (INDERAL ) 10 MG tablet Take 1 tablet (10 mg total) by mouth every 12 (twelve) hours on an empty stomach 60 tablet 3   tamoxifen  (NOLVADEX ) 20 MG tablet Take 1 tablet (20 mg total) by mouth daily. 90 tablet 3   No current facility-administered medications for this visit.    PHYSICAL EXAMINATION: ECOG PERFORMANCE STATUS: 1 - Symptomatic but completely ambulatory  Vitals:   12/03/23 1127  BP: (!) 143/92  Pulse: 65  Resp: 18  Temp: 97.9 F (36.6 C)  SpO2: 100%   Filed Weights   12/03/23 1127  Weight: 149 lb 11.2 oz (67.9 kg)      LABORATORY DATA:  I have reviewed the data as listed    Latest Ref Rng & Units 11/24/2020    3:32 PM 04/07/2018    8:17 AM 04/04/2017    8:56 AM  CMP  Glucose 70 - 99 mg/dL 889  86    BUN 6 - 20 mg/dL 23  17    Creatinine 9.55 - 1.00 mg/dL 9.18  9.07    Sodium 864 - 145 mmol/L 137  139    Potassium 3.5 - 5.1 mmol/L 3.2  4.0    Chloride 98 - 111 mmol/L 101  100    CO2 22 - 32 mmol/L 26  25    Calcium  8.9 - 10.3 mg/dL 9.8  9.2    Total Protein 6.0 - 8.5 g/dL  7.1  7.1   Total Bilirubin 0.0 - 1.2 mg/dL  0.6  0.5   Alkaline Phos 39 - 117 IU/L  64  72   AST 0 - 40 IU/L  17  17   ALT 0 - 32 IU/L  13  9     Lab Results  Component Value Date   WBC 4.3 04/07/2018   HGB 13.1 04/07/2018   HCT 39.6 04/07/2018   MCV 81 04/07/2018   PLT 219 04/07/2018   NEUTROABS 2.7 04/07/2018    ASSESSMENT & PLAN:  Malignant neoplasm of upper-outer quadrant of right breast in female, estrogen receptor positive ([facility]) 12/01/2020:Right lumpectomy Viktoria): invasive and in situ ductal carcinoma, 1.6cm, grade 2,  involved posterior margin, 2 right axillary lymph nodes negative for carcinoma.  ER greater than 95%, PR greater than 95%, Ki-67 10%, HER-2 negative Oncotype DX score: 5; risk of distant recurrence at 9 years: 3% Adjuvant radiation 01/17/2021-03/03/2021   Treatment plan: Tamoxifen  20 mg daily started 03/17/2021 Tamoxifen  Toxicities: Denies any major adverse effects with tamoxifen .  She is tolerating it fairly well.   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  This way we would not have to do  frequent breast MRIs.    No orders of the defined types were placed in this encounter.  The patient has a good understanding of the overall plan. she agrees with it. she will call with any problems that may develop before the next visit here. Total time spent: 30 mins including face to face time and time spent for planning, charting and co-ordination of care   Viinay K Ronald Londo, MD 12/03/23

## 2023-12-04 ENCOUNTER — Other Ambulatory Visit: Payer: Self-pay | Admitting: General Surgery

## 2023-12-04 DIAGNOSIS — D0511 Intraductal carcinoma in situ of right breast: Secondary | ICD-10-CM

## 2023-12-05 ENCOUNTER — Encounter: Payer: Self-pay | Admitting: *Deleted

## 2023-12-05 ENCOUNTER — Other Ambulatory Visit (HOSPITAL_BASED_OUTPATIENT_CLINIC_OR_DEPARTMENT_OTHER): Payer: Self-pay

## 2023-12-11 ENCOUNTER — Other Ambulatory Visit (HOSPITAL_BASED_OUTPATIENT_CLINIC_OR_DEPARTMENT_OTHER): Payer: Self-pay

## 2023-12-11 MED ORDER — CEFDINIR 300 MG PO CAPS
300.0000 mg | ORAL_CAPSULE | Freq: Two times a day (BID) | ORAL | 0 refills | Status: DC
  Filled 2023-12-11: qty 14, 7d supply, fill #0

## 2023-12-18 ENCOUNTER — Other Ambulatory Visit: Payer: Self-pay

## 2023-12-18 ENCOUNTER — Encounter (HOSPITAL_BASED_OUTPATIENT_CLINIC_OR_DEPARTMENT_OTHER): Payer: Self-pay | Admitting: General Surgery

## 2023-12-20 ENCOUNTER — Encounter (HOSPITAL_BASED_OUTPATIENT_CLINIC_OR_DEPARTMENT_OTHER)
Admission: RE | Admit: 2023-12-20 | Discharge: 2023-12-20 | Disposition: A | Discharge status: Home / Self Care | Payer: BC Managed Care – PPO | Source: Ambulatory Visit | Attending: General Surgery

## 2023-12-20 DIAGNOSIS — R9431 Abnormal electrocardiogram [ECG] [EKG]: Secondary | ICD-10-CM | POA: Diagnosis not present

## 2023-12-20 DIAGNOSIS — Z01818 Encounter for other preprocedural examination: Secondary | ICD-10-CM | POA: Insufficient documentation

## 2023-12-20 DIAGNOSIS — Z01810 Encounter for preprocedural cardiovascular examination: Secondary | ICD-10-CM | POA: Diagnosis not present

## 2023-12-20 DIAGNOSIS — Z01812 Encounter for preprocedural laboratory examination: Secondary | ICD-10-CM | POA: Diagnosis not present

## 2023-12-20 LAB — BASIC METABOLIC PANEL
Anion gap: 9 (ref 5–15)
BUN: 23 mg/dL — ABNORMAL HIGH (ref 6–20)
CO2: 29 mmol/L (ref 22–32)
Calcium: 9.5 mg/dL (ref 8.9–10.3)
Chloride: 100 mmol/L (ref 98–111)
Creatinine, Ser: 1 mg/dL (ref 0.44–1.00)
GFR, Estimated: >60 mL/min (ref >60)
Glucose, Bld: 112 mg/dL — ABNORMAL HIGH (ref 70–99)
Potassium: 3.8 mmol/L (ref 3.5–5.1)
Sodium: 138 mmol/L (ref 135–145)

## 2023-12-20 MED ORDER — CHLORHEXIDINE GLUCONATE CLOTH 2 % EX PADS: 6.0000 | MEDICATED_PAD | Freq: Once | CUTANEOUS | Status: DC

## 2023-12-20 NOTE — Progress Notes (Signed)

## 2023-12-24 ENCOUNTER — Encounter (HOSPITAL_COMMUNITY)
Admission: RE | Admit: 2023-12-24 | Discharge: 2023-12-24 | Disposition: A | Discharge status: Home / Self Care | Payer: BC Managed Care – PPO | Source: Ambulatory Visit | Attending: General Surgery | Admitting: General Surgery

## 2023-12-24 ENCOUNTER — Ambulatory Visit
Admission: RE | Admit: 2023-12-24 | Discharge: 2023-12-24 | Disposition: A | Discharge status: Home / Self Care | Payer: BC Managed Care – PPO | Source: Ambulatory Visit | Attending: General Surgery | Admitting: General Surgery

## 2023-12-24 DIAGNOSIS — Z170 Estrogen receptor positive status [ER+]: Secondary | ICD-10-CM | POA: Diagnosis not present

## 2023-12-24 DIAGNOSIS — D0511 Intraductal carcinoma in situ of right breast: Secondary | ICD-10-CM

## 2023-12-24 DIAGNOSIS — Z1721 Progesterone receptor positive status: Secondary | ICD-10-CM | POA: Diagnosis not present

## 2023-12-24 DIAGNOSIS — Z01812 Encounter for preprocedural laboratory examination: Secondary | ICD-10-CM | POA: Insufficient documentation

## 2023-12-24 DIAGNOSIS — I10 Essential (primary) hypertension: Secondary | ICD-10-CM | POA: Diagnosis not present

## 2023-12-24 DIAGNOSIS — Z01818 Encounter for other preprocedural examination: Secondary | ICD-10-CM

## 2023-12-24 HISTORY — PX: BREAST BIOPSY: SHX20

## 2023-12-24 LAB — POCT PREGNANCY, URINE: Preg Test, Ur: NEGATIVE

## 2023-12-24 NOTE — Progress Notes (Signed)
Surgical Instructions   Your procedure is scheduled on Wednesday, January 29th, 2025. Report to [facility] Main Entrance "A" at 7:30 A.M., then check in with the Admitting office. Any questions or running late day of surgery: call [phone]  Questions prior to your surgery date: call [phone], Monday-Friday, 8am-4pm. If you experience any cold or flu symptoms such as cough, fever, chills, shortness of breath, etc. between now and your scheduled surgery, please notify us at the above number.     Remember:  Do not eat after midnight the night before your surgery   You may drink clear liquids until 7:00 the morning of your surgery.   Clear liquids allowed are: Water, Non-Citrus Juices (without pulp), Carbonated Beverages, Clear Tea (no milk, honey, etc.), Black Coffee Only (NO MILK, CREAM OR POWDERED CREAMER of any kind), and Gatorade.  Patient Instructions  The night before surgery:  No food after midnight. ONLY clear liquids after midnight  The day of surgery (if you do NOT have diabetes):  Drink ONE (1) Pre-Surgery Clear Ensure by 7:00 the morning of surgery. Drink in one sitting. Do not sip.  This drink was given to you during your hospital  pre-op appointment visit.  Nothing else to drink after completing the  Pre-Surgery Clear Ensure.         If you have questions, please contact your surgeon's office.     Take these medicines the morning of surgery with A SIP OF WATER:  Propranolol (Inderal) Tamoxifen (Nolvadex)   May take these medicines IF NEEDED: Propranolol (Inderal)    One week prior to surgery, STOP taking any Aspirin (unless otherwise instructed by your surgeon) Aleve, Naproxen, Ibuprofen, Motrin, Advil, Goody's, BC's, all herbal medications, fish oil, and non-prescription vitamins.                     Do NOT Smoke (Tobacco/Vaping) for 24 hours prior to your procedure.  If you use a CPAP at night, you may bring your mask/headgear for your overnight  stay.   You will be asked to remove any contacts, glasses, piercing's, hearing aid's, dentures/partials prior to surgery. Please bring cases for these items if needed.    Patients discharged the day of surgery will not be allowed to drive home, and someone needs to stay with them for 24 hours.  SURGICAL WAITING ROOM VISITATION Patients may have no more than 2 support people in the waiting area - these visitors may rotate.   Pre-op nurse will coordinate an appropriate time for 1 ADULT support person, who may not rotate, to accompany patient in pre-op.  Children under the age of 61 must have an adult with them who is not the patient and must remain in the main waiting area with an adult.  If the patient needs to stay at the hospital during part of their recovery, the visitor guidelines for inpatient rooms apply.  Please refer to the [facility] website for the visitor guidelines for any additional information.   If you received a COVID test during your pre-op visit  it is requested that you wear a mask when out in public, stay away from anyone that may not be feeling well and notify your surgeon if you develop symptoms. If you have been in contact with anyone that has tested positive in the last 10 days please notify you surgeon.      Pre-operative CHG Bathing Instructions   You can play a key role in reducing the risk of  infection after surgery. Your skin needs to be as free of germs as possible. You can reduce the number of germs on your skin by washing with CHG (chlorhexidine gluconate) soap before surgery. CHG is an antiseptic soap that kills germs and continues to kill germs even after washing.   DO NOT use if you have an allergy to chlorhexidine/CHG or antibacterial soaps. If your skin becomes reddened or irritated, stop using the CHG and notify one of our RNs at [phone].              TAKE A SHOWER THE NIGHT BEFORE SURGERY AND THE DAY OF SURGERY    Please keep in mind the  following:  DO NOT shave, including legs and underarms, 48 hours prior to surgery.   You may shave your face before/day of surgery.  Place clean sheets on your bed the night before surgery Use a clean washcloth (not used since being washed) for each shower. DO NOT sleep with pet's night before surgery.  CHG Shower Instructions:  Wash your face and private area with normal soap. If you choose to wash your hair, wash first with your normal shampoo.  After you use shampoo/soap, rinse your hair and body thoroughly to remove shampoo/soap residue.  Turn the water OFF and apply half the bottle of CHG soap to a CLEAN washcloth.  Apply CHG soap ONLY FROM YOUR NECK DOWN TO YOUR TOES (washing for 3-5 minutes)  DO NOT use CHG soap on face, private areas, open wounds, or sores.  Pay special attention to the area where your surgery is being performed.  If you are having back surgery, having someone wash your back for you may be helpful. Wait 2 minutes after CHG soap is applied, then you may rinse off the CHG soap.  Pat dry with a clean towel  Put on clean pajamas    Additional instructions for the day of surgery: DO NOT APPLY any lotions, deodorants, cologne, or perfumes.   Do not wear jewelry or makeup Do not wear nail polish, gel polish, artificial nails, or any other type of covering on natural nails (fingers and toes) Do not bring valuables to the hospital. [facility] is not responsible for valuables/personal belongings. Put on clean/comfortable clothes.  Please brush your teeth.  Ask your nurse before applying any prescription medications to the skin.

## 2023-12-24 NOTE — Progress Notes (Signed)
Patient aware of updated surgery time and location.  Patient scheduled to have urine HCG POCT prior to seed placement.

## 2023-12-25 ENCOUNTER — Ambulatory Visit (HOSPITAL_COMMUNITY): Payer: BC Managed Care – PPO | Admitting: Anesthesiology

## 2023-12-25 ENCOUNTER — Ambulatory Visit (HOSPITAL_COMMUNITY)
Admission: RE | Admit: 2023-12-25 | Discharge: 2023-12-25 | Disposition: A | Discharge status: Home / Self Care | Payer: BC Managed Care – PPO | Attending: General Surgery | Admitting: General Surgery

## 2023-12-25 ENCOUNTER — Encounter (HOSPITAL_COMMUNITY)
Admission: RE | Disposition: A | Discharge status: Home / Self Care | Payer: Self-pay | Source: Home / Self Care | Attending: General Surgery

## 2023-12-25 ENCOUNTER — Encounter (HOSPITAL_COMMUNITY): Payer: Self-pay | Admitting: General Surgery

## 2023-12-25 ENCOUNTER — Ambulatory Visit
Admission: RE | Admit: 2023-12-25 | Discharge: 2023-12-25 | Disposition: A | Discharge status: Home / Self Care | Payer: BC Managed Care – PPO | Source: Ambulatory Visit | Attending: General Surgery | Admitting: General Surgery

## 2023-12-25 ENCOUNTER — Other Ambulatory Visit: Payer: Self-pay

## 2023-12-25 DIAGNOSIS — Z170 Estrogen receptor positive status [ER+]: Secondary | ICD-10-CM | POA: Diagnosis not present

## 2023-12-25 DIAGNOSIS — D0511 Intraductal carcinoma in situ of right breast: Secondary | ICD-10-CM

## 2023-12-25 DIAGNOSIS — Z1721 Progesterone receptor positive status: Secondary | ICD-10-CM | POA: Insufficient documentation

## 2023-12-25 DIAGNOSIS — I10 Essential (primary) hypertension: Secondary | ICD-10-CM | POA: Insufficient documentation

## 2023-12-25 DIAGNOSIS — N6031 Fibrosclerosis of right breast: Secondary | ICD-10-CM | POA: Diagnosis not present

## 2023-12-25 DIAGNOSIS — N6310 Unspecified lump in the right breast, unspecified quadrant: Secondary | ICD-10-CM | POA: Diagnosis not present

## 2023-12-25 DIAGNOSIS — Z01818 Encounter for other preprocedural examination: Secondary | ICD-10-CM

## 2023-12-25 HISTORY — DX: Other complications of anesthesia, initial encounter: T88.59XA

## 2023-12-25 HISTORY — PX: BREAST LUMPECTOMY WITH RADIOACTIVE SEED LOCALIZATION: SHX6424

## 2023-12-25 HISTORY — DX: Other specified postprocedural states: Z98.890

## 2023-12-25 LAB — CBC
HCT: 44.9 % (ref 36.0–46.0)
Hemoglobin: 15.1 g/dL — ABNORMAL HIGH (ref 12.0–15.0)
MCH: 28.8 pg (ref 26.0–34.0)
MCHC: 33.6 g/dL (ref 30.0–36.0)
MCV: 85.5 fL (ref 80.0–100.0)
Platelets: 201 10*3/uL (ref 150–400)
RBC: 5.25 MIL/uL — ABNORMAL HIGH (ref 3.87–5.11)
RDW: 12.8 % (ref 11.5–15.5)
WBC: 5.2 10*3/uL (ref 4.0–10.5)
nRBC: 0 % (ref 0.0–0.2)

## 2023-12-25 SURGERY — BREAST LUMPECTOMY WITH RADIOACTIVE SEED LOCALIZATION
Anesthesia: General | Site: Breast | Laterality: Right

## 2023-12-25 MED ORDER — BUPIVACAINE-EPINEPHRINE (PF) 0.25% -1:200000 IJ SOLN
INTRAMUSCULAR | Status: AC
  Filled 2023-12-25: qty 30

## 2023-12-25 MED ORDER — SODIUM CHLORIDE 0.9 % IV SOLN: 250.0000 mL | INTRAVENOUS | Status: DC | PRN

## 2023-12-25 MED ORDER — OXYCODONE HCL 5 MG/5ML PO SOLN: 5.0000 mg | Freq: Once | ORAL | Status: DC | PRN

## 2023-12-25 MED ORDER — SODIUM CHLORIDE 0.9 % IV SOLN: 12.5000 mg | INTRAVENOUS | Status: DC | PRN

## 2023-12-25 MED ORDER — OXYCODONE HCL 5 MG PO TABS: 5.0000 mg | ORAL_TABLET | ORAL | Status: DC | PRN

## 2023-12-25 MED ORDER — ACETAMINOPHEN 650 MG RE SUPP: 650.0000 mg | RECTAL | Status: DC | PRN

## 2023-12-25 MED ORDER — ONDANSETRON HCL 4 MG/2ML IJ SOLN
INTRAMUSCULAR | Status: DC | PRN
  Administered 2023-12-25: 4 mg via INTRAVENOUS

## 2023-12-25 MED ORDER — CHLORHEXIDINE GLUCONATE 0.12 % MT SOLN: 15.0000 mL | Freq: Once | OROMUCOSAL | Status: AC

## 2023-12-25 MED ORDER — AMISULPRIDE (ANTIEMETIC) 5 MG/2ML IV SOLN: 10.0000 mg | Freq: Once | INTRAVENOUS | Status: DC | PRN

## 2023-12-25 MED ORDER — EPHEDRINE SULFATE-NACL 50-0.9 MG/10ML-% IV SOSY
PREFILLED_SYRINGE | INTRAVENOUS | Status: DC | PRN
  Administered 2023-12-25 (×2): 5 mg via INTRAVENOUS

## 2023-12-25 MED ORDER — ENSURE PRE-SURGERY PO LIQD
296.0000 mL | Freq: Once | ORAL | Status: DC
Start: 2023-12-26 — End: 2023-12-25

## 2023-12-25 MED ORDER — PROPOFOL 10 MG/ML IV BOLUS
INTRAVENOUS | Status: AC
  Filled 2023-12-25: qty 20

## 2023-12-25 MED ORDER — BUPIVACAINE-EPINEPHRINE 0.25% -1:200000 IJ SOLN
INTRAMUSCULAR | Status: DC | PRN
  Administered 2023-12-25: 10 mL

## 2023-12-25 MED ORDER — ONDANSETRON HCL 4 MG/2ML IJ SOLN
INTRAMUSCULAR | Status: AC
  Filled 2023-12-25: qty 2

## 2023-12-25 MED ORDER — PROPOFOL 10 MG/ML IV BOLUS
INTRAVENOUS | Status: DC | PRN
  Administered 2023-12-25: 200 mg via INTRAVENOUS

## 2023-12-25 MED ORDER — OXYCODONE HCL 5 MG PO TABS: 5.0000 mg | ORAL_TABLET | Freq: Once | ORAL | Status: DC | PRN

## 2023-12-25 MED ORDER — SODIUM CHLORIDE 0.9% FLUSH: 3.0000 mL | Freq: Two times a day (BID) | INTRAVENOUS | Status: DC

## 2023-12-25 MED ORDER — LACTATED RINGERS IV SOLN: INTRAVENOUS | Status: DC

## 2023-12-25 MED ORDER — LIDOCAINE 2% (20 MG/ML) 5 ML SYRINGE
INTRAMUSCULAR | Status: DC | PRN
  Administered 2023-12-25: 60 mg via INTRAVENOUS

## 2023-12-25 MED ORDER — LIDOCAINE 2% (20 MG/ML) 5 ML SYRINGE
INTRAMUSCULAR | Status: AC
  Filled 2023-12-25: qty 5

## 2023-12-25 MED ORDER — MIDAZOLAM HCL 2 MG/2ML IJ SOLN
INTRAMUSCULAR | Status: DC | PRN
  Administered 2023-12-25: 2 mg via INTRAVENOUS

## 2023-12-25 MED ORDER — DEXAMETHASONE SODIUM PHOSPHATE 10 MG/ML IJ SOLN
INTRAMUSCULAR | Status: AC
  Filled 2023-12-25: qty 1

## 2023-12-25 MED ORDER — ACETAMINOPHEN 325 MG PO TABS: 650.0000 mg | ORAL_TABLET | ORAL | Status: DC | PRN

## 2023-12-25 MED ORDER — MIDAZOLAM HCL 2 MG/2ML IJ SOLN
INTRAMUSCULAR | Status: AC
  Filled 2023-12-25: qty 2

## 2023-12-25 MED ORDER — FENTANYL CITRATE (PF) 250 MCG/5ML IJ SOLN
INTRAMUSCULAR | Status: DC | PRN
  Administered 2023-12-25: 25 ug via INTRAVENOUS

## 2023-12-25 MED ORDER — CHLORHEXIDINE GLUCONATE 0.12 % MT SOLN
OROMUCOSAL | Status: AC
  Administered 2023-12-25: 15 mL via OROMUCOSAL
  Filled 2023-12-25: qty 15

## 2023-12-25 MED ORDER — SODIUM CHLORIDE 0.9% FLUSH: 3.0000 mL | INTRAVENOUS | Status: DC | PRN

## 2023-12-25 MED ORDER — CEFAZOLIN SODIUM-DEXTROSE 2-4 GM/100ML-% IV SOLN
2.0000 g | INTRAVENOUS | Status: AC
  Administered 2023-12-25: 2 g via INTRAVENOUS
  Filled 2023-12-25: qty 100

## 2023-12-25 MED ORDER — ACETAMINOPHEN 500 MG PO TABS
1000.0000 mg | ORAL_TABLET | ORAL | Status: AC
  Administered 2023-12-25: 1000 mg via ORAL
  Filled 2023-12-25: qty 2

## 2023-12-25 MED ORDER — HYDROMORPHONE HCL 1 MG/ML IJ SOLN: 0.2500 mg | INTRAMUSCULAR | Status: DC | PRN

## 2023-12-25 MED ORDER — MEPERIDINE HCL 25 MG/ML IJ SOLN: 6.2500 mg | INTRAMUSCULAR | Status: DC | PRN

## 2023-12-25 MED ORDER — 0.9 % SODIUM CHLORIDE (POUR BTL) OPTIME
TOPICAL | Status: DC | PRN
  Administered 2023-12-25: 1000 mL

## 2023-12-25 MED ORDER — KETOROLAC TROMETHAMINE 15 MG/ML IJ SOLN
15.0000 mg | INTRAMUSCULAR | Status: AC
  Administered 2023-12-25: 15 mg via INTRAVENOUS
  Filled 2023-12-25: qty 1

## 2023-12-25 MED ORDER — DEXAMETHASONE SODIUM PHOSPHATE 10 MG/ML IJ SOLN
INTRAMUSCULAR | Status: DC | PRN
  Administered 2023-12-25: 10 mg via INTRAVENOUS

## 2023-12-25 MED ORDER — FENTANYL CITRATE (PF) 250 MCG/5ML IJ SOLN
INTRAMUSCULAR | Status: AC
  Filled 2023-12-25: qty 5

## 2023-12-25 SURGICAL SUPPLY — 33 items
APPLIER CLIP 9.375 MED OPEN (MISCELLANEOUS) ×1
BAG COUNTER SPONGE SURGICOUNT (BAG) ×2 IMPLANT
BINDER BREAST LRG (GAUZE/BANDAGES/DRESSINGS) IMPLANT
CANISTER SUCT 3000ML PPV (MISCELLANEOUS) ×2 IMPLANT
CHLORAPREP W/TINT 26 (MISCELLANEOUS) ×2 IMPLANT
CLIP APPLIE 9.375 MED OPEN (MISCELLANEOUS) IMPLANT
CLIP TI MEDIUM 6 (CLIP) ×2 IMPLANT
COVER PROBE W GEL 5X96 (DRAPES) ×2 IMPLANT
COVER SURGICAL LIGHT HANDLE (MISCELLANEOUS) ×2 IMPLANT
DERMABOND ADVANCED .7 DNX12 (GAUZE/BANDAGES/DRESSINGS) ×2 IMPLANT
DEVICE DUBIN SPECIMEN MAMMOGRA (MISCELLANEOUS) ×2 IMPLANT
DRAPE CHEST BREAST 15X10 FENES (DRAPES) ×2 IMPLANT
ELECT COATED BLADE 2.86 ST (ELECTRODE) ×2 IMPLANT
ELECT REM PT RETURN 9FT ADLT (ELECTROSURGICAL) ×1
ELECTRODE REM PT RTRN 9FT ADLT (ELECTROSURGICAL) ×2 IMPLANT
GLOVE BIO SURGEON STRL SZ7 (GLOVE) ×4 IMPLANT
GLOVE BIOGEL PI IND STRL 7.5 (GLOVE) ×2 IMPLANT
GOWN STRL REUS W/ TWL LRG LVL3 (GOWN DISPOSABLE) ×4 IMPLANT
KIT BASIN OR (CUSTOM PROCEDURE TRAY) ×2 IMPLANT
KIT MARKER MARGIN INK (KITS) ×2 IMPLANT
NDL HYPO 25GX1X1/2 BEV (NEEDLE) ×2 IMPLANT
NEEDLE HYPO 25GX1X1/2 BEV (NEEDLE) ×1
NS IRRIG 1000ML POUR BTL (IV SOLUTION) ×2 IMPLANT
PACK GENERAL/GYN (CUSTOM PROCEDURE TRAY) ×2 IMPLANT
STRIP CLOSURE SKIN 1/2X4 (GAUZE/BANDAGES/DRESSINGS) ×2 IMPLANT
SUT MNCRL AB 4-0 PS2 18 (SUTURE) ×2 IMPLANT
SUT MON AB 5-0 PS2 18 (SUTURE) IMPLANT
SUT SILK 2 0 SH (SUTURE) IMPLANT
SUT VIC AB 2-0 SH 27XBRD (SUTURE) ×2 IMPLANT
SUT VIC AB 3-0 SH 27X BRD (SUTURE) ×2 IMPLANT
SYR CONTROL 10ML LL (SYRINGE) ×2 IMPLANT
TOWEL GREEN STERILE (TOWEL DISPOSABLE) ×2 IMPLANT
TOWEL GREEN STERILE FF (TOWEL DISPOSABLE) ×2 IMPLANT

## 2023-12-25 NOTE — Anesthesia Postprocedure Evaluation (Signed)
Anesthesia Post Note  Patient: Destiny Mitchell  Procedure(s) Performed: RIGHT BREAST SEED GUIDED LUMPECTOMY (Right: Breast)     Patient location during evaluation: PACU Anesthesia Type: General Level of consciousness: awake and alert Pain management: pain level controlled Vital Signs Assessment: post-procedure vital signs reviewed and stable Respiratory status: spontaneous breathing, nonlabored ventilation and respiratory function stable Cardiovascular status: blood pressure returned to baseline and stable Postop Assessment: no apparent nausea or vomiting Anesthetic complications: no   No notable events documented.  Last Vitals:  Vitals:   12/25/23 1100 12/25/23 1115  BP: (!) 138/97 (!) 134/93  Pulse: 61 61  Resp: 16 20  Temp:  36.4 C  SpO2: 94% 97%    Last Pain:  Vitals:   12/25/23 1043  TempSrc:   PainSc: 0-No pain                 Lowella Curb

## 2023-12-25 NOTE — Op Note (Signed)
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 Harden Mo Anesthesia: General Specimens: right breast tissue containing seed and clip to pathology Additional superior, lateral,medial, posterior and inferior margins marked short superior, long lateral double deep Estimated blood loss: Minimal Complications: None Drains: none Sponge no count was correct completion Disposition to recovery stable condition   Indications: 48 y.o. female who is seen today for breast cancer follow-up. She is doing well and has no complaints. She has some soreness around the right breast. She has no mass or any discharge. She has returned to all her normal activity at this point. She has no limitations and no upper extremity issues. Her history is significant for a lumpectomy sentinel node in January 2022. 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 The invasive ductal carcinoma went to the posterior margin but this was the fascia on the pack and I have cleared this completely. There is no gross invasion of her muscle at all. She had 2 sentinel lymph nodes that are negative. Her Oncotype is 5. She has completed radiotherapy. Taking tam. She has been followed with mr per oncology. She was undergoing a follow up for uoq nme. On 11/19 had mr that showed stable appearance of that but a developing 4 mm mass inferior to lumpectomy site. Underwent one biopsy but was not thought to have sampled had another. 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 This appears to be possible dcis. I elected to excise this to ensure diagnosis and remove.    Procedure: After informed consent was obtained she was taken to the operating room.  She had had a seed placed prior to beginning I had these mammograms available.  She had SCDs in place.  She was given antibiotics.  She was  placed under general anesthesia without complication.  She was prepped and draped in a standard sterile surgical fashion.  A surgical timeout was then performed.   The seed was located in the upper outer quadrant near old incision.  I infiltrated Marcaine.  I then excised the old incision and then used the neoprobe to dissect to the seed.   I removed the seed and some of the surrounding tissue.  Mammogram confirmed removal of the seed and the clip. I removed an old clip also. I thought I might be close to margins so I shaved cavity. The posterior margin is the muscle.   I then obtained hemostasis.  I closed this with 2-0 Vicryl.  The skin was closed with 3-0 Vicryl and 4-0 Monocryl.  Glue and Steri-Strips were applied.  She tolerated this well and was transferred recovery stable.

## 2023-12-25 NOTE — Interval H&P Note (Signed)
History and Physical Interval Note:  12/25/2023 9:15 AM  Destiny Mitchell  has presented today for surgery, with the diagnosis of RIGHT BREAST DUCTAL CARCINOMA IN SITU.  The various methods of treatment have been discussed with the patient and family. After consideration of risks, benefits and other options for treatment, the patient has consented to  Procedure(s): RIGHT BREAST SEED GUIDED LUMPECTOMY (Right) as a surgical intervention.  The patient's history has been reviewed, patient examined, no change in status, stable for surgery.  I have reviewed the patient's chart and labs.  Questions were answered to the patient's satisfaction.     Emelia Loron

## 2023-12-25 NOTE — Anesthesia Preprocedure Evaluation (Signed)
Anesthesia Evaluation  Patient identified by MRN, date of birth, ID band Patient awake    Reviewed: Allergy & Precautions, NPO status , Patient's Chart, lab work & pertinent test results  History of Anesthesia Complications (+) PONV and history of anesthetic complications  Airway Mallampati: II  TM Distance: >3 FB Neck ROM: Full    Dental  (+) Teeth Intact, Dental Advisory Given   Pulmonary    breath sounds clear to auscultation       Cardiovascular hypertension, Pt. on medications  Rhythm:Regular Rate:Normal     Neuro/Psych   Anxiety        GI/Hepatic   Endo/Other    Renal/GU      Musculoskeletal   Abdominal   Peds  Hematology   Anesthesia Other Findings   Reproductive/Obstetrics                             Anesthesia Physical Anesthesia Plan  ASA: 2  Anesthesia Plan: General   Post-op Pain Management:    Induction: Intravenous  PONV Risk Score and Plan: 4 or greater and Ondansetron, Dexamethasone, Midazolam, Droperidol and Treatment may vary due to age or medical condition  Airway Management Planned: LMA  Additional Equipment:   Intra-op Plan:   Post-operative Plan: Extubation in OR  Informed Consent: I have reviewed the patients History and Physical, chart, labs and discussed the procedure including the risks, benefits and alternatives for the proposed anesthesia with the patient or authorized representative who has indicated his/her understanding and acceptance.     Dental advisory given  Plan Discussed with: CRNA and Anesthesiologist  Anesthesia Plan Comments:         Anesthesia Quick Evaluation

## 2023-12-25 NOTE — Transfer of Care (Signed)
Immediate Anesthesia Transfer of Care Note  Patient: Destiny Mitchell  Procedure(s) Performed: RIGHT BREAST SEED GUIDED LUMPECTOMY (Right: Breast)  Patient Location: PACU  Anesthesia Type:General  Level of Consciousness: awake, alert , and oriented  Airway & Oxygen Therapy: Patient Spontanous Breathing  Post-op Assessment: Report given to RN and Post -op Vital signs reviewed and stable  Post vital signs: Reviewed and stable  Last Vitals:  Vitals Value Taken Time  BP 144/90 12/25/23 1045  Temp 36.4 C 12/25/23 1043  Pulse 71 12/25/23 1046  Resp 15 12/25/23 1046  SpO2 100 % 12/25/23 1046  Vitals shown include unfiled device data.  Last Pain:  Vitals:   12/25/23 1043  TempSrc:   PainSc: 0-No pain         Complications: No notable events documented.

## 2023-12-25 NOTE — H&P (Signed)
  48 y.o. female who is seen today for breast cancer follow-up. She is doing well and has no complaints. She has some soreness around the right breast. She has no mass or any discharge. She has returned to all her normal activity at this point. She has no limitations and no upper extremity issues. Her history is significant for a lumpectomy sentinel node in January 2022. 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 The invasive ductal carcinoma went to the posterior margin but this was the fascia on the pack and I have cleared this completely. There is no gross invasion of her muscle at all. She had 2 sentinel lymph nodes that are negative. Her Oncotype is 5. She has completed radiotherapy. Taking tam. She has been followed with mr per oncology. She was undergoing a follow up for uoq nme. On 11/19 had mr that showed stable appearance of that but a developing 4 mm mass inferior to lumpectomy site. Underwent one biopsy but was not thought to have sampled had another. 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 Ecad is pending as pathology cannot definitively say if this is dcis or lcis. Have discussed with pathology who recommend waiting  Medical History: Past Medical History:  Diagnosis Date  History of cancer  Hypertension   Patient Active Problem List  Diagnosis  Malignant neoplasm of upper-outer quadrant of right breast in female, estrogen receptor positive (CMS/HHS-HCC)   Past Surgical History:  Procedure Laterality Date  DEEP AXILLARY SENTINEL NODE BIOPSY / EXCISION Right  MASTECTOMY PARTIAL / LUMPECTOMY Right    No Known Allergies  Current Outpatient Medications on File Prior to Visit  Medication Sig Dispense Refill  aliskiren-hydroCHLOROthiazide (TEKTURNA HCT) 150-25 mg tablet Take 1 tablet by mouth once daily  tamoxifen (NOLVADEX) 20 MG tablet Take 20 mg by mouth once daily   No current facility-administered medications on file prior to visit.    Family History  Problem Relation Age of Onset  Skin cancer Mother  Breast cancer Mother  Hyperlipidemia (Elevated cholesterol) Father  Coronary Artery Disease (Blocked arteries around heart) Father  Diabetes Father    Social History   Tobacco Use  Smoking Status Never  Smokeless Tobacco Never  Marital status: Married  Tobacco Use  Smoking status: Never  Smokeless tobacco: Never  Substance and Sexual Activity  Alcohol use: Never  Drug use: Never   Objective:   Physical Exam   General nad Cv regular Pulm effort normal No right breast mass  Assessment and Plan:   Right breast mass seed guided excision  Pathology is uncertain whether this is LCIS or DCIS. I am waiting on the E-cadherin to determine this. We discussed options including excisional biopsy or if it is DCIS mastectomy. We also discussed lumpectomy with reirradiation. I am leaning towards just doing excisional biopsy of this as pathology is unsure and I do not necessarily want to commit her to more until on positive she needs that. I will follow-up once pathology is back.

## 2023-12-25 NOTE — Discharge Instructions (Signed)
[facility] Office Phone Number [phone]  POST OP INSTRUCTIONS Take 400 mg of ibuprofen every 8 hours or 650 mg tylenol every 6 hours for next 72 hours then as needed. Use ice several times daily also.  A prescription for pain medication may be given to you upon discharge.  Take your pain medication as prescribed, if needed.  If narcotic pain medicine is not needed, then you may take acetaminophen (Tylenol), naprosyn (Alleve) or ibuprofen (Advil) as needed. Take your usually prescribed medications unless otherwise directed If you need a refill on your pain medication, please contact your pharmacy.  They will contact our office to request authorization.  Prescriptions will not be filled after 5pm or on week-ends. You should eat very light the first 24 hours after surgery, such as soup, crackers, pudding, etc.  Resume your normal diet the day after surgery. Most patients will experience some swelling and bruising in the breast.  Ice packs and a good support bra will help.  Wear the breast binder provided or a sports bra for 72 hours day and night.  After that wear a sports bra during the day until you return to the office. Swelling and bruising can take several days to resolve.  It is common to experience some constipation if taking pain medication after surgery.  Increasing fluid intake and taking a stool softener will usually help or prevent this problem from occurring.  A mild laxative (Milk of Magnesia or Miralax) should be taken according to package directions if there are no bowel movements after 48 hours. I used skin glue on the incision, you may shower in 24 hours.  The glue will flake off over the next 2-3 weeks.  Any sutures or staples will be removed at the office during your follow-up visit. ACTIVITIES:  You may resume regular daily activities (gradually increasing) beginning the next day.  Wearing a good support bra or sports bra minimizes pain and swelling.  You may have  sexual intercourse when it is comfortable. You may drive when you no longer are taking prescription pain medication, you can comfortably wear a seatbelt, and you can safely maneuver your car and apply brakes. RETURN TO WORK:  ______________________________________________________________________________________ Bonita Quin should see your doctor in the office for a follow-up appointment approximately two weeks after your surgery.  Your doctor's nurse will typically make your follow-up appointment when she calls you with your pathology report.  Expect your pathology report 3-4 business days after your surgery.  You may call to check if you do not hear from [place] after three days. OTHER INSTRUCTIONS: _______________________________________________________________________________________________ _____________________________________________________________________________________________________________________________________ _____________________________________________________________________________________________________________________________________ _____________________________________________________________________________________________________________________________________  WHEN TO CALL DR Shaterria Sager: Fever over 101.0 Nausea and/or vomiting. Extreme swelling or bruising. Continued bleeding from incision. Increased pain, redness, or drainage from the incision.  The clinic staff is available to answer your questions during regular business hours.  Please don't hesitate to call and ask to speak to one of the nurses for clinical concerns.  If you have a medical emergency, go to the nearest emergency room or call 911.  A surgeon from [facility] is always on call at the hospital.  For further questions, please visit [email or web address]

## 2023-12-25 NOTE — Anesthesia Procedure Notes (Signed)
Procedure Name: LMA Insertion Date/Time: 12/25/2023 9:47 AM  Performed by: Caryn Bee A, CRNAPre-anesthesia Checklist: Patient identified, Emergency Drugs available, Suction available and Patient being monitored Patient Re-evaluated:Patient Re-evaluated prior to induction Oxygen Delivery Method: Circle System Utilized Preoxygenation: Pre-oxygenation with 100% oxygen Induction Type: IV induction Ventilation: Mask ventilation without difficulty LMA: LMA inserted LMA Size: 4.0 Number of attempts: 1 Airway Equipment and Method: Bite block Placement Confirmation: positive ETCO2 Tube secured with: Tape Dental Injury: Teeth and Oropharynx as per pre-operative assessment

## 2023-12-26 ENCOUNTER — Encounter (HOSPITAL_COMMUNITY): Payer: Self-pay | Admitting: General Surgery

## 2023-12-26 ENCOUNTER — Encounter: Payer: Self-pay | Admitting: *Deleted

## 2023-12-26 LAB — SURGICAL PATHOLOGY

## 2023-12-27 ENCOUNTER — Telehealth: Payer: Self-pay | Admitting: *Deleted

## 2023-12-27 NOTE — Telephone Encounter (Signed)
Received order to send core bx to [facility] pathology for 2nd opinion. Order sent to [facility].

## 2023-12-30 ENCOUNTER — Other Ambulatory Visit (HOSPITAL_BASED_OUTPATIENT_CLINIC_OR_DEPARTMENT_OTHER): Payer: Self-pay

## 2024-01-15 ENCOUNTER — Inpatient Hospital Stay: Payer: BC Managed Care – PPO | Attending: Hematology and Oncology | Admitting: Hematology and Oncology

## 2024-01-15 ENCOUNTER — Encounter: Payer: Self-pay | Admitting: *Deleted

## 2024-01-15 DIAGNOSIS — Z170 Estrogen receptor positive status [ER+]: Secondary | ICD-10-CM | POA: Diagnosis not present

## 2024-01-15 DIAGNOSIS — C50411 Malignant neoplasm of upper-outer quadrant of right female breast: Secondary | ICD-10-CM

## 2024-01-15 NOTE — Assessment & Plan Note (Signed)
12/01/2020:Right lumpectomy Destiny Mitchell): invasive and in situ ductal carcinoma, 1.6cm, grade 2, involved posterior margin, 2 right axillary lymph nodes negative for carcinoma.  ER greater than 95%, PR greater than 95%, Ki-67 10%, HER-2 negative Oncotype DX score: 5; risk of distant recurrence at 9 years: 3% Adjuvant radiation 01/17/2021-03/03/2021   Treatment plan: Tamoxifen 20 mg daily started 03/17/2021 Tamoxifen Toxicities: Denies any major adverse effects with tamoxifen.  She is tolerating it fairly well.   Breast MRI 10/15/2023: Developing 0.4 cm oval enhancing mass inferior to the lumpectomy site UOQ right breast Biopsy 11/25/2023: DCIS involving the sclerotic lesion ER 90%, PR 100% Right lumpectomy 12/25/2023: No residual DCIS [facility] second opinion on the biopsy: [facility]  Recommendation: No additional surgery is required Continue with antiestrogen therapy

## 2024-01-15 NOTE — Progress Notes (Signed)
HEMATOLOGY-ONCOLOGY TELEPHONE VISIT PROGRESS NOTE  I connected with our patient on 01/15/24 at 11:30 AM EST by telephone and verified that I am speaking with the correct person using two identifiers.  I discussed the limitations, risks, security and privacy concerns of performing an evaluation and management service by telephone and the availability of in person appointments.  I also discussed with the patient that there may be a patient responsible charge related to this service. The patient expressed understanding and agreed to proceed.   History of Present Illness:    History of Present Illness   Destiny Mitchell is a 48 year old female with breast cancer who presents for follow-up regarding her treatment and surveillance plan.  She has been on tamoxifen for approximately three years as part of her breast cancer treatment. She is concerned about the potential for recurrence and inquires about the duration of tamoxifen therapy.  She underwent an MRI on November 19th, 2024, and a subsequent MRI-guided biopsy on December 30th, 2024. Her mammograms are typically scheduled in the spring, with the last one occurring around this time last year. She usually has her imaging done at the breast center.  She maintains a healthy lifestyle, including regular exercise, a nutritious diet, and reduced alcohol consumption.        Oncology History  Malignant neoplasm of upper-outer quadrant of right breast in female, estrogen receptor positive (HCC)  10/31/2020 Initial Diagnosis    Screening detected right breast mass 10:30 position 1.7 cm by ultrasound, numerous benign-appearing cysts largest measuring 1.3 cm.  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   11/04/2020 Cancer Staging   Staging form: Breast, AJCC 8th Edition - Clinical: Stage IA (cT1c, cN0, cM0, G2, ER+, PR+, HER2-) - Signed by Serena Croissant, MD on  11/04/2020   11/11/2020 Genetic Testing   Negative genetic testing on the STAT and Multicancer panel.  The Multi-Gene Panel offered by [facility] includes sequencing and/or deletion duplication testing of the following 85 genes: AIP, ALK, APC, ATM, AXIN2,BAP1,  BARD1, BLM, BMPR1A, BRCA1, BRCA2, BRIP1, CASR, CDC73, CDH1, CDK4, CDKN1B, CDKN1C, CDKN2A (p14ARF), CDKN2A (p16INK4a), CEBPA, CHEK2, CTNNA1, DICER1, DIS3L2, EGFR (c.2369C>T, p.Thr790Met variant only), EPCAM (Deletion/duplication testing only), FH, FLCN, GATA2, GPC3, GREM1 (Promoter region deletion/duplication testing only), HOXB13 (c.251G>A, p.Gly84Glu), HRAS, KIT, MAX, MEN1, MET, MITF (c.952G>A, p.Glu318Lys variant only), MLH1, MSH2, MSH3, MSH6, MUTYH, NBN, NF1, NF2, NTHL1, PALB2, PDGFRA, PHOX2B, PMS2, POLD1, POLE, POT1, PRKAR1A, PTCH1, PTEN, RAD50, RAD51C, RAD51D, RB1, RECQL4, RET, RNF43, RUNX1, SDHAF2, SDHA (sequence changes only), SDHB, SDHC, SDHD, SMAD4, SMARCA4, SMARCB1, SMARCE1, STK11, SUFU, TERC, TERT, TMEM127, TP53, TSC1, TSC2, VHL, WRN and WT1.  The report date is 11/13/2020.   12/01/2020 Surgery   Right lumpectomy Dwain Sarna): invasive and in situ ductal carcinoma, 1.6cm, grade 2, involved posterior margin, 2 right axillary lymph nodes negative for carcinoma.   12/16/2020 Oncotype testing   Oncotype DX score: 5; risk of distant recurrence at 9 years: 3%   01/17/2021 - 03/03/2021 Radiation Therapy   Adjuvant radiation    03/15/2021 -  Anti-estrogen oral therapy   Tamoxifen 20 mg daily     REVIEW OF SYSTEMS:   Constitutional: Denies fevers, chills or abnormal weight loss All other systems were reviewed with the patient and are negative. Observations/Objective:     Assessment Plan:  Malignant neoplasm of upper-outer quadrant of right breast in female, estrogen receptor positive (HCC) 12/01/2020:Right lumpectomy Dwain Sarna): invasive  and in situ ductal carcinoma, 1.6cm, grade 2, involved posterior margin, 2 right axillary lymph nodes  negative for carcinoma.  ER greater than 95%, PR greater than 95%, Ki-67 10%, HER-2 negative Oncotype DX score: 5; risk of distant recurrence at 9 years: 3% Adjuvant radiation 01/17/2021-03/03/2021   Treatment plan: Tamoxifen 20 mg daily started 03/17/2021 Tamoxifen Toxicities: Denies any major adverse effects with tamoxifen.  She is tolerating it fairly well.   Breast MRI 10/15/2023: Developing 0.4 cm oval enhancing mass inferior to the lumpectomy site UOQ right breast Biopsy 11/25/2023: DCIS involving the sclerotic lesion ER 90%, PR 100% Right lumpectomy 12/25/2023: No residual DCIS [facility] second opinion on the biopsy: ADH  Recommendation: No additional surgery is required Continue with antiestrogen therapy Breast cancer surveillance: Contrast-enhanced mammograms to be done in August every year.  We do not need to do any further breast MRIs.  The contrast-enhanced mammogram will replace annual mammograms.   I discussed the assessment and treatment plan with the patient. The patient was provided an opportunity to ask questions and all were answered. The patient agreed with the plan and demonstrated an understanding of the instructions. The patient was advised to call back or seek an in-person evaluation if the symptoms worsen or if the condition fails to improve as anticipated.   I provided 20 minutes of non-face-to-face time during this encounter.  This includes time for charting and coordination of care   Tamsen Meek, MD

## 2024-01-16 ENCOUNTER — Telehealth: Payer: Self-pay | Admitting: Hematology and Oncology

## 2024-01-16 DIAGNOSIS — Z9889 Other specified postprocedural states: Secondary | ICD-10-CM | POA: Insufficient documentation

## 2024-01-16 NOTE — Telephone Encounter (Signed)
Scheduled appointment per 2/19 los. Left VM with appointment details.

## 2024-01-20 ENCOUNTER — Telehealth: Payer: Self-pay | Admitting: Adult Health

## 2024-01-20 NOTE — Telephone Encounter (Signed)
 Marland Kitchen

## 2024-01-23 ENCOUNTER — Telehealth: Payer: Self-pay | Admitting: Adult Health

## 2024-01-23 NOTE — Telephone Encounter (Signed)
 Canceled appointment per patient request. Patient stated she doesn't need the appointment.

## 2024-01-29 ENCOUNTER — Other Ambulatory Visit (HOSPITAL_BASED_OUTPATIENT_CLINIC_OR_DEPARTMENT_OTHER): Payer: Self-pay

## 2024-01-29 ENCOUNTER — Other Ambulatory Visit: Payer: Self-pay

## 2024-01-29 ENCOUNTER — Other Ambulatory Visit (HOSPITAL_COMMUNITY): Payer: Self-pay

## 2024-01-30 ENCOUNTER — Other Ambulatory Visit (HOSPITAL_BASED_OUTPATIENT_CLINIC_OR_DEPARTMENT_OTHER): Payer: Self-pay

## 2024-01-30 MED ORDER — PROPRANOLOL HCL 10 MG PO TABS
10.0000 mg | ORAL_TABLET | Freq: Two times a day (BID) | ORAL | 3 refills | Status: DC
  Filled 2024-01-30: qty 60, 30d supply, fill #0
  Filled 2024-03-04: qty 60, 30d supply, fill #1
  Filled 2024-04-08: qty 60, 30d supply, fill #2
  Filled 2024-05-05: qty 60, 30d supply, fill #3

## 2024-02-24 DIAGNOSIS — D2262 Melanocytic nevi of left upper limb, including shoulder: Secondary | ICD-10-CM | POA: Diagnosis not present

## 2024-02-24 DIAGNOSIS — L245 Irritant contact dermatitis due to other chemical products: Secondary | ICD-10-CM | POA: Diagnosis not present

## 2024-02-24 DIAGNOSIS — D225 Melanocytic nevi of trunk: Secondary | ICD-10-CM | POA: Diagnosis not present

## 2024-02-24 DIAGNOSIS — Z85828 Personal history of other malignant neoplasm of skin: Secondary | ICD-10-CM | POA: Diagnosis not present

## 2024-03-04 ENCOUNTER — Other Ambulatory Visit (HOSPITAL_BASED_OUTPATIENT_CLINIC_OR_DEPARTMENT_OTHER): Payer: Self-pay

## 2024-03-06 ENCOUNTER — Encounter: Payer: Self-pay | Admitting: Hematology and Oncology

## 2024-03-10 ENCOUNTER — Encounter: Payer: Self-pay | Admitting: *Deleted

## 2024-03-12 DIAGNOSIS — Z130 Encounter for screening for diseases of the blood and blood-forming organs and certain disorders involving the immune mechanism: Secondary | ICD-10-CM | POA: Diagnosis not present

## 2024-03-12 DIAGNOSIS — Z01419 Encounter for gynecological examination (general) (routine) without abnormal findings: Secondary | ICD-10-CM | POA: Diagnosis not present

## 2024-03-20 DIAGNOSIS — H04123 Dry eye syndrome of bilateral lacrimal glands: Secondary | ICD-10-CM | POA: Diagnosis not present

## 2024-03-20 DIAGNOSIS — H5213 Myopia, bilateral: Secondary | ICD-10-CM | POA: Diagnosis not present

## 2024-04-08 ENCOUNTER — Other Ambulatory Visit (HOSPITAL_BASED_OUTPATIENT_CLINIC_OR_DEPARTMENT_OTHER): Payer: Self-pay

## 2024-04-17 ENCOUNTER — Encounter: Payer: BC Managed Care – PPO | Admitting: Adult Health

## 2024-05-05 ENCOUNTER — Other Ambulatory Visit (HOSPITAL_COMMUNITY): Payer: Self-pay

## 2024-05-05 ENCOUNTER — Encounter: Payer: Self-pay | Admitting: Hematology and Oncology

## 2024-05-05 ENCOUNTER — Other Ambulatory Visit: Payer: Self-pay

## 2024-05-06 ENCOUNTER — Other Ambulatory Visit: Payer: Self-pay

## 2024-05-25 ENCOUNTER — Other Ambulatory Visit (HOSPITAL_BASED_OUTPATIENT_CLINIC_OR_DEPARTMENT_OTHER): Payer: Self-pay

## 2024-05-25 MED ORDER — CYCLOSPORINE 0.05 % OP EMUL
1.0000 [drp] | Freq: Two times a day (BID) | OPHTHALMIC | 4 refills | Status: AC
  Filled 2024-05-25: qty 60, 30d supply, fill #0
  Filled 2024-08-11: qty 60, 30d supply, fill #1
  Filled 2024-09-16: qty 60, 30d supply, fill #2
  Filled 2024-10-31 – 2024-12-13 (×3): qty 60, 30d supply, fill #3
  Filled 2024-12-29: qty 180, 90d supply, fill #3

## 2024-05-28 ENCOUNTER — Other Ambulatory Visit (HOSPITAL_BASED_OUTPATIENT_CLINIC_OR_DEPARTMENT_OTHER): Payer: Self-pay

## 2024-06-01 ENCOUNTER — Other Ambulatory Visit (HOSPITAL_BASED_OUTPATIENT_CLINIC_OR_DEPARTMENT_OTHER): Payer: Self-pay

## 2024-06-03 ENCOUNTER — Other Ambulatory Visit (HOSPITAL_BASED_OUTPATIENT_CLINIC_OR_DEPARTMENT_OTHER): Payer: Self-pay

## 2024-06-03 ENCOUNTER — Other Ambulatory Visit: Payer: Self-pay

## 2024-06-03 MED ORDER — PROPRANOLOL HCL 10 MG PO TABS
10.0000 mg | ORAL_TABLET | Freq: Two times a day (BID) | ORAL | 3 refills | Status: DC
  Filled 2024-06-03: qty 60, 30d supply, fill #0
  Filled 2024-07-07: qty 60, 30d supply, fill #1
  Filled 2024-08-11: qty 60, 30d supply, fill #2
  Filled 2024-09-15: qty 60, 30d supply, fill #3

## 2024-06-06 ENCOUNTER — Other Ambulatory Visit (HOSPITAL_BASED_OUTPATIENT_CLINIC_OR_DEPARTMENT_OTHER): Payer: Self-pay

## 2024-07-07 ENCOUNTER — Other Ambulatory Visit (HOSPITAL_BASED_OUTPATIENT_CLINIC_OR_DEPARTMENT_OTHER): Payer: Self-pay

## 2024-07-15 ENCOUNTER — Other Ambulatory Visit (HOSPITAL_BASED_OUTPATIENT_CLINIC_OR_DEPARTMENT_OTHER): Payer: Self-pay

## 2024-07-15 ENCOUNTER — Inpatient Hospital Stay: Payer: BC Managed Care – PPO | Attending: Hematology and Oncology | Admitting: Hematology and Oncology

## 2024-07-15 VITALS — BP 141/87 | HR 66 | Temp 98.1°F | Resp 17 | Wt 159.1 lb

## 2024-07-15 DIAGNOSIS — Z79810 Long term (current) use of selective estrogen receptor modulators (SERMs): Secondary | ICD-10-CM | POA: Insufficient documentation

## 2024-07-15 DIAGNOSIS — Z79899 Other long term (current) drug therapy: Secondary | ICD-10-CM | POA: Diagnosis not present

## 2024-07-15 DIAGNOSIS — Z1721 Progesterone receptor positive status: Secondary | ICD-10-CM | POA: Diagnosis not present

## 2024-07-15 DIAGNOSIS — Z923 Personal history of irradiation: Secondary | ICD-10-CM | POA: Diagnosis not present

## 2024-07-15 DIAGNOSIS — Z170 Estrogen receptor positive status [ER+]: Secondary | ICD-10-CM | POA: Diagnosis not present

## 2024-07-15 DIAGNOSIS — Z17411 Hormone receptor positive with human epidermal growth factor receptor 2 negative status: Secondary | ICD-10-CM | POA: Insufficient documentation

## 2024-07-15 DIAGNOSIS — C50411 Malignant neoplasm of upper-outer quadrant of right female breast: Secondary | ICD-10-CM | POA: Insufficient documentation

## 2024-07-15 DIAGNOSIS — Z793 Long term (current) use of hormonal contraceptives: Secondary | ICD-10-CM | POA: Insufficient documentation

## 2024-07-15 MED ORDER — TAMOXIFEN CITRATE 20 MG PO TABS
20.0000 mg | ORAL_TABLET | Freq: Every day | ORAL | 3 refills | Status: AC
  Filled 2024-07-15: qty 90, 90d supply, fill #0
  Filled 2024-08-11: qty 30, 30d supply, fill #0
  Filled 2024-09-15: qty 30, 30d supply, fill #1
  Filled 2024-10-19: qty 30, 30d supply, fill #2
  Filled 2024-11-17: qty 30, 30d supply, fill #3
  Filled 2024-12-13: qty 90, 90d supply, fill #4

## 2024-07-15 NOTE — Progress Notes (Signed)
 Patient Care Team: Avva, Ravisankar, MD as PCP - General (Internal Medicine) Odean Potts, MD as Consulting Physician (Hematology and Oncology) Ebbie Cough, MD as Consulting Physician (General Surgery) Shannon Agent, MD as Consulting Physician (Radiation Oncology) Crawford Morna Pickle, NP as Nurse Practitioner (Hematology and Oncology) Dove, Natro D, RN as Registered Nurse  DIAGNOSIS:  Encounter Diagnosis  Name Primary?   Malignant neoplasm of upper-outer quadrant of right breast in female, estrogen receptor positive (HCC) Yes    SUMMARY OF ONCOLOGIC HISTORY: Oncology History  Malignant neoplasm of upper-outer quadrant of right breast in female, estrogen receptor positive (HCC)  10/31/2020 Initial Diagnosis    Screening detected right breast mass 10:30 position 1.7 cm by ultrasound, numerous benign-appearing cysts largest measuring 1.3 cm.  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   11/04/2020 Cancer Staging   Staging form: Breast, AJCC 8th Edition - Clinical: Stage IA (cT1c, cN0, cM0, G2, ER+, PR+, HER2-) - Signed by Odean Potts, MD on 11/04/2020   11/11/2020 Genetic Testing   Negative genetic testing on the STAT and Multicancer panel.  The Multi-Gene Panel offered by [facility] includes sequencing and/or deletion duplication testing of the following 85 genes: AIP, ALK, APC, ATM, AXIN2,BAP1,  BARD1, BLM, BMPR1A, BRCA1, BRCA2, BRIP1, CASR, CDC73, CDH1, CDK4, CDKN1B, CDKN1C, CDKN2A (p14ARF), CDKN2A (p16INK4a), CEBPA, CHEK2, CTNNA1, DICER1, DIS3L2, EGFR (c.2369C>T, p.Thr790Met variant only), EPCAM (Deletion/duplication testing only), FH, FLCN, GATA2, GPC3, GREM1 (Promoter region deletion/duplication testing only), HOXB13 (c.251G>A, p.Gly84Glu), HRAS, KIT, MAX, MEN1, MET, MITF (c.952G>A, p.Glu318Lys variant only), MLH1, MSH2, MSH3, MSH6, MUTYH, NBN, NF1, NF2, NTHL1, PALB2, PDGFRA, PHOX2B,  PMS2, POLD1, POLE, POT1, PRKAR1A, PTCH1, PTEN, RAD50, RAD51C, RAD51D, RB1, RECQL4, RET, RNF43, RUNX1, SDHAF2, SDHA (sequence changes only), SDHB, SDHC, SDHD, SMAD4, SMARCA4, SMARCB1, SMARCE1, STK11, SUFU, TERC, TERT, TMEM127, TP53, TSC1, TSC2, VHL, WRN and WT1.  The report date is 11/13/2020.   12/01/2020 Surgery   Right lumpectomy Viktoria): invasive and in situ ductal carcinoma, 1.6cm, grade 2, involved posterior margin, 2 right axillary lymph nodes negative for carcinoma.   12/16/2020 Oncotype testing   Oncotype DX score: 5; risk of distant recurrence at 9 years: 3%   01/17/2021 - 03/03/2021 Radiation Therapy   Adjuvant radiation    03/15/2021 -  Anti-estrogen oral therapy   Tamoxifen  20 mg daily     CHIEF COMPLIANT: Follow-up on tamoxifen  therapy  HISTORY OF PRESENT ILLNESS:   History of Present Illness Destiny Mitchell is a 48 year old female with breast cancer who presents for follow-up regarding tamoxifen  side effects and weight gain.  She has been on tamoxifen  since April 2022 after completing radiation therapy. She experiences hot flashes, mood changes, and backaches. She is concerned about potential blood clots.  She has gained 10 to 15 pounds over the last eight months despite regular exercise and dietary awareness. Her weight was stable prior to starting tamoxifen . She attributes some fluctuations to stress and a surgery in January.  She is scheduled for a contrast mammogram tomorrow, which involves an IV iodine contrast and a 2D mammogram image.     ALLERGIES:  has no known allergies.  MEDICATIONS:  Current Outpatient Medications  Medication Sig Dispense Refill   cycloSPORINE  (RESTASIS ) 0.05 % ophthalmic emulsion INSTILL 1 DROP IN BOTH EYES TWICE DAILY. 180 each 4   hydrochlorothiazide  (HYDRODIURIL ) 25 MG tablet Take 1 tablet (25 mg total) by mouth every morning. 90 tablet 3  levonorgestrel  (LILETTA ) 20.1 MCG/DAY IUD Liletta  20.1 mcg/24 hrs (6 yrs) 52 mg intrauterine  device  Take 1 insert by intrauterine route.     propranolol  (INDERAL ) 10 MG tablet Take 1 tablet (10 mg total) by mouth every 12 (twelve) hours on an empty stomach. 60 tablet 3   tamoxifen  (NOLVADEX ) 20 MG tablet Take 1 tablet (20 mg total) by mouth daily. 90 tablet 3   No current facility-administered medications for this visit.    PHYSICAL EXAMINATION: ECOG PERFORMANCE STATUS: 1 - Symptomatic but completely ambulatory  Vitals:   07/15/24 1101  BP: (!) 141/87  Pulse: 66  Resp: 17  Temp: 98.1 F (36.7 C)  SpO2: 100%   Filed Weights   07/15/24 1101  Weight: 159 lb 1.6 oz (72.2 kg)      LABORATORY DATA:  I have reviewed the data as listed    Latest Ref Rng & Units 12/20/2023    4:00 PM 11/24/2020    3:32 PM 04/07/2018    8:17 AM  CMP  Glucose 70 - 99 mg/dL 887  889  86   BUN 6 - 20 mg/dL 23  23  17    Creatinine 0.44 - 1.00 mg/dL 8.99  9.18  9.07   Sodium 135 - 145 mmol/L 138  137  139   Potassium 3.5 - 5.1 mmol/L 3.8  3.2  4.0   Chloride 98 - 111 mmol/L 100  101  100   CO2 22 - 32 mmol/L 29  26  25    Calcium  8.9 - 10.3 mg/dL 9.5  9.8  9.2   Total Protein 6.0 - 8.5 g/dL   7.1   Total Bilirubin 0.0 - 1.2 mg/dL   0.6   Alkaline Phos 39 - 117 IU/L   64   AST 0 - 40 IU/L   17   ALT 0 - 32 IU/L   13     Lab Results  Component Value Date   WBC 5.2 12/25/2023   HGB 15.1 (H) 12/25/2023   HCT 44.9 12/25/2023   MCV 85.5 12/25/2023   PLT 201 12/25/2023   NEUTROABS 2.7 04/07/2018    ASSESSMENT & PLAN:  Malignant neoplasm of upper-outer quadrant of right breast in female, estrogen receptor positive (HCC) 12/01/2020:Right lumpectomy Viktoria): invasive and in situ ductal carcinoma, 1.6cm, grade 2, involved posterior margin, 2 right axillary lymph nodes negative for carcinoma.  ER greater than 95%, PR greater than 95%, Ki-67 10%, HER-2 negative Oncotype DX score: 5; risk of distant recurrence at 9 years: 3% Adjuvant radiation 01/17/2021-03/03/2021   Treatment plan:  Tamoxifen  20 mg daily started 03/17/2021 Tamoxifen  Toxicities: Denies any major adverse effects with tamoxifen .  She is tolerating it fairly well.   Breast MRI 10/15/2023: Developing 0.4 cm oval enhancing mass inferior to the lumpectomy site UOQ right breast Biopsy 11/25/2023: DCIS involving the sclerotic lesion ER 90%, PR 100% Right lumpectomy 12/25/2023: No residual DCIS [facility] second opinion on the biopsy: ADH   Recommendation: No additional surgery is required Continue with antiestrogen therapy Breast cancer surveillance: Contrast-enhanced mammogram scheduled for 07/16/2024.  This will be done annually Breast exam 07/15/2024: Benign  Tamoxifen  toxicities: Easy irritability Weight gain We discussed with her that she will finish 5 years next year We will do a breast cancer index to determine if she would benefit from external endocrine therapy. If she does need to be on 5 years we will discuss reducing the dosage to 10 mg a day.  Return to clinic in 1  year for follow-up ------------------------------------- Assessment and Plan Assessment & Plan Estrogen receptor positive right breast cancer, status post surgery and radiation, on adjuvant tamoxifen  Currently on adjuvant tamoxifen  since April 2022. Plan to continue for at least five years. BCI test planned at five-year mark to assess therapy duration. Test deferred to utilize future technological advancements. - Continue tamoxifen  20 mg oral daily. - Plan for Breast Cancer Index (BCI) test at five-year mark to evaluate need for continued therapy.  Adverse effects of tamoxifen  therapy (hot flashes, mood changes, weight gain) Reports hot flashes, mood changes, and weight gain attributed to tamoxifen  and stress. Discussed dose reduction but not recommended due to insufficient data for low-dose efficacy in invasive cancer. - Continue current tamoxifen  dose for one more year. - Reassess the need for dose reduction after one year.      No  orders of the defined types were placed in this encounter.  The patient has a good understanding of the overall plan. she agrees with it. she will call with any problems that may develop before the next visit here. Total time spent: 30 mins including face to face time and time spent for planning, charting and co-ordination of care   Naomi MARLA Chad, MD 07/15/24

## 2024-07-15 NOTE — Assessment & Plan Note (Signed)
 12/01/2020:Right lumpectomy Destiny Mitchell): invasive and in situ ductal carcinoma, 1.6cm, grade 2, involved posterior margin, 2 right axillary lymph nodes negative for carcinoma.  ER greater than 95%, PR greater than 95%, Ki-67 10%, HER-2 negative Oncotype DX score: 5; risk of distant recurrence at 9 years: 3% Adjuvant radiation 01/17/2021-03/03/2021   Treatment plan: Tamoxifen  20 mg daily started 03/17/2021 Tamoxifen  Toxicities: Denies any major adverse effects with tamoxifen .  She is tolerating it fairly well.   Breast MRI 10/15/2023: Developing 0.4 cm oval enhancing mass inferior to the lumpectomy site UOQ right breast Biopsy 11/25/2023: DCIS involving the sclerotic lesion ER 90%, PR 100% Right lumpectomy 12/25/2023: No residual DCIS [facility] second opinion on the biopsy: ADH   Recommendation: No additional surgery is required Continue with antiestrogen therapy Breast cancer surveillance: Contrast-enhanced mammogram scheduled for 07/16/2024.  This will be done annually Breast exam 07/15/2024: Benign  Return to clinic in 1 year for follow-up

## 2024-07-16 ENCOUNTER — Ambulatory Visit
Admission: RE | Admit: 2024-07-16 | Discharge: 2024-07-16 | Disposition: A | Discharge status: Home / Self Care | Source: Ambulatory Visit | Attending: Hematology and Oncology | Admitting: Hematology and Oncology

## 2024-07-16 DIAGNOSIS — R921 Mammographic calcification found on diagnostic imaging of breast: Secondary | ICD-10-CM | POA: Diagnosis not present

## 2024-07-16 DIAGNOSIS — Z170 Estrogen receptor positive status [ER+]: Secondary | ICD-10-CM

## 2024-07-16 MED ORDER — IOPAMIDOL (ISOVUE-370) INJECTION 76%
100.0000 mL | Freq: Once | INTRAVENOUS | Status: AC | PRN
  Administered 2024-07-16: 100 mL via INTRAVENOUS

## 2024-08-11 ENCOUNTER — Ambulatory Visit (INDEPENDENT_AMBULATORY_CARE_PROVIDER_SITE_OTHER): Admitting: Podiatry

## 2024-08-11 ENCOUNTER — Encounter: Payer: Self-pay | Admitting: Podiatry

## 2024-08-11 ENCOUNTER — Other Ambulatory Visit: Payer: Self-pay

## 2024-08-11 ENCOUNTER — Other Ambulatory Visit (HOSPITAL_BASED_OUTPATIENT_CLINIC_OR_DEPARTMENT_OTHER): Payer: Self-pay

## 2024-08-11 DIAGNOSIS — N912 Amenorrhea, unspecified: Secondary | ICD-10-CM | POA: Insufficient documentation

## 2024-08-11 DIAGNOSIS — L603 Nail dystrophy: Secondary | ICD-10-CM | POA: Diagnosis not present

## 2024-08-11 DIAGNOSIS — O26849 Uterine size-date discrepancy, unspecified trimester: Secondary | ICD-10-CM | POA: Insufficient documentation

## 2024-08-11 MED ORDER — TERBINAFINE HCL 250 MG PO TABS
250.0000 mg | ORAL_TABLET | Freq: Every day | ORAL | 0 refills | Status: AC
  Filled 2024-08-11: qty 30, 30d supply, fill #0

## 2024-08-12 NOTE — Progress Notes (Signed)
 Subjective:  Patient ID: Destiny Mitchell, female    DOB: 06-22-1976,  MRN: [id number] HPI Chief Complaint  Patient presents with   Nail Problem    Fungal nails - re-evaluate, took lamisil  in 2018   New Patient (Initial Visit)    48 y.o. female presents with the above complaint.   ROS: Denies fever chills nausea mobic muscle aches pains calf pain back pain chest pain shortness of breath.  Past Medical History:  Diagnosis Date   Anxiety attack    Back pain    Breast cancer (HCC) 11/2020   Left breast   Chronic kidney disease    Complication of anesthesia    Family history of breast cancer    mom at age 64   Family history of colon cancer    Family history of kidney cancer    Family history of lung cancer    Family history of pancreatic cancer    Family history of prostate cancer    Family history of stomach cancer    History of radiation therapy 01/16/2021-03/03/2021   IMRT to right breast    Dr Lynwood Nasuti   Palpitations    Personal history of radiation therapy    PONV (postoperative nausea and vomiting)    Pre-eclampsia, postpartum    Sinus tachycardia    SVD (spontaneous vaginal delivery) 06/22/2014   Syncope and collapse    Ureter obstruction    s/p surgery in 2009   Past Surgical History:  Procedure Laterality Date   BREAST BIOPSY  12/24/2023   MM RT RADIOACTIVE SEED LOC MAMMO GUIDE 12/24/2023 [facility] MAMMOGRAPHY   BREAST LUMPECTOMY Left 12/01/2020   w/ radiation. no chemo. 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 NORMAL   WISDOM TOOTH EXTRACTION      Current Outpatient Medications:    terbinafine  (LAMISIL ) 250 MG tablet, Take 1 tablet (250 mg total) by mouth daily., Disp: 30 tablet, Rfl: 0   cycloSPORINE  (RESTASIS ) 0.05 % ophthalmic emulsion, INSTILL 1 DROP IN BOTH EYES TWICE DAILY., Disp: 180 each, Rfl: 4   hydrochlorothiazide  (HYDRODIURIL ) 25 MG tablet, Take 1 tablet (25 mg total) by mouth every morning., Disp: 90 tablet, Rfl: 3   levonorgestrel  (LILETTA ) 20.1 MCG/DAY IUD, Liletta  20.1 mcg/24 hrs (6 yrs) 52 mg intrauterine device  Take 1 insert by intrauterine route., Disp: , Rfl:    propranolol  (INDERAL ) 10 MG tablet, Take 1 tablet (10 mg total) by mouth every 12 (twelve) hours on an empty stomach., Disp: 60 tablet, Rfl: 3   tamoxifen  (NOLVADEX ) 20 MG tablet, Take 1 tablet (20 mg total) by mouth daily., Disp: 90 tablet, Rfl: 3  No Known Allergies Review of Systems Objective:  There were no vitals filed for this visit.  General: Well developed, nourished, in no acute distress, alert and oriented x3   Dermatological: Skin is warm, dry and supple bilateral. Nails x 10 thick yellow dystrophic clinically mycotic  third nail left is raised off the bed.  There are no open sores, no preulcerative lesions, demonstrates signs of tinea pedis to the left foot. Vascular: Dorsalis Pedis artery and Posterior Tibial artery pedal pulses are 2/4 bilateral with immedate capillary fill time. Pedal hair growth present. No varicosities and no lower extremity edema present bilateral.   Neruologic: Grossly intact via light touch bilateral. Vibratory intact via tuning fork bilateral. Protective threshold with Semmes Wienstein monofilament intact to all pedal sites bilateral. Patellar and Achilles deep tendon reflexes 2+ bilateral. No Babinski or clonus noted bilateral.    Musculoskeletal: No gross boney pedal deformities bilateral. No pain, crepitus, or limitation noted with foot and ankle range of motion bilateral. Muscular strength 5/5 in all groups tested bilateral.  Gait: Unassisted, Nonantalgic.    Radiographs:  None taken  Assessment & Plan:   Assessment: Tinea pedis and onychomycosis  Plan: Started her back on Lamisil  which she has taken before without complication.  She will take 1 tablet 250 mg daily.  I will follow-up with her in 1 month for blood work.  30 tablets were dispensed     Shravan Salahuddin T. [place], [place]

## 2024-08-14 ENCOUNTER — Other Ambulatory Visit (HOSPITAL_BASED_OUTPATIENT_CLINIC_OR_DEPARTMENT_OTHER): Payer: Self-pay

## 2024-08-15 ENCOUNTER — Other Ambulatory Visit: Payer: Self-pay

## 2024-08-25 DIAGNOSIS — D2261 Melanocytic nevi of right upper limb, including shoulder: Secondary | ICD-10-CM | POA: Diagnosis not present

## 2024-08-25 DIAGNOSIS — D225 Melanocytic nevi of trunk: Secondary | ICD-10-CM | POA: Diagnosis not present

## 2024-08-25 DIAGNOSIS — Z85828 Personal history of other malignant neoplasm of skin: Secondary | ICD-10-CM | POA: Diagnosis not present

## 2024-08-25 DIAGNOSIS — D2262 Melanocytic nevi of left upper limb, including shoulder: Secondary | ICD-10-CM | POA: Diagnosis not present

## 2024-08-26 ENCOUNTER — Other Ambulatory Visit: Payer: Self-pay | Admitting: Medical Genetics

## 2024-08-31 ENCOUNTER — Other Ambulatory Visit (HOSPITAL_BASED_OUTPATIENT_CLINIC_OR_DEPARTMENT_OTHER): Payer: Self-pay

## 2024-08-31 MED ORDER — AZITHROMYCIN 250 MG PO TABS
ORAL_TABLET | ORAL | 0 refills | Status: AC
  Filled 2024-08-31: qty 6, 5d supply, fill #0

## 2024-09-15 ENCOUNTER — Encounter: Payer: Self-pay | Admitting: Podiatry

## 2024-09-15 ENCOUNTER — Other Ambulatory Visit: Payer: Self-pay | Admitting: Lab

## 2024-09-15 ENCOUNTER — Other Ambulatory Visit (HOSPITAL_BASED_OUTPATIENT_CLINIC_OR_DEPARTMENT_OTHER): Payer: Self-pay

## 2024-09-15 ENCOUNTER — Ambulatory Visit (INDEPENDENT_AMBULATORY_CARE_PROVIDER_SITE_OTHER): Admitting: Podiatry

## 2024-09-15 ENCOUNTER — Other Ambulatory Visit: Payer: Self-pay

## 2024-09-15 DIAGNOSIS — Z79899 Other long term (current) drug therapy: Secondary | ICD-10-CM

## 2024-09-15 DIAGNOSIS — L603 Nail dystrophy: Secondary | ICD-10-CM

## 2024-09-15 LAB — COMPREHENSIVE METABOLIC PANEL WITH GFR
AG Ratio: 1.6 (calc) (ref 1.0–2.5)
ALT: 13 U/L (ref 6–29)
AST: 16 U/L (ref 10–35)
Albumin: 4.3 g/dL (ref 3.6–5.1)
Alkaline phosphatase (APISO): 44 U/L (ref 31–125)
BUN: 19 mg/dL (ref 7–25)
CO2: 32 mmol/L (ref 20–32)
Calcium: 9.5 mg/dL (ref 8.6–10.2)
Chloride: 100 mmol/L (ref 98–110)
Creat: 0.91 mg/dL (ref 0.50–0.99)
Globulin: 2.7 g/dL (ref 1.9–3.7)
Glucose, Bld: 91 mg/dL (ref 65–139)
Potassium: 4 mmol/L (ref 3.5–5.3)
Sodium: 139 mmol/L (ref 135–146)
Total Bilirubin: 0.4 mg/dL (ref 0.2–1.2)
Total Protein: 7 g/dL (ref 6.1–8.1)
eGFR: 79 mL/min/1.73m2 (ref >=60)

## 2024-09-15 MED ORDER — TERBINAFINE HCL 250 MG PO TABS
250.0000 mg | ORAL_TABLET | Freq: Every day | ORAL | 0 refills | Status: AC
  Filled 2024-09-15: qty 30, 30d supply, fill #0
  Filled 2024-10-19: qty 30, 30d supply, fill #1
  Filled 2024-11-17: qty 30, 30d supply, fill #2

## 2024-09-16 ENCOUNTER — Other Ambulatory Visit (HOSPITAL_BASED_OUTPATIENT_CLINIC_OR_DEPARTMENT_OTHER): Payer: Self-pay

## 2024-09-16 ENCOUNTER — Ambulatory Visit: Payer: Self-pay | Admitting: Podiatry

## 2024-09-16 NOTE — Progress Notes (Signed)
 She presents today for follow-up of her Lamisil  therapy states that she has had noted fever chills nausea muscle aches pains calf pain back pain chest pain shortness of breath with it.  States that the nails relatively unchanged.  Objective: Onychomycosis still present.  Tinea appears to be resolving.  Long-term therapy with Lamisil  for onychomycosis.  Plan: Dispensed another 90 days of Lamisil  and dispensed a comprehensive metabolic panel requisition.  She does come back abnormal we will notify her immediately.  Otherwise I will follow-up with her in 4 months

## 2024-09-17 ENCOUNTER — Other Ambulatory Visit (HOSPITAL_BASED_OUTPATIENT_CLINIC_OR_DEPARTMENT_OTHER): Payer: Self-pay

## 2024-10-19 ENCOUNTER — Other Ambulatory Visit (HOSPITAL_BASED_OUTPATIENT_CLINIC_OR_DEPARTMENT_OTHER): Payer: Self-pay

## 2024-10-19 ENCOUNTER — Other Ambulatory Visit: Payer: Self-pay

## 2024-10-19 MED ORDER — HYDROCHLOROTHIAZIDE 25 MG PO TABS
25.0000 mg | ORAL_TABLET | Freq: Every morning | ORAL | 3 refills | Status: AC
  Filled 2024-10-19: qty 90, 90d supply, fill #0

## 2024-10-19 MED ORDER — PROPRANOLOL HCL 10 MG PO TABS
10.0000 mg | ORAL_TABLET | Freq: Three times a day (TID) | ORAL | 3 refills | Status: AC | PRN
  Filled 2024-10-19: qty 30, 5d supply, fill #0

## 2024-10-23 ENCOUNTER — Other Ambulatory Visit (HOSPITAL_BASED_OUTPATIENT_CLINIC_OR_DEPARTMENT_OTHER): Payer: Self-pay

## 2024-10-31 ENCOUNTER — Other Ambulatory Visit (HOSPITAL_COMMUNITY): Payer: Self-pay

## 2024-11-04 ENCOUNTER — Other Ambulatory Visit (HOSPITAL_BASED_OUTPATIENT_CLINIC_OR_DEPARTMENT_OTHER): Payer: Self-pay

## 2024-11-11 ENCOUNTER — Other Ambulatory Visit (HOSPITAL_BASED_OUTPATIENT_CLINIC_OR_DEPARTMENT_OTHER): Payer: Self-pay

## 2024-11-11 MED ORDER — PROPRANOLOL HCL 10 MG PO TABS
10.0000 mg | ORAL_TABLET | Freq: Three times a day (TID) | ORAL | 3 refills | Status: AC | PRN
  Filled 2024-11-11: qty 60, 10d supply, fill #0
  Filled 2024-12-13: qty 60, 10d supply, fill #1

## 2024-11-17 ENCOUNTER — Other Ambulatory Visit (HOSPITAL_BASED_OUTPATIENT_CLINIC_OR_DEPARTMENT_OTHER): Payer: Self-pay

## 2024-11-18 ENCOUNTER — Other Ambulatory Visit (HOSPITAL_BASED_OUTPATIENT_CLINIC_OR_DEPARTMENT_OTHER): Payer: Self-pay

## 2024-12-02 ENCOUNTER — Other Ambulatory Visit (HOSPITAL_COMMUNITY): Payer: Self-pay

## 2024-12-02 ENCOUNTER — Other Ambulatory Visit: Payer: Self-pay

## 2024-12-03 ENCOUNTER — Other Ambulatory Visit (HOSPITAL_COMMUNITY): Payer: Self-pay

## 2024-12-03 ENCOUNTER — Other Ambulatory Visit: Payer: Self-pay

## 2024-12-13 ENCOUNTER — Other Ambulatory Visit (HOSPITAL_BASED_OUTPATIENT_CLINIC_OR_DEPARTMENT_OTHER): Payer: Self-pay

## 2024-12-14 ENCOUNTER — Other Ambulatory Visit: Payer: Self-pay

## 2024-12-29 ENCOUNTER — Other Ambulatory Visit (HOSPITAL_BASED_OUTPATIENT_CLINIC_OR_DEPARTMENT_OTHER): Payer: Self-pay

## 2024-12-29 ENCOUNTER — Other Ambulatory Visit: Payer: Self-pay | Admitting: Podiatry

## 2025-01-19 ENCOUNTER — Ambulatory Visit: Admitting: Podiatry

## 2025-08-16 ENCOUNTER — Ambulatory Visit: Admitting: Hematology and Oncology
